# Patient Record
Sex: Female | Born: 1945
Health system: Southern US, Community
[De-identification: ages and names within clinical notes are randomized; demographics above are authoritative.]

## PROBLEM LIST (undated history)

## (undated) DIAGNOSIS — L900 Lichen sclerosus et atrophicus: Secondary | ICD-10-CM

## (undated) DIAGNOSIS — Z973 Presence of spectacles and contact lenses: Secondary | ICD-10-CM

## (undated) DIAGNOSIS — E785 Hyperlipidemia, unspecified: Secondary | ICD-10-CM

## (undated) DIAGNOSIS — M170 Bilateral primary osteoarthritis of knee: Secondary | ICD-10-CM

## (undated) DIAGNOSIS — I872 Venous insufficiency (chronic) (peripheral): Secondary | ICD-10-CM

## (undated) HISTORY — DX: Venous insufficiency (chronic) (peripheral): I87.2

## (undated) HISTORY — PX: JOINT REPLACEMENT: SHX530

## (undated) HISTORY — PX: VARICOSE VEIN SURGERY: SHX832

## (undated) HISTORY — DX: Hyperlipidemia, unspecified: E78.5

## (undated) HISTORY — PX: OTHER SURGICAL HISTORY: SHX169

---
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
## 1988-03-08 HISTORY — PX: DILATION AND CURETTAGE OF UTERUS: SHX78

## 2010-08-07 LAB — HM DEXA SCAN: HM Dexa Scan: NORMAL

## 2011-03-12 DIAGNOSIS — H5210 Myopia, unspecified eye: Secondary | ICD-10-CM | POA: Diagnosis not present

## 2011-04-12 DIAGNOSIS — H43819 Vitreous degeneration, unspecified eye: Secondary | ICD-10-CM | POA: Diagnosis not present

## 2011-05-07 HISTORY — PX: KNEE ARTHROSCOPY: SUR90

## 2011-05-12 DIAGNOSIS — L819 Disorder of pigmentation, unspecified: Secondary | ICD-10-CM | POA: Diagnosis not present

## 2011-05-12 DIAGNOSIS — D1800 Hemangioma unspecified site: Secondary | ICD-10-CM | POA: Diagnosis not present

## 2011-05-12 DIAGNOSIS — L821 Other seborrheic keratosis: Secondary | ICD-10-CM | POA: Diagnosis not present

## 2011-05-17 DIAGNOSIS — M23319 Other meniscus derangements, anterior horn of medial meniscus, unspecified knee: Secondary | ICD-10-CM | POA: Diagnosis not present

## 2011-05-17 DIAGNOSIS — IMO0002 Reserved for concepts with insufficient information to code with codable children: Secondary | ICD-10-CM | POA: Diagnosis not present

## 2011-05-17 DIAGNOSIS — M23329 Other meniscus derangements, posterior horn of medial meniscus, unspecified knee: Secondary | ICD-10-CM | POA: Diagnosis not present

## 2011-05-17 DIAGNOSIS — M1710 Unilateral primary osteoarthritis, unspecified knee: Secondary | ICD-10-CM | POA: Diagnosis not present

## 2011-05-26 DIAGNOSIS — Z0189 Encounter for other specified special examinations: Secondary | ICD-10-CM | POA: Diagnosis not present

## 2011-05-26 DIAGNOSIS — Z01419 Encounter for gynecological examination (general) (routine) without abnormal findings: Secondary | ICD-10-CM | POA: Diagnosis not present

## 2011-05-27 ENCOUNTER — Ambulatory Visit: Payer: Self-pay | Admitting: Orthopedic Surgery

## 2011-05-27 DIAGNOSIS — M23305 Other meniscus derangements, unspecified medial meniscus, unspecified knee: Secondary | ICD-10-CM | POA: Diagnosis not present

## 2011-05-27 DIAGNOSIS — Z79899 Other long term (current) drug therapy: Secondary | ICD-10-CM | POA: Diagnosis not present

## 2011-05-27 DIAGNOSIS — IMO0002 Reserved for concepts with insufficient information to code with codable children: Secondary | ICD-10-CM | POA: Diagnosis not present

## 2011-05-27 DIAGNOSIS — Z7982 Long term (current) use of aspirin: Secondary | ICD-10-CM | POA: Diagnosis not present

## 2011-05-27 DIAGNOSIS — Z8489 Family history of other specified conditions: Secondary | ICD-10-CM | POA: Diagnosis not present

## 2011-05-27 DIAGNOSIS — M23329 Other meniscus derangements, posterior horn of medial meniscus, unspecified knee: Secondary | ICD-10-CM | POA: Diagnosis not present

## 2011-06-15 DIAGNOSIS — Z1211 Encounter for screening for malignant neoplasm of colon: Secondary | ICD-10-CM | POA: Diagnosis not present

## 2011-06-18 DIAGNOSIS — Z01419 Encounter for gynecological examination (general) (routine) without abnormal findings: Secondary | ICD-10-CM | POA: Diagnosis not present

## 2011-06-22 ENCOUNTER — Ambulatory Visit: Payer: Self-pay | Admitting: Obstetrics and Gynecology

## 2011-06-22 DIAGNOSIS — Z1231 Encounter for screening mammogram for malignant neoplasm of breast: Secondary | ICD-10-CM | POA: Diagnosis not present

## 2011-11-15 DIAGNOSIS — L821 Other seborrheic keratosis: Secondary | ICD-10-CM | POA: Diagnosis not present

## 2011-12-01 DIAGNOSIS — Z23 Encounter for immunization: Secondary | ICD-10-CM | POA: Diagnosis not present

## 2012-02-06 HISTORY — PX: HAMMER TOE SURGERY: SHX385

## 2012-02-21 DIAGNOSIS — M2040 Other hammer toe(s) (acquired), unspecified foot: Secondary | ICD-10-CM | POA: Diagnosis not present

## 2012-02-23 DIAGNOSIS — M2040 Other hammer toe(s) (acquired), unspecified foot: Secondary | ICD-10-CM | POA: Diagnosis not present

## 2012-02-23 DIAGNOSIS — M216X9 Other acquired deformities of unspecified foot: Secondary | ICD-10-CM | POA: Diagnosis not present

## 2012-02-23 DIAGNOSIS — M21549 Acquired clubfoot, unspecified foot: Secondary | ICD-10-CM | POA: Diagnosis not present

## 2012-02-23 DIAGNOSIS — M129 Arthropathy, unspecified: Secondary | ICD-10-CM | POA: Diagnosis not present

## 2012-03-09 DIAGNOSIS — L609 Nail disorder, unspecified: Secondary | ICD-10-CM | POA: Diagnosis not present

## 2012-03-29 DIAGNOSIS — M2040 Other hammer toe(s) (acquired), unspecified foot: Secondary | ICD-10-CM | POA: Diagnosis not present

## 2012-03-29 DIAGNOSIS — Z79899 Other long term (current) drug therapy: Secondary | ICD-10-CM | POA: Diagnosis not present

## 2012-04-28 DIAGNOSIS — M2040 Other hammer toe(s) (acquired), unspecified foot: Secondary | ICD-10-CM | POA: Diagnosis not present

## 2012-06-06 DIAGNOSIS — L821 Other seborrheic keratosis: Secondary | ICD-10-CM | POA: Diagnosis not present

## 2012-06-06 DIAGNOSIS — D485 Neoplasm of uncertain behavior of skin: Secondary | ICD-10-CM | POA: Diagnosis not present

## 2012-06-06 DIAGNOSIS — L819 Disorder of pigmentation, unspecified: Secondary | ICD-10-CM | POA: Diagnosis not present

## 2012-06-06 DIAGNOSIS — L578 Other skin changes due to chronic exposure to nonionizing radiation: Secondary | ICD-10-CM | POA: Diagnosis not present

## 2012-06-06 DIAGNOSIS — I789 Disease of capillaries, unspecified: Secondary | ICD-10-CM | POA: Diagnosis not present

## 2012-06-06 DIAGNOSIS — L219 Seborrheic dermatitis, unspecified: Secondary | ICD-10-CM | POA: Diagnosis not present

## 2012-06-06 DIAGNOSIS — L723 Sebaceous cyst: Secondary | ICD-10-CM | POA: Diagnosis not present

## 2012-06-29 DIAGNOSIS — M2040 Other hammer toe(s) (acquired), unspecified foot: Secondary | ICD-10-CM | POA: Diagnosis not present

## 2012-07-06 LAB — FECAL OCCULT BLOOD, GUAIAC: Fecal Occult Blood: NEGATIVE

## 2012-07-11 DIAGNOSIS — Z1331 Encounter for screening for depression: Secondary | ICD-10-CM | POA: Diagnosis not present

## 2012-07-11 DIAGNOSIS — Z01419 Encounter for gynecological examination (general) (routine) without abnormal findings: Secondary | ICD-10-CM | POA: Diagnosis not present

## 2012-07-11 DIAGNOSIS — Z124 Encounter for screening for malignant neoplasm of cervix: Secondary | ICD-10-CM | POA: Diagnosis not present

## 2012-07-14 DIAGNOSIS — Z0189 Encounter for other specified special examinations: Secondary | ICD-10-CM | POA: Diagnosis not present

## 2012-07-20 DIAGNOSIS — Z1211 Encounter for screening for malignant neoplasm of colon: Secondary | ICD-10-CM | POA: Diagnosis not present

## 2012-08-09 ENCOUNTER — Ambulatory Visit: Payer: Self-pay | Admitting: Obstetrics and Gynecology

## 2012-08-09 DIAGNOSIS — Z1231 Encounter for screening mammogram for malignant neoplasm of breast: Secondary | ICD-10-CM | POA: Diagnosis not present

## 2012-08-22 DIAGNOSIS — L819 Disorder of pigmentation, unspecified: Secondary | ICD-10-CM | POA: Diagnosis not present

## 2012-08-22 DIAGNOSIS — L820 Inflamed seborrheic keratosis: Secondary | ICD-10-CM | POA: Diagnosis not present

## 2012-08-22 DIAGNOSIS — D485 Neoplasm of uncertain behavior of skin: Secondary | ICD-10-CM | POA: Diagnosis not present

## 2012-08-22 DIAGNOSIS — D239 Other benign neoplasm of skin, unspecified: Secondary | ICD-10-CM | POA: Diagnosis not present

## 2012-08-22 DIAGNOSIS — L578 Other skin changes due to chronic exposure to nonionizing radiation: Secondary | ICD-10-CM | POA: Diagnosis not present

## 2012-08-22 DIAGNOSIS — L821 Other seborrheic keratosis: Secondary | ICD-10-CM | POA: Diagnosis not present

## 2012-09-25 DIAGNOSIS — D509 Iron deficiency anemia, unspecified: Secondary | ICD-10-CM | POA: Diagnosis not present

## 2012-10-06 HISTORY — PX: OTHER SURGICAL HISTORY: SHX169

## 2012-11-28 ENCOUNTER — Encounter: Payer: Self-pay | Admitting: Internal Medicine

## 2012-11-28 ENCOUNTER — Ambulatory Visit (INDEPENDENT_AMBULATORY_CARE_PROVIDER_SITE_OTHER): Payer: Medicare Other | Admitting: Internal Medicine

## 2012-11-28 VITALS — BP 128/80 | HR 61 | Temp 97.9°F | Ht 62.5 in | Wt 135.0 lb

## 2012-11-28 DIAGNOSIS — I872 Venous insufficiency (chronic) (peripheral): Secondary | ICD-10-CM

## 2012-11-28 DIAGNOSIS — I83812 Varicose veins of left lower extremities with pain: Secondary | ICD-10-CM | POA: Insufficient documentation

## 2012-11-28 DIAGNOSIS — Z23 Encounter for immunization: Secondary | ICD-10-CM

## 2012-11-28 DIAGNOSIS — E785 Hyperlipidemia, unspecified: Secondary | ICD-10-CM | POA: Diagnosis not present

## 2012-11-28 DIAGNOSIS — I8390 Asymptomatic varicose veins of unspecified lower extremity: Secondary | ICD-10-CM | POA: Insufficient documentation

## 2012-11-28 NOTE — Addendum Note (Signed)
Addended by: Sueanne Margarita on: 11/28/2012 01:02 PM   Modules accepted: Orders

## 2012-11-28 NOTE — Progress Notes (Signed)
Subjective:    Patient ID: Jessica Sullivan, female    DOB: 09/07/1945, 67 y.o.   MRN: [id number]  HPI Establishing care Moved here recently from [place] is professor at [employer]  Had arthroscopy on right knee for torn meniscus by Dr Rosita Kea-- 2013 Hammer toe repair on left by Dr Marcell Anger and neck lift-- Dr Benna Dunks plastic surgeon in [facility] 8/14  Sees Dr Arvella Merles Did pap and breast exam Mammogram in May was normal  Has been working on exercise and diet Has lost 12# recently Mild high cholesterol noted  Varicose veins stripped 3 times  Has a new bump just above right knee Slightly tender Does have support hose  No current outpatient prescriptions on file prior to visit.   No current facility-administered medications on file prior to visit.    No Known Allergies  Past Medical History  Diagnosis Date  . Chronic venous insufficiency   . Hyperlipidemia     Past Surgical History  Procedure Laterality Date  . Chin lift  8/14  . Knee arthroscopy Right 3/13    meniscus  . Cesarean section  1978  . Varicose vein surgery  2004, 2011  . Hammer toe surgery  12/13    Family History  Problem Relation Age of Onset  . Dementia Mother   . Cancer Father   . Heart disease Neg Hx   . Diabetes Neg Hx   . Cancer Brother     prostate/prostate  . Dementia Brother     corticobasilar degeneration    History   Social History  . Marital Status: Married    Spouse Name: N/A    Number of Children: 1  . Years of Education: N/A   Occupational History  . 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 Smoking status: Never Smoker   . Smokeless tobacco: Never Used  . Alcohol Use: Yes     Comment: 2 drinks per week  . Drug Use: No  . Sexual Activity: Not on file   Other Topics Concern  . Not on file   Social History Narrative   Has living will   Husband is health care POA   Would accept  resuscitation attempts but no prolonged artificial ventilation   Not sure about tube feeds   Review of Systems  Constitutional: Negative for fatigue and unexpected weight change.       Walks daily  HENT: Negative for hearing loss, dental problem and tinnitus.   Eyes: Negative for visual disturbance.       Regular eye exams Very early cataracts  Respiratory: Negative for cough, chest tightness and shortness of breath.   Cardiovascular: Negative for chest pain, palpitations and leg swelling.  Gastrointestinal: Negative for nausea, vomiting, constipation and blood in stool.  Endocrine: Negative for cold intolerance and heat intolerance.  Genitourinary: Negative for dysuria and difficulty urinating.  Musculoskeletal: Positive for arthralgias. Negative for back pain and joint swelling.       Mild knee arthritis--no meds  Skin: Negative for rash.       Sees Dr Gwen Pounds yearly  Neurological: Negative for dizziness, syncope, light-headedness and headaches.  Hematological: Negative for adenopathy. Does not bruise/bleed easily.  Psychiatric/Behavioral: Positive for sleep disturbance. Negative for dysphoric mood. The patient is not nervous/anxious.        Sleeps okay but goes to sleep early on couch--then up early  Objective:   Physical Exam  Constitutional: She appears well-developed and well-nourished. No distress.  HENT:  Mouth/Throat: Oropharynx is clear and moist. No oropharyngeal exudate.  Neck: Normal range of motion. Neck supple. No thyromegaly present.  Cardiovascular: Normal rate, regular rhythm, normal heart sounds and intact distal pulses.  Exam reveals no gallop.   No murmur heard. Pulmonary/Chest: Effort normal and breath sounds normal. No respiratory distress. She has no wheezes. She has no rales.  Abdominal: Soft. There is no tenderness.  Musculoskeletal: She exhibits no edema and no tenderness.  Mild superficial varicosities--esp just above right knee   Lymphadenopathy:    She has no cervical adenopathy.  Skin: No rash noted.  Psychiatric: She has a normal mood and affect. Her behavior is normal.          Assessment & Plan:

## 2012-11-28 NOTE — Assessment & Plan Note (Signed)
Mild varicosities but no swelling since vein stripping Discussed using support hose

## 2012-11-28 NOTE — Assessment & Plan Note (Signed)
Mild apparently She will get me her recent results Discussed and will stop all the other supplements

## 2012-12-01 DIAGNOSIS — M2040 Other hammer toe(s) (acquired), unspecified foot: Secondary | ICD-10-CM | POA: Diagnosis not present

## 2012-12-07 ENCOUNTER — Encounter: Payer: Self-pay | Admitting: Podiatrist

## 2012-12-15 DIAGNOSIS — B351 Tinea unguium: Secondary | ICD-10-CM

## 2012-12-26 ENCOUNTER — Telehealth: Payer: Self-pay | Admitting: *Deleted

## 2012-12-26 NOTE — Telephone Encounter (Signed)
Pt states Dr Irving Shows cut a tendon in the office, but her hammer toe still has not gone down.  Pt asked if could schedule appt in [place] to discuss other options.  Return call informed pt that Dr Irving Shows would be happy to see her in [place].

## 2012-12-28 NOTE — Telephone Encounter (Signed)
Dr Irving Shows acknowledged pt's request for [facility] appt.

## 2013-01-04 ENCOUNTER — Ambulatory Visit (INDEPENDENT_AMBULATORY_CARE_PROVIDER_SITE_OTHER): Payer: Medicare Other | Admitting: Podiatrist

## 2013-01-04 ENCOUNTER — Encounter: Payer: Self-pay | Admitting: Podiatrist

## 2013-01-04 VITALS — BP 111/75 | HR 67 | Resp 16 | Ht 62.0 in | Wt 133.0 lb

## 2013-01-04 DIAGNOSIS — M2040 Other hammer toe(s) (acquired), unspecified foot: Secondary | ICD-10-CM

## 2013-01-04 DIAGNOSIS — M2042 Other hammer toe(s) (acquired), left foot: Secondary | ICD-10-CM

## 2013-01-04 NOTE — Progress Notes (Signed)
Subjective: Paraskevi presents today for followup of second toe left foot. Patient states it continues to float and be higher than the other toes and that the toe bumps against her shoes and causes pain. We have tried an extensor tendon release and her toe continues to float  Objective: Neurovascular status is intact and unchanged to the left foot. Upon standing the left second toe does elevate in comparison with the first and the third .  The toe does appear straight however it is elevated at the metatarsophalangeal joint.  Assessment: Status post hammertoe repair with dorsal contracture at metatarsophalangeal joint Plan: Discussed that since that simple tendon release did not work, I would recommend a flexor tendon transfer. Patient wishes to have this performed and would like to have this performed under local anesthesia. Because local anesthesia is acceptable to her I recommended performing the surgery and our [facility] office location surgical suite. We will set her up for this procedure the week after Thanksgiving and she will bring her Darco shoe with her. Consent forms will be signed at that time. If the patient has any concerns prior to that visit she is instructed to call immediately.

## 2013-01-25 ENCOUNTER — Telehealth: Payer: Self-pay | Admitting: *Deleted

## 2013-01-25 NOTE — Telephone Encounter (Signed)
Dr Irving Shows had request to change pt's surgery to [facility] on 02/07/2013, due to necessary equipment not being available in the [facility].  Dr Irving Shows states can still be under local anesthesia and on the same day.  Pt agreed.

## 2013-02-07 ENCOUNTER — Encounter: Payer: Self-pay | Admitting: Podiatrist

## 2013-02-07 DIAGNOSIS — M2040 Other hammer toe(s) (acquired), unspecified foot: Secondary | ICD-10-CM

## 2013-02-07 DIAGNOSIS — M6240 Contracture of muscle, unspecified site: Secondary | ICD-10-CM

## 2013-02-07 NOTE — Progress Notes (Signed)
Brief Post Op Note:  02/07/2013 PODIATRY POST OPERATIVE NOTE  SURGEON: Marlowe Aschoff, DPM  ASSISTANT: none  PRE-OP DIAGNOSIS: elevated 2nd toe Left at MPJ  POST-OP DIAGNOSIS: same  PROCEDURE: flexor to extensor tendon repair with MPJ release  ANESTHESIA: Local Only  HEMOSTASIS: PAT  ESTIMATED BLOOD LOSS: minimal   MATERIALS: suture  INJECTABLES: none  COMPLICATIONS: none  PATHOLOGY: none  CONDITION: stable  MPJ release at 2nd mpj left 2nd toe with flexor to extensor tendon transfer performed at [facility] under local anesthesia only.  Patient instructed to wear wedge shoe for 1 week.  She wishes to take aleve for post op pain.  Will be seen for her post op follow up in 1 week   EGERTON, KATHRYN P 02/07/2013,2:16 PM

## 2013-02-07 NOTE — Progress Notes (Deleted)
Subjective:     Patient ID: Jessica Sullivan, female   DOB: 1945/11/06, 67 y.o.   MRN: [id number]  HPI   Review of Systems     Objective:   Physical Exam     Assessment:     ***    Plan:     ***

## 2013-02-15 ENCOUNTER — Ambulatory Visit: Payer: Medicare Other | Admitting: Podiatrist

## 2013-02-15 ENCOUNTER — Encounter: Payer: Self-pay | Admitting: Podiatrist

## 2013-02-15 VITALS — BP 125/75 | HR 84 | Resp 16

## 2013-02-15 DIAGNOSIS — M2040 Other hammer toe(s) (acquired), unspecified foot: Secondary | ICD-10-CM

## 2013-02-15 DIAGNOSIS — Z9889 Other specified postprocedural states: Secondary | ICD-10-CM

## 2013-02-15 DIAGNOSIS — M2042 Other hammer toe(s) (acquired), left foot: Secondary | ICD-10-CM

## 2013-02-15 NOTE — Progress Notes (Signed)
   Subjective: Jessica Sullivan presents today 9 days status post flexor to extensor tendon repair and release of metatarsophalangeal joint on the left second toe. She states it's doing okay and she's had more pain after the surgery than she did the initial surgery. She still didn't require any pain medication but she just noticed more discomfort. She denies any nausea, vomiting, fevers, or chills. Been wearing her Darco shoe as instructed.  Objective: Excellent appearance of the second toe is seen. It is in good alignment with the first and the third toe in the transverse plane. When she plantar flexes the toe it still elevates some above the first and the third but is much improved from preoperatively.  Sutures are in place and removed at today's visit. Well-healing surgical incision line is present both dorsally and plantarly.  The patient's left hallux nail still appears mycotic especially along the lateral nail border.  Assessment: Status post flexor to extensor tendon transfer left second toe date of surgery 02/07/2013. Onychomycosis left hallux nail  Plan: Remove the dressing and remove the sutures at today's visit. [place] with a new dry sterile compressive dressing and instructed her that she may begin to get it wet tomorrow. Dispensed a Darco splint and instructed her to continue wearing this to plantarflex the second toe in its healedl position. Re\re-lasered the left hallux nail without complication. I will see her back in 3-4 weeks for followup on the toe and we will laser again the left hallux nail. The right hallux nail did great with the laser therapy of the left was taking a little bit more time  Marlowe Aschoff DPM

## 2013-03-16 ENCOUNTER — Ambulatory Visit (INDEPENDENT_AMBULATORY_CARE_PROVIDER_SITE_OTHER): Payer: Medicare Other | Admitting: Podiatrist

## 2013-03-16 ENCOUNTER — Encounter: Payer: Self-pay | Admitting: Podiatrist

## 2013-03-16 ENCOUNTER — Ambulatory Visit (INDEPENDENT_AMBULATORY_CARE_PROVIDER_SITE_OTHER): Payer: Medicare Other

## 2013-03-16 VITALS — BP 132/76 | HR 60 | Resp 12

## 2013-03-16 DIAGNOSIS — Z9889 Other specified postprocedural states: Secondary | ICD-10-CM

## 2013-03-16 DIAGNOSIS — B351 Tinea unguium: Secondary | ICD-10-CM

## 2013-03-16 DIAGNOSIS — M2040 Other hammer toe(s) (acquired), unspecified foot: Secondary | ICD-10-CM

## 2013-03-16 DIAGNOSIS — M2042 Other hammer toe(s) (acquired), left foot: Secondary | ICD-10-CM

## 2013-03-16 NOTE — Patient Instructions (Signed)
Work on strengthening the muscle that pulls your 2nd toe down.  Actively flex your toe downward as much as you can and hold for 30 seconds.  Repeat this 10 times and perform the exercise 3 times a day.  Wear the paper tape to bring the toe down while you are wearing shoes during the day.     Give it another month or 2 and if the toe or toenail aren't cooperating, let me know.

## 2013-03-19 NOTE — Progress Notes (Signed)
1) Tendon repair 2nd toe left

## 2013-03-20 NOTE — Progress Notes (Signed)
Subjective: Patient presents today for her postop followup visit regarding the left second toe status post flexor to extensor tendon transfer in order to try and plantar flex the toe after the original hammertoe surgery. Also for re lasering of left hallux nail.  Objective: Neurovascular status is intact. Left second toe is in alignment with the first and the third with nonweightbearing. Upon standing it does still float above the first and third but is much improved from prior to surgery. She is able to flex the toe with her own power.  Left hallux nail continues to be thick and dystrophic. Left second toenail is dark and dystrophic as well. This is most likely from the nail hitting her shoe.  Assessment: Status post hammertoe surgery left, flexor to extensor tendon repair, onychomycosis left hallux  Plan: Gave patient instructions for strengthening the power of the flexor tendon in order for it to pull down the second toe in a more permanent fashion. Also recommended that she keep taping the toe down as long as she is able. Also at today's visit re\re lasered the left hallux nail and the left second toenail without complication. She will be seen back for recheck of this foot and if any problems arise she will call

## 2013-06-07 DIAGNOSIS — L570 Actinic keratosis: Secondary | ICD-10-CM | POA: Diagnosis not present

## 2013-06-07 DIAGNOSIS — L821 Other seborrheic keratosis: Secondary | ICD-10-CM | POA: Diagnosis not present

## 2013-06-07 DIAGNOSIS — L578 Other skin changes due to chronic exposure to nonionizing radiation: Secondary | ICD-10-CM | POA: Diagnosis not present

## 2013-06-07 DIAGNOSIS — Z86018 Personal history of other benign neoplasm: Secondary | ICD-10-CM

## 2013-06-07 DIAGNOSIS — I8310 Varicose veins of unspecified lower extremity with inflammation: Secondary | ICD-10-CM | POA: Diagnosis not present

## 2013-06-07 DIAGNOSIS — D485 Neoplasm of uncertain behavior of skin: Secondary | ICD-10-CM | POA: Diagnosis not present

## 2013-06-07 DIAGNOSIS — D239 Other benign neoplasm of skin, unspecified: Secondary | ICD-10-CM | POA: Diagnosis not present

## 2013-06-07 HISTORY — DX: Personal history of other benign neoplasm: Z86.018

## 2013-06-15 ENCOUNTER — Encounter: Payer: Self-pay | Admitting: Internal Medicine

## 2013-06-15 ENCOUNTER — Ambulatory Visit (INDEPENDENT_AMBULATORY_CARE_PROVIDER_SITE_OTHER): Payer: Medicare Other | Admitting: Internal Medicine

## 2013-06-15 VITALS — BP 118/70 | HR 76 | Temp 98.5°F | Wt 138.0 lb

## 2013-06-15 DIAGNOSIS — I83893 Varicose veins of bilateral lower extremities with other complications: Secondary | ICD-10-CM

## 2013-06-15 DIAGNOSIS — I83812 Varicose veins of left lower extremities with pain: Secondary | ICD-10-CM

## 2013-06-15 NOTE — Assessment & Plan Note (Signed)
Will go ahead with referral to [facility]

## 2013-06-15 NOTE — Progress Notes (Signed)
   Subjective:    Patient ID: Jessica Sullivan, female    DOB: [birth year], 68 y.o.   MRN: [id number]  HPI Saw dermatologist Dr Nehemiah Massed last week Noted the veins Has some pain in the legs especially at night Would like to consider treatment again  Legs don't swell too much Has tried support hose intermittently--- doesn't seem to have helped  Current Outpatient Prescriptions on File Prior to Visit  Medication Sig Dispense Refill  . Ascorbic Acid (VITAMIN C PO) Take by mouth daily.      Marland Kitchen aspirin 81 MG tablet Take 81 mg by mouth daily.      Marland Kitchen BIOTIN PO Take by mouth daily.      . Calcium Carbonate-Vitamin D (CALCIUM + D PO) Take by mouth 2 (two) times daily.      . Glucosamine HCl (GLUCOSAMINE PO) Take by mouth daily.      . Multiple Vitamins-Minerals (CENTRUM SILVER ADULT 50+) TABS Take 1 tablet by mouth daily.      . Omega-3 Fatty Acids (FISH OIL PO) Take by mouth daily.      Marland Kitchen Resveratrol 250 MG CAPS Take 2 capsules by mouth daily.      Marland Kitchen VITAMIN E PO Take by mouth daily.       No current facility-administered medications on file prior to visit.    No Known Allergies  Past Medical History  Diagnosis Date  . Chronic venous insufficiency   . Hyperlipidemia     Past Surgical History  Procedure Laterality Date  . Chin lift  8/14  . Knee arthroscopy Right 3/13    meniscus  . Cesarean section  1978  . Varicose vein surgery  2004, 2011  . Hammer toe surgery  12/13  . Mpj release 2nd toe left      Family History  Problem Relation Age of Onset  . Dementia Mother   . Cancer Father   . Heart disease Neg Hx   . Diabetes Neg Hx   . Cancer Brother     prostate/prostate  . Dementia Brother     corticobasilar degeneration    History   Social History  . Marital Status: Married    Spouse Name: N/A    Number of Children: 1  . Years of Education: N/A   Occupational History  . 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 Smoking status: Never Smoker   . Smokeless tobacco: Never Used  . Alcohol Use: Yes     Comment: 2 drinks per week  . Drug Use: No  . Sexual Activity: Not on file   Other Topics Concern  . Not on file   Social History Narrative   Has living will   Husband is health care POA   Would accept resuscitation attempts but no prolonged artificial ventilation   Not sure about tube feeds   Review of Systems No dyspnea weight is up a few pounds    Objective:   Physical Exam  Skin:  Prominent veins on right calf and lower thigh Mild tenderness          Assessment & Plan:

## 2013-06-15 NOTE — Progress Notes (Signed)
Pre visit review using our clinic review tool, if applicable. No additional management support is needed unless otherwise documented below in the visit note. 

## 2013-06-29 DIAGNOSIS — I8310 Varicose veins of unspecified lower extremity with inflammation: Secondary | ICD-10-CM | POA: Diagnosis not present

## 2013-06-29 DIAGNOSIS — M79609 Pain in unspecified limb: Secondary | ICD-10-CM | POA: Diagnosis not present

## 2013-07-06 LAB — HM PAP SMEAR: HM PAP: NORMAL

## 2013-07-06 LAB — HM MAMMOGRAPHY: HM Mammogram: NORMAL

## 2013-07-16 ENCOUNTER — Ambulatory Visit (INDEPENDENT_AMBULATORY_CARE_PROVIDER_SITE_OTHER): Payer: Medicare Other | Admitting: Internal Medicine

## 2013-07-16 ENCOUNTER — Encounter: Payer: Self-pay | Admitting: Internal Medicine

## 2013-07-16 VITALS — BP 120/78 | HR 78 | Temp 98.1°F | Resp 14 | Wt 140.0 lb

## 2013-07-16 DIAGNOSIS — J209 Acute bronchitis, unspecified: Secondary | ICD-10-CM

## 2013-07-16 MED ORDER — AMOXICILLIN 500 MG PO TABS: 1000.0000 mg | ORAL_TABLET | Freq: Two times a day (BID) | ORAL | Status: DC

## 2013-07-16 NOTE — Progress Notes (Signed)
Subjective:    Patient ID: Jessica Sullivan, female    DOB: 1946-03-01, 69 y.o.   MRN: [id number]  HPI Son concerned about her cough--heard it yesterday Had dry cough and now wet to some degree Was sick last week but seemed to improve Recent air travel  No fever No SOB No clear PND--but some teeth pain and sinus headache last week Sore throat last week---better now (dry and scratchy) No ear pain---but pressure on plane  Tried dayquil and nyquil---then started mucinex The increased mucus did start after that  Current Outpatient Prescriptions on File Prior to Visit  Medication Sig Dispense Refill  . Ascorbic Acid (VITAMIN C PO) Take by mouth daily.      Marland Kitchen aspirin 81 MG tablet Take 81 mg by mouth daily.      Marland Kitchen BIOTIN PO Take by mouth daily.      . Calcium Carbonate-Vitamin D (CALCIUM + D PO) Take by mouth 2 (two) times daily.      . Glucosamine HCl (GLUCOSAMINE PO) Take by mouth daily.      . Multiple Vitamins-Minerals (CENTRUM SILVER ADULT 50+) TABS Take 1 tablet by mouth daily.      . Omega-3 Fatty Acids (FISH OIL PO) Take by mouth daily.      Marland Kitchen Resveratrol 250 MG CAPS Take 2 capsules by mouth daily.      Marland Kitchen VITAMIN E PO Take by mouth daily.       No current facility-administered medications on file prior to visit.    No Known Allergies  Past Medical History  Diagnosis Date  . Chronic venous insufficiency   . Hyperlipidemia     Past Surgical History  Procedure Laterality Date  . Chin lift  8/14  . Knee arthroscopy Right 3/13    meniscus  . Cesarean section  1978  . Varicose vein surgery  2004, 2011  . Hammer toe surgery  12/13  . Mpj release 2nd toe left      Family History  Problem Relation Age of Onset  . Dementia Mother   . Cancer Father   . Heart disease Neg Hx   . Diabetes Neg Hx   . Cancer Brother     prostate/prostate  . Dementia Brother     corticobasilar degeneration    History   Social History  . Marital Status: Married    Spouse Name: N/A      Number of Children: 1  . Years of Education: N/A   Occupational History  . 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 Smoking status: Never Smoker   . Smokeless tobacco: Never Used  . Alcohol Use: Yes     Comment: 2 drinks per week  . Drug Use: No  . Sexual Activity: Not on file   Other Topics Concern  . Not on file   Social History Narrative   Has living will   Husband is health care POA   Would accept resuscitation attempts but no prolonged artificial ventilation   Not sure about tube feeds   Review of Systems No rash No vomiting or diarrhea Appetite is okay    Objective:   Physical Exam  Constitutional: She appears well-developed and well-nourished. No distress.  HENT:  Mouth/Throat: Oropharynx is clear and moist. No oropharyngeal exudate.  No sinus tenderness Mild nasal congestion TMs normal   Neck: Normal range of motion. Neck supple. No thyromegaly  present.  Pulmonary/Chest: Effort normal and breath sounds normal. No respiratory distress. She has no wheezes. She has no rales.  Lymphadenopathy:    She has no cervical adenopathy.  Skin: No rash noted.          Assessment & Plan:

## 2013-07-16 NOTE — Assessment & Plan Note (Signed)
Had more sinus symptoms last week Now with bronchitis---clear mucus. This could be from the mucinex She will try off the mucinex---if continues or worsens, will fill the amoxil

## 2013-07-16 NOTE — Progress Notes (Signed)
Pre visit review using our clinic review tool, if applicable. No additional management support is needed unless otherwise documented below in the visit note. 

## 2013-07-20 ENCOUNTER — Telehealth: Payer: Self-pay | Admitting: Podiatrist

## 2013-07-20 ENCOUNTER — Other Ambulatory Visit: Payer: Self-pay | Admitting: Podiatrist

## 2013-07-20 MED ORDER — EFINACONAZOLE 10 % EX SOLN: 1.0000 [drp] | Freq: Every day | CUTANEOUS | Status: DC

## 2013-07-20 NOTE — Telephone Encounter (Signed)
Left a message for the patient that Dr. Valentina Lucks will prescribe her Jublia. If she has any questions to please contact our office.

## 2013-07-20 NOTE — Telephone Encounter (Signed)
Yes-- i will call in a topical for her to use and they will mail it to her-- its Jublia from [facility]

## 2013-08-21 ENCOUNTER — Encounter: Payer: Self-pay | Admitting: Internal Medicine

## 2013-08-21 ENCOUNTER — Ambulatory Visit: Payer: Self-pay | Admitting: Internal Medicine

## 2013-08-21 DIAGNOSIS — Z1231 Encounter for screening mammogram for malignant neoplasm of breast: Secondary | ICD-10-CM | POA: Diagnosis not present

## 2013-10-02 DIAGNOSIS — I8310 Varicose veins of unspecified lower extremity with inflammation: Secondary | ICD-10-CM | POA: Diagnosis not present

## 2013-10-26 DIAGNOSIS — I8310 Varicose veins of unspecified lower extremity with inflammation: Secondary | ICD-10-CM | POA: Diagnosis not present

## 2013-10-29 DIAGNOSIS — I8310 Varicose veins of unspecified lower extremity with inflammation: Secondary | ICD-10-CM | POA: Diagnosis not present

## 2013-11-30 DIAGNOSIS — I8310 Varicose veins of unspecified lower extremity with inflammation: Secondary | ICD-10-CM | POA: Diagnosis not present

## 2013-12-04 ENCOUNTER — Ambulatory Visit (INDEPENDENT_AMBULATORY_CARE_PROVIDER_SITE_OTHER): Payer: Medicare Other | Admitting: Internal Medicine

## 2013-12-04 ENCOUNTER — Encounter: Payer: Self-pay | Admitting: Internal Medicine

## 2013-12-04 VITALS — BP 134/84 | HR 65 | Temp 98.3°F | Resp 14 | Ht 62.5 in | Wt 140.2 lb

## 2013-12-04 DIAGNOSIS — E785 Hyperlipidemia, unspecified: Secondary | ICD-10-CM | POA: Diagnosis not present

## 2013-12-04 DIAGNOSIS — I83812 Varicose veins of left lower extremities with pain: Secondary | ICD-10-CM

## 2013-12-04 DIAGNOSIS — Z23 Encounter for immunization: Secondary | ICD-10-CM

## 2013-12-04 DIAGNOSIS — Z1211 Encounter for screening for malignant neoplasm of colon: Secondary | ICD-10-CM | POA: Diagnosis not present

## 2013-12-04 DIAGNOSIS — Z0000 Encounter for general adult medical examination without abnormal findings: Secondary | ICD-10-CM | POA: Insufficient documentation

## 2013-12-04 DIAGNOSIS — I83893 Varicose veins of bilateral lower extremities with other complications: Secondary | ICD-10-CM | POA: Diagnosis not present

## 2013-12-04 LAB — CBC WITH DIFFERENTIAL/PLATELET
Basophils Absolute: 0 10*3/uL (ref 0.0–0.1)
Basophils Relative: 0.7 % (ref 0.0–3.0)
EOS ABS: 0.1 10*3/uL (ref 0.0–0.7)
Eosinophils Relative: 1.3 % (ref 0.0–5.0)
HCT: 40.4 % (ref 36.0–46.0)
Hemoglobin: 13.8 g/dL (ref 12.0–15.0)
Lymphocytes Relative: 31.5 % (ref 12.0–46.0)
Lymphs Abs: 1.8 10*3/uL (ref 0.7–4.0)
MCHC: 34.1 g/dL (ref 30.0–36.0)
MCV: 88.6 fl (ref 78.0–100.0)
MONO ABS: 0.4 10*3/uL (ref 0.1–1.0)
Monocytes Relative: 7.5 % (ref 3.0–12.0)
Neutro Abs: 3.3 10*3/uL (ref 1.4–7.7)
Neutrophils Relative %: 59 % (ref 43.0–77.0)
PLATELETS: 243 10*3/uL (ref 150.0–400.0)
RBC: 4.56 Mil/uL (ref 3.87–5.11)
RDW: 13.7 % (ref 11.5–15.5)
WBC: 5.6 10*3/uL (ref 4.0–10.5)

## 2013-12-04 LAB — COMPREHENSIVE METABOLIC PANEL
ALBUMIN: 4 g/dL (ref 3.5–5.2)
ALK PHOS: 85 U/L (ref 39–117)
ALT: 21 U/L (ref 0–35)
AST: 17 U/L (ref 0–37)
BUN: 26 mg/dL — AB (ref 6–23)
CHLORIDE: 104 meq/L (ref 96–112)
CO2: 27 meq/L (ref 19–32)
Calcium: 9.1 mg/dL (ref 8.4–10.5)
Creatinine, Ser: 0.7 mg/dL (ref 0.4–1.2)
GFR: 88.5 mL/min (ref >60.00)
GLUCOSE: 97 mg/dL (ref 70–99)
Potassium: 3.6 mEq/L (ref 3.5–5.1)
Sodium: 138 mEq/L (ref 135–145)
TOTAL PROTEIN: 7.1 g/dL (ref 6.0–8.3)
Total Bilirubin: 0.6 mg/dL (ref 0.2–1.2)

## 2013-12-04 LAB — T4, FREE: FREE T4: 1.08 ng/dL (ref 0.60–1.60)

## 2013-12-04 LAB — LIPID PANEL
CHOLESTEROL: 202 mg/dL — AB (ref 0–200)
HDL: 61.7 mg/dL (ref >39.00)
LDL CALC: 123 mg/dL — AB (ref 0–99)
NONHDL: 140.3
Total CHOL/HDL Ratio: 3
Triglycerides: 89 mg/dL (ref 0.0–149.0)
VLDL: 17.8 mg/dL (ref 0.0–40.0)

## 2013-12-04 NOTE — Assessment & Plan Note (Signed)
I have personally reviewed the Medicare Annual Wellness questionnaire and have noted 1. The patient's medical and social history 2. Their use of alcohol, tobacco or illicit drugs 3. Their current medications and supplements 4. The patient's functional ability including ADL's, fall risks, home safety risks and hearing or visual             impairment. 5. Diet and physical activities 6. Evidence for depression or mood disorders  The patients weight, height, BMI and visual acuity have been recorded in the chart I have made referrals, counseling and provided education to the patient based review of the above and I have provided the pt with a written personalized care plan for preventive services.  I have provided you with a copy of your personalized plan for preventive services. Please take the time to review along with your updated medication list.  Doing well Prevnar and flu today Mammogram every 1-2 years (discussed that every 2 is okay) No paps due to age Will do fecal immunoassay

## 2013-12-04 NOTE — Progress Notes (Signed)
Subjective:    Patient ID: Jessica Sullivan, female    DOB: 1945/10/10, 68 y.o.   MRN: [id number]  HPI Here for first Medicare wellness and follow up Reviewed form and advanced directives No falls No depression or anhedonia Sees Dr Lucky Cowboy for ongoing vascular issues in her legs. Yearly derm visit-- [facility]. Eye doctor and dentist ([place]) Occasional alcohol. No tobacco. Independent in instrumental ADLs Walks regularly Vision and hearing are fine No problems with memory  Ongoing visits with Dr Lucky Cowboy for her varicosities Happy with the laser Rx Will need further procedures to finish the work  Still on fish oil Takes the vitamin E for this also---discussed that there is controversy about this She will reconsider this Not interested in statin  Current Outpatient Prescriptions on File Prior to Visit  Medication Sig Dispense Refill  . Ascorbic Acid (VITAMIN C PO) Take by mouth daily.      Marland Kitchen aspirin 81 MG tablet Take 81 mg by mouth daily.      Marland Kitchen BIOTIN PO Take by mouth daily.      . Calcium Carbonate-Vitamin D (CALCIUM + D PO) Take by mouth 2 (two) times daily.      . Efinaconazole 10 % SOLN Apply 1 drop topically daily.  8 mL  3  . Glucosamine HCl (GLUCOSAMINE PO) Take by mouth daily.      . Multiple Vitamins-Minerals (CENTRUM SILVER ADULT 50+) TABS Take 1 tablet by mouth daily.      . Omega-3 Fatty Acids (FISH OIL PO) Take by mouth daily.      Marland Kitchen Resveratrol 250 MG CAPS Take 2 capsules by mouth daily.      Marland Kitchen VITAMIN E PO Take by mouth daily.       No current facility-administered medications on file prior to visit.    No Known Allergies  Past Medical History  Diagnosis Date  . Chronic venous insufficiency   . Hyperlipidemia     Past Surgical History  Procedure Laterality Date  . Chin lift  8/14  . Knee arthroscopy Right 3/13    meniscus  . Cesarean section  1978  . Varicose vein surgery  2004, 2011  . Hammer toe surgery  12/13  . Mpj release 2nd toe left        Family History  Problem Relation Age of Onset  . Dementia Mother   . Cancer Father   . Heart disease Neg Hx   . Diabetes Neg Hx   . Cancer Brother     prostate/prostate  . Dementia Brother     corticobasilar degeneration    History   Social History  . Marital Status: Married    Spouse Name: N/A    Number of Children: 1  . Years of Education: N/A   Occupational History  . 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 Smoking status: Never Smoker   . Smokeless tobacco: Never Used  . Alcohol Use: Yes     Comment: 2 drinks per week  . Drug Use: No  . Sexual Activity: Not on file   Other Topics Concern  . Not on file   Social History Narrative   Has living will   Husband is health care POA   Would accept resuscitation attempts but no prolonged artificial ventilation   No tube feeds if cognitively unaware   Review of Systems Doesn't sleep great--falls asleep on  couch, then trouble going back to sleep. Early riser. Appetite is fine Weight fairly stable Bowels are fine No urinary problems. On antifungal for nails--Jublia. Starting to work    Objective:   Physical Exam  Constitutional: She is oriented to person, place, and time. She appears well-developed and well-nourished. No distress.  HENT:  Mouth/Throat: Oropharynx is clear and moist. No oropharyngeal exudate.  Neck: Normal range of motion. Neck supple. No thyromegaly present.  Cardiovascular: Normal rate, regular rhythm, normal heart sounds and intact distal pulses.  Exam reveals no gallop.   No murmur heard. Pulmonary/Chest: Effort normal and breath sounds normal. No respiratory distress. She has no wheezes. She has no rales.  Abdominal: Soft. There is no tenderness.  Genitourinary:  Slight periareolar cystic changes on right--otherwise no findings in breasts  Musculoskeletal: She exhibits no edema and no tenderness.  Lymphadenopathy:    She has  no cervical adenopathy.  Neurological: She is alert and oriented to person, place, and time.  Meta Hatchet, [place]" [id number] Recall 3/3  Psychiatric: She has a normal mood and affect. Her behavior is normal.          Assessment & Plan:

## 2013-12-04 NOTE — Assessment & Plan Note (Signed)
Continues to work with Dr Lucky Cowboy on this

## 2013-12-04 NOTE — Assessment & Plan Note (Signed)
Discussed primary prevention No statins

## 2013-12-04 NOTE — Progress Notes (Signed)
Pre visit review using our clinic review tool, if applicable. No additional management support is needed unless otherwise documented below in the visit note. 

## 2013-12-10 ENCOUNTER — Other Ambulatory Visit (INDEPENDENT_AMBULATORY_CARE_PROVIDER_SITE_OTHER): Payer: Medicare Other

## 2013-12-10 DIAGNOSIS — Z1211 Encounter for screening for malignant neoplasm of colon: Secondary | ICD-10-CM | POA: Diagnosis not present

## 2013-12-10 LAB — FECAL OCCULT BLOOD, IMMUNOCHEMICAL: Fecal Occult Bld: NEGATIVE

## 2013-12-20 ENCOUNTER — Encounter: Payer: Self-pay | Admitting: Internal Medicine

## 2013-12-20 ENCOUNTER — Ambulatory Visit (INDEPENDENT_AMBULATORY_CARE_PROVIDER_SITE_OTHER): Payer: Medicare Other | Admitting: Internal Medicine

## 2013-12-20 VITALS — BP 130/78 | HR 100 | Temp 99.9°F | Resp 14 | Wt 140.5 lb

## 2013-12-20 DIAGNOSIS — J0110 Acute frontal sinusitis, unspecified: Secondary | ICD-10-CM

## 2013-12-20 MED ORDER — AMOXICILLIN 500 MG PO TABS: 1000.0000 mg | ORAL_TABLET | Freq: Two times a day (BID) | ORAL | Status: DC

## 2013-12-20 NOTE — Patient Instructions (Signed)
You can try stopping the glucosamine. If you don't notice any difference in a month, you can stay off it.

## 2013-12-20 NOTE — Assessment & Plan Note (Signed)
May be viral still Discussed supportive care If worsens, start amoxil

## 2013-12-20 NOTE — Progress Notes (Signed)
Pre visit review using our clinic review tool, if applicable. No additional management support is needed unless otherwise documented below in the visit note. 

## 2013-12-20 NOTE — Progress Notes (Signed)
Subjective:    Patient ID: Jessica Sullivan, female    DOB: [birth year], 68 y.o.   MRN: [id number]  HPI Martin Majestic out to eat 5 days ago Then took a nap and awoke with shakes and headache Maxillary sinus tenderness and pain and teeth aching Now with scratchy throat Some cough Not that much drainage Feels very tired No clear cut fever Not SOB  Worried about UTI Just slightly discolored No dysuria or hematuria  Tried nyquil last night--helped her get to sleep early Tylenol and sine-aid also  Current Outpatient Prescriptions on File Prior to Visit  Medication Sig Dispense Refill  . Ascorbic Acid (VITAMIN C PO) Take by mouth daily.      Marland Kitchen aspirin 81 MG tablet Take 81 mg by mouth daily.      Marland Kitchen BIOTIN PO Take by mouth daily.      . Calcium Carbonate-Vitamin D (CALCIUM + D PO) Take by mouth 2 (two) times daily.      . Efinaconazole 10 % SOLN Apply 1 drop topically daily.  8 mL  3  . Glucosamine HCl (GLUCOSAMINE PO) Take by mouth daily.      . Multiple Vitamins-Minerals (CENTRUM SILVER ADULT 50+) TABS Take 1 tablet by mouth daily.      . Omega-3 Fatty Acids (FISH OIL PO) Take by mouth daily.      Marland Kitchen Resveratrol 250 MG CAPS Take 2 capsules by mouth daily.      Marland Kitchen VITAMIN E PO Take by mouth daily.       No current facility-administered medications on file prior to visit.    No Known Allergies  Past Medical History  Diagnosis Date  . Chronic venous insufficiency   . Hyperlipidemia     Past Surgical History  Procedure Laterality Date  . Chin lift  8/14  . Knee arthroscopy Right 3/13    meniscus  . Cesarean section  1978  . Varicose vein surgery  2004, 2011  . Hammer toe surgery  12/13  . Mpj release 2nd toe left      Family History  Problem Relation Age of Onset  . Dementia Mother   . Cancer Father   . Heart disease Neg Hx   . Diabetes Neg Hx   . Cancer Brother     prostate/prostate  . Dementia Brother     corticobasilar degeneration    History   Social History  .  Marital Status: Married    Spouse Name: N/A    Number of Children: 1  . Years of Education: N/A   Occupational History  . 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 Smoking status: Never Smoker   . Smokeless tobacco: Never Used  . Alcohol Use: Yes     Comment: 2 drinks per week  . Drug Use: No  . Sexual Activity: Not on file   Other Topics Concern  . Not on file   Social History Narrative   Has living will   Husband is health care POA   Would accept resuscitation attempts but no prolonged artificial ventilation   No tube feeds if cognitively unaware   Review of Systems No rash No vomiting or diarrhea Appetite is off     Objective:   Physical Exam  Constitutional: She appears well-developed and well-nourished. No distress.  HENT:  Mouth/Throat: Oropharynx is clear and moist. No oropharyngeal exudate.  Frontal >maxillary tenderness Moderate  nasal inflammation TMs normal   Neck: Normal range of motion. Neck supple.  Pulmonary/Chest: Effort normal and breath sounds normal. No respiratory distress. She has no wheezes. She has no rales.  Lymphadenopathy:    She has no cervical adenopathy.          Assessment & Plan:

## 2013-12-28 ENCOUNTER — Telehealth: Payer: Self-pay

## 2013-12-28 NOTE — Telephone Encounter (Signed)
Spoke with patient and advised results, she is doing better and will call if anything changes

## 2013-12-28 NOTE — Telephone Encounter (Addendum)
Pt was seen 12/20/13; pt filled amoxicillin on 12/25/13 and pt noticed on 12/26/13 had problem swallowing and continued taking amoxicillin. Now pt has sorethroat so pt thinks amoxicillin is causing throat problems.Pt plans to stop amoxicillin. Pt feels warm on and off but pt cannot take fever.pt has non productive cough but no wheezing or SOB. Mornings pt feels OK but end of day pt feels fatigued. Pt leaving 12/29/13 for [place] and will be gone 5 days.pt wants to know if can take Tylenol for h/a. [facility]pt request cb. (did not put amoxicillin on pts allergy list until Dr Silvio Pate reviews phone note.)

## 2013-12-28 NOTE — Telephone Encounter (Signed)
It is okay to take tylenol I doubt this is an allergy---likely a viral infection and still in her throat. Can offer appt today if any available

## 2014-02-26 DIAGNOSIS — I8311 Varicose veins of right lower extremity with inflammation: Secondary | ICD-10-CM | POA: Diagnosis not present

## 2014-04-26 DIAGNOSIS — I8311 Varicose veins of right lower extremity with inflammation: Secondary | ICD-10-CM | POA: Diagnosis not present

## 2014-04-26 DIAGNOSIS — I8310 Varicose veins of unspecified lower extremity with inflammation: Secondary | ICD-10-CM | POA: Diagnosis not present

## 2014-06-13 DIAGNOSIS — L821 Other seborrheic keratosis: Secondary | ICD-10-CM | POA: Diagnosis not present

## 2014-06-13 DIAGNOSIS — Z1283 Encounter for screening for malignant neoplasm of skin: Secondary | ICD-10-CM | POA: Diagnosis not present

## 2014-06-13 DIAGNOSIS — L219 Seborrheic dermatitis, unspecified: Secondary | ICD-10-CM | POA: Diagnosis not present

## 2014-06-13 DIAGNOSIS — L578 Other skin changes due to chronic exposure to nonionizing radiation: Secondary | ICD-10-CM | POA: Diagnosis not present

## 2014-06-13 DIAGNOSIS — D485 Neoplasm of uncertain behavior of skin: Secondary | ICD-10-CM | POA: Diagnosis not present

## 2014-06-13 DIAGNOSIS — I8390 Asymptomatic varicose veins of unspecified lower extremity: Secondary | ICD-10-CM | POA: Diagnosis not present

## 2014-06-13 DIAGNOSIS — D1800 Hemangioma unspecified site: Secondary | ICD-10-CM | POA: Diagnosis not present

## 2014-06-13 DIAGNOSIS — L259 Unspecified contact dermatitis, unspecified cause: Secondary | ICD-10-CM | POA: Diagnosis not present

## 2014-06-13 DIAGNOSIS — L853 Xerosis cutis: Secondary | ICD-10-CM | POA: Diagnosis not present

## 2014-06-13 DIAGNOSIS — D229 Melanocytic nevi, unspecified: Secondary | ICD-10-CM | POA: Diagnosis not present

## 2014-06-21 DIAGNOSIS — I83812 Varicose veins of left lower extremities with pain: Secondary | ICD-10-CM | POA: Diagnosis not present

## 2014-06-21 DIAGNOSIS — I8310 Varicose veins of unspecified lower extremity with inflammation: Secondary | ICD-10-CM | POA: Diagnosis not present

## 2014-06-30 NOTE — Op Note (Signed)
PATIENT NAME:  Jessica Sullivan, Jessica Sullivan MR#:  [id number] DATE OF BIRTH:  05-06-1945  DATE OF PROCEDURE:  05/27/2011  PREOPERATIVE DIAGNOSIS: Right knee medial meniscus tear and osteoarthritis.   POSTOPERATIVE DIAGNOSIS: Right knee medial meniscus tear and osteoarthritis with plica.    PROCEDURE: Arthroscopy, right knee with partial medial meniscectomy and plica excision.   SURGEON: Laurene Footman, M.D.   ANESTHESIA: General.   DESCRIPTION OF PROCEDURE: The patient was brought to the Operating Room and after adequate anesthesia was obtained, the right leg was prepped and draped in the usual sterile fashion with a tourniquet and arthroscopic legholder applied. After patient identification and time-out procedures were completed, an inferolateral portal was made and on initial evaluation there are significant degenerative changes present to the patellofemoral joint, but without exposed bone there was very little normal cartilage in the patellofemoral groove. Coming medially, an inferomedial portal was made and a probe introduced. There was a very complex tear, a large tear, of the medial meniscus involving the posterior and middle thirds with fibrillation and a flap that could be brought into the joint that was quite large. Also, the articular cartilage had extensive degenerative change. Pre- and postprocedure pictures obtained. The anterior cruciate ligament was intact. The lateral compartment was entirely normal. The gutters were checked and there were no loose bodies. A full radius shaver was then inserted to resect the torn cartilage. An ArthroCare wand was used to address the rough edges, getting back to a stable smooth margin. Next, the ArthroCare wand was used to ablate the medial plica band which appeared to impinge on the patellofemoral joint. After thorough irrigation of the knee, all instrumentation was withdrawn. The wounds were closed with simple interrupted 4-0 nylon. 20 mL of 0.5% Sensorcaine with  epinephrine was infiltrated into the area of the portals. Xeroform, 4 x 4's, Webril and Ace wrap were applied. The patient was sent to the recovery room in stable condition.   ESTIMATED BLOOD LOSS: Minimal.  COMPLICATIONS: None.    SPECIMEN: None.   ____________________________ Laurene Footman, MD mjm:ap D: 05/27/2011 19:40:27 ET T: 05/28/2011 08:56:11 ET JOB#: [id number]  cc: Laurene Footman, MD, <Dictator> Laurene Footman MD ELECTRONICALLY SIGNED 05/28/2011 16:54

## 2014-07-08 DIAGNOSIS — M25561 Pain in right knee: Secondary | ICD-10-CM | POA: Diagnosis not present

## 2014-07-08 DIAGNOSIS — M1711 Unilateral primary osteoarthritis, right knee: Secondary | ICD-10-CM | POA: Diagnosis not present

## 2014-08-13 DIAGNOSIS — I83812 Varicose veins of left lower extremities with pain: Secondary | ICD-10-CM | POA: Diagnosis not present

## 2014-08-13 DIAGNOSIS — I8310 Varicose veins of unspecified lower extremity with inflammation: Secondary | ICD-10-CM | POA: Diagnosis not present

## 2014-08-23 ENCOUNTER — Other Ambulatory Visit: Payer: Self-pay | Admitting: Internal Medicine

## 2014-08-23 DIAGNOSIS — Z1231 Encounter for screening mammogram for malignant neoplasm of breast: Secondary | ICD-10-CM

## 2014-09-10 DIAGNOSIS — I83812 Varicose veins of left lower extremities with pain: Secondary | ICD-10-CM | POA: Diagnosis not present

## 2014-09-10 DIAGNOSIS — I8310 Varicose veins of unspecified lower extremity with inflammation: Secondary | ICD-10-CM | POA: Diagnosis not present

## 2014-09-10 DIAGNOSIS — I8311 Varicose veins of right lower extremity with inflammation: Secondary | ICD-10-CM | POA: Diagnosis not present

## 2014-10-02 ENCOUNTER — Ambulatory Visit
Admission: RE | Admit: 2014-10-02 | Discharge: 2014-10-02 | Disposition: A | Discharge status: Home / Self Care | Payer: Medicare Other | Source: Ambulatory Visit | Attending: Internal Medicine | Admitting: Internal Medicine

## 2014-10-02 ENCOUNTER — Other Ambulatory Visit: Payer: Self-pay | Admitting: Internal Medicine

## 2014-10-02 DIAGNOSIS — Z1231 Encounter for screening mammogram for malignant neoplasm of breast: Secondary | ICD-10-CM | POA: Diagnosis not present

## 2014-12-08 DIAGNOSIS — Z23 Encounter for immunization: Secondary | ICD-10-CM | POA: Diagnosis not present

## 2014-12-17 ENCOUNTER — Ambulatory Visit (INDEPENDENT_AMBULATORY_CARE_PROVIDER_SITE_OTHER): Payer: Medicare Other | Admitting: Internal Medicine

## 2014-12-17 ENCOUNTER — Encounter: Payer: Self-pay | Admitting: Internal Medicine

## 2014-12-17 VITALS — BP 138/80 | HR 68 | Temp 97.7°F | Ht 63.0 in | Wt 142.0 lb

## 2014-12-17 DIAGNOSIS — Z1211 Encounter for screening for malignant neoplasm of colon: Secondary | ICD-10-CM

## 2014-12-17 DIAGNOSIS — Z0000 Encounter for general adult medical examination without abnormal findings: Secondary | ICD-10-CM

## 2014-12-17 DIAGNOSIS — Z7189 Other specified counseling: Secondary | ICD-10-CM | POA: Diagnosis not present

## 2014-12-17 DIAGNOSIS — I83812 Varicose veins of left lower extremities with pain: Secondary | ICD-10-CM

## 2014-12-17 DIAGNOSIS — E785 Hyperlipidemia, unspecified: Secondary | ICD-10-CM

## 2014-12-17 LAB — CBC WITH DIFFERENTIAL/PLATELET
BASOS ABS: 0.1 10*3/uL (ref 0.0–0.1)
Basophils Relative: 0.7 % (ref 0.0–3.0)
Eosinophils Absolute: 0.4 10*3/uL (ref 0.0–0.7)
Eosinophils Relative: 4.7 % (ref 0.0–5.0)
HEMATOCRIT: 45 % (ref 36.0–46.0)
HEMOGLOBIN: 14.7 g/dL (ref 12.0–15.0)
LYMPHS ABS: 2.5 10*3/uL (ref 0.7–4.0)
LYMPHS PCT: 26 % (ref 12.0–46.0)
MCHC: 32.7 g/dL (ref 30.0–36.0)
MCV: 89.9 fl (ref 78.0–100.0)
MONOS PCT: 5 % (ref 3.0–12.0)
Monocytes Absolute: 0.5 10*3/uL (ref 0.1–1.0)
NEUTROS PCT: 63.6 % (ref 43.0–77.0)
Neutro Abs: 6 10*3/uL (ref 1.4–7.7)
Platelets: 267 10*3/uL (ref 150.0–400.0)
RBC: 5.01 Mil/uL (ref 3.87–5.11)
RDW: 13.9 % (ref 11.5–15.5)
WBC: 9.4 10*3/uL (ref 4.0–10.5)

## 2014-12-17 NOTE — Assessment & Plan Note (Signed)
I have personally reviewed the Medicare Annual Wellness questionnaire and have noted 1. The patient's medical and social history 2. Their use of alcohol, tobacco or illicit drugs 3. Their current medications and supplements 4. The patient's functional ability including ADL's, fall risks, home safety risks and hearing or visual             impairment. 5. Diet and physical activities 6. Evidence for depression or mood disorders  The patients weight, height, BMI and visual acuity have been recorded in the chart I have made referrals, counseling and provided education to the patient based review of the above and I have provided the pt with a written personalized care plan for preventive services.  I have provided you with a copy of your personalized plan for preventive services. Please take the time to review along with your updated medication list.  UTD with mammo Fecal immunoassay Good with lifestyle

## 2014-12-17 NOTE — Progress Notes (Signed)
Pre visit review using our clinic review tool, if applicable. No additional management support is needed unless otherwise documented below in the visit note. 

## 2014-12-17 NOTE — Progress Notes (Signed)
Subjective:    Patient ID: Jessica Sullivan, female    DOB: 1945/08/21, 69 y.o.   MRN: [id number]  HPI Here for Medicare wellness visit and follow up of chronic medical problems Reviewed form and advanced directives Reviewed other doctors Rare drink of alcohol No tobacco Walks a lot daily Vision and hearing are okay 1 fall this year--sneaker caught on pavement. No sig injury No depression or anhedonia No sig cognitive issues Independent with instrumental ADLs  Feels well No new concerns  Does have arthritis in knees Saw Dr Menz---noticed worsening of arthritis No meds for this-- will ice it if pain is worse  Still on fish oil Not interested in statin  Current Outpatient Prescriptions on File Prior to Visit  Medication Sig Dispense Refill  . aspirin 81 MG tablet Take 81 mg by mouth daily.    . Calcium Carbonate-Vitamin D (CALCIUM + D PO) Take by mouth 2 (two) times daily.    . Glucosamine HCl (GLUCOSAMINE PO) Take by mouth daily.    . Multiple Vitamins-Minerals (CENTRUM SILVER ADULT 50+) TABS Take 1 tablet by mouth daily.    . Omega-3 Fatty Acids (FISH OIL PO) Take by mouth daily.    Marland Kitchen Resveratrol 250 MG CAPS Take 2 capsules by mouth daily.     No current facility-administered medications on file prior to visit.    No Known Allergies  Past Medical History  Diagnosis Date  . Chronic venous insufficiency   . Hyperlipidemia     Past Surgical History  Procedure Laterality Date  . Chin lift  8/14  . Knee arthroscopy Right 3/13    meniscus  . Cesarean section  1978  . Varicose vein surgery  2004, 2011  . Hammer toe surgery  12/13  . Mpj release 2nd toe left      Family History  Problem Relation Age of Onset  . Dementia Mother   . Cancer Father   . Heart disease Neg Hx   . Diabetes Neg Hx   . Cancer Brother     prostate/prostate  . Dementia Brother     corticobasilar degeneration    Social History   Social History  . Marital Status: Married    Spouse  Name: N/A  . Number of Children: 1  . Years of Education: N/A   Occupational History  . 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 Smoking status: Never Smoker   . Smokeless tobacco: Never Used  . Alcohol Use: Yes     Comment: 2 drinks per week  . Drug Use: No  . Sexual Activity: Not on file   Other Topics Concern  . Not on file   Social History Narrative   Has living will   Husband is health care POA   Would accept resuscitation attempts but no prolonged artificial ventilation   No tube feeds if cognitively unaware   Review of Systems Appetite is good Weight stable Sleeps fairly well-- falls asleep on couch and occ trouble getting back to sleep Teeth okay--- keeps up with dentist Had vein treatments and is done with this. Worst veins are better Bowels are fine No back pain No urinary problems    Objective:   Physical Exam  Constitutional: She is oriented to person, place, and time. She appears well-developed and well-nourished. No distress.  HENT:  Mouth/Throat: Oropharynx is clear and moist. No oropharyngeal exudate.  Neck: Normal  range of motion. Neck supple. No thyromegaly present.  Cardiovascular: Normal rate, regular rhythm, normal heart sounds and intact distal pulses.  Exam reveals no gallop.   No murmur heard. Pulmonary/Chest: Effort normal and breath sounds normal. No respiratory distress. She has no wheezes. She has no rales.  Abdominal: Soft. There is no tenderness.  Musculoskeletal: She exhibits no edema or tenderness.  Lymphadenopathy:    She has no cervical adenopathy.  Neurological: She is alert and oriented to person, place, and time.  President-- "Obama, Bush, Clinton" [phone] [id number] Recall 2/3  Skin: No rash noted. No erythema.  Psychiatric: She has a normal mood and affect. Her behavior is normal.          Assessment & Plan:

## 2014-12-17 NOTE — Assessment & Plan Note (Signed)
Done with Rx 

## 2014-12-17 NOTE — Assessment & Plan Note (Signed)
See social history 

## 2014-12-17 NOTE — Assessment & Plan Note (Signed)
Discussed primary prevention--no statin

## 2014-12-18 LAB — COMPREHENSIVE METABOLIC PANEL
ALT: 19 U/L (ref 0–35)
AST: 18 U/L (ref 0–37)
Albumin: 4.3 g/dL (ref 3.5–5.2)
Alkaline Phosphatase: 102 U/L (ref 39–117)
BILIRUBIN TOTAL: 0.3 mg/dL (ref 0.2–1.2)
BUN: 20 mg/dL (ref 6–23)
CALCIUM: 9.5 mg/dL (ref 8.4–10.5)
CO2: 30 mEq/L (ref 19–32)
Chloride: 103 mEq/L (ref 96–112)
Creatinine, Ser: 0.69 mg/dL (ref 0.40–1.20)
GFR: 89.7 mL/min (ref >60.00)
GLUCOSE: 98 mg/dL (ref 70–99)
POTASSIUM: 3.8 meq/L (ref 3.5–5.1)
Sodium: 141 mEq/L (ref 135–145)
TOTAL PROTEIN: 7.4 g/dL (ref 6.0–8.3)

## 2014-12-18 LAB — LIPID PANEL
Cholesterol: 215 mg/dL — ABNORMAL HIGH (ref 0–200)
HDL: 72.6 mg/dL (ref >39.00)
LDL Cholesterol: 127 mg/dL — ABNORMAL HIGH (ref 0–99)
NONHDL: 142.03
Total CHOL/HDL Ratio: 3
Triglycerides: 75 mg/dL (ref 0.0–149.0)
VLDL: 15 mg/dL (ref 0.0–40.0)

## 2014-12-23 ENCOUNTER — Other Ambulatory Visit (INDEPENDENT_AMBULATORY_CARE_PROVIDER_SITE_OTHER): Payer: Medicare Other

## 2014-12-23 DIAGNOSIS — Z1211 Encounter for screening for malignant neoplasm of colon: Secondary | ICD-10-CM

## 2014-12-23 LAB — FECAL OCCULT BLOOD, IMMUNOCHEMICAL: Fecal Occult Bld: NEGATIVE

## 2015-01-08 ENCOUNTER — Ambulatory Visit (INDEPENDENT_AMBULATORY_CARE_PROVIDER_SITE_OTHER): Payer: Medicare Other | Admitting: Obstetrics and Gynecology

## 2015-01-08 ENCOUNTER — Encounter: Payer: Self-pay | Admitting: Obstetrics and Gynecology

## 2015-01-08 VITALS — BP 133/80 | HR 85 | Ht 62.0 in | Wt 142.4 lb

## 2015-01-08 DIAGNOSIS — Z780 Asymptomatic menopausal state: Secondary | ICD-10-CM | POA: Diagnosis not present

## 2015-01-08 DIAGNOSIS — Z124 Encounter for screening for malignant neoplasm of cervix: Secondary | ICD-10-CM

## 2015-01-08 DIAGNOSIS — L900 Lichen sclerosus et atrophicus: Secondary | ICD-10-CM | POA: Insufficient documentation

## 2015-01-08 DIAGNOSIS — N763 Subacute and chronic vulvitis: Secondary | ICD-10-CM

## 2015-01-08 HISTORY — DX: Lichen sclerosus et atrophicus: L90.0

## 2015-01-08 NOTE — Patient Instructions (Signed)
1.  Pap smear completed. 2.  Annual mammogram is recommended. 3.  Return in 2 weeks for a vulvar biopsy. 4.  Physical exam is suggestive of lichen sclerosis.

## 2015-01-08 NOTE — Addendum Note (Signed)
Addended by: Elouise Munroe on: 01/08/2015 04:06 PM   Modules accepted: Orders

## 2015-01-08 NOTE — Progress Notes (Signed)
Patient ID: Jessica Sullivan, female   DOB: 1945-07-31, 69 y.o.   MRN: [id number] ANNUAL PREVENTATIVE CARE GYN  ENCOUNTER NOTE  Subjective:       Jessica Sullivan is a 69 y.o. G86P1001 female here for a routine annual gynecologic exam.  Current complaints: 1.  New patient establishing care 2.  Routine wellness exam, last pap 2.5 years ago 3.  Occasional vulvar itching - uses hydrocortisone as needed  69 year old white married female, para 40, menopausal at age 58, on no HRT therapy, with no history of significant vasomotor symptoms or vaginal atrophy symptoms, presents in referral from Dr. Viviana Simpler for establishment of gynecologic care.   Gynecologic History Menarche age 23 Menopause age 15 No LMP recorded. Patient is postmenopausal. Contraception: post menopausal status, never been on HRT Last Pap: 2014. Results were: normal. Unsure of HPV testing. No history of abnormal tests. Last mammogram: 2016. Results were: normal Married, monogamous, no history of STDs  Obstetric History OB History  Gravida Para Term Preterm AB SAB TAB Ectopic Multiple Living  1 1 1       1     # Outcome Date GA Lbr Len/2nd Weight Sex Delivery Anes PTL Lv  1 Term 1978   8 lb 9.6 oz (3.901 kg) M CS-LVertical   Y      Past Medical History  Diagnosis Date  . Chronic venous insufficiency   . Hyperlipidemia     Past Surgical History  Procedure Laterality Date  . Chin lift  8/14  . Knee arthroscopy Right 3/13    meniscus  . Cesarean section  1978  . Varicose vein surgery  2004, 2011  . Hammer toe surgery  12/13  . Mpj release 2nd toe left    . Dilation and curettage of uterus  1990    year ago- polyp    Current Outpatient Prescriptions on File Prior to Visit  Medication Sig Dispense Refill  . aspirin 81 MG tablet Take 81 mg by mouth daily.    . Biotin 10 MG CAPS Take by mouth.    . Calcium Carbonate-Vitamin D (CALCIUM + D PO) Take by mouth 2 (two) times daily.    . Glucosamine HCl (GLUCOSAMINE  PO) Take by mouth daily.    . Multiple Vitamins-Minerals (CENTRUM SILVER ADULT 50+) TABS Take 1 tablet by mouth daily.    . Omega-3 Fatty Acids (FISH OIL PO) Take by mouth daily.    Marland Kitchen Resveratrol 250 MG CAPS Take 2 capsules by mouth daily.     No current facility-administered medications on file prior to visit.    No Known Allergies  Social History   Social History  . Marital Status: Married    Spouse Name: N/A  . Number of Children: 1  . Years of Education: N/A   Occupational History  . 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 Smoking status: Never Smoker   . Smokeless tobacco: Never Used  . Alcohol Use: Yes     Comment: once a week  . Drug Use: No  . Sexual Activity: Yes    Birth Control/ Protection: Post-menopausal   Other Topics Concern  . Not on file   Social History Narrative   Has living will   Husband is health care POA   Would accept resuscitation attempts but no prolonged artificial ventilation   No tube feeds if cognitively unaware    Family History  Problem Relation Age of Onset  . Dementia Mother   . Cancer Father   . Heart disease Neg Hx   . Diabetes Neg Hx   . Breast cancer Neg Hx   . Ovarian cancer Neg Hx   . Colon cancer Neg Hx   . Cancer Brother     prostate/prostate  . Dementia Brother     corticobasilar degeneration    The following portions of the patient's history were reviewed and updated as appropriate: allergies, current medications, past family history, past medical history, past social history, past surgical history and problem list.  Review of Systems ROS Review of Systems - General ROS: negative for - chills, fatigue, fever, hot flashes, night sweats Psychological ROS: negative for - anxiety, decreased libido, depression, mood swings ENT ROS: negative for - headaches, hearing change, visual changes Hematological and Lymphatic ROS: negative for - bleeding problems,  bruising, swollen lymph nodes or weight loss Endocrine ROS: negative for - galactorrhea, hair pattern changes, hot flashes, malaise/lethargy, mood swings, palpitations, polydipsia/polyuria, skin changes, temperature intolerance or unexpected weight changes Breast ROS: negative for - new or changing breast lumps or nipple discharge Respiratory ROS: negative for - cough or shortness of breath Cardiovascular ROS: negative for - chest pain, irregular heartbeat, palpitations or shortness of breath Gastrointestinal ROS: no abdominal pain, change in bowel habits, or black or bloody stools Genito-Urinary ROS: no dysuria, trouble voiding, or hematuria Musculoskeletal ROS: positive for joint pain R knee Neurological ROS: negative for - bowel and bladder control changes Dermatological ROS: negative for rash and skin lesion changes   Objective:   BP 133/80 mmHg  Pulse 85  Ht 5\' 2"  (1.575 m)  Wt 142 lb 6.4 oz (64.592 kg)  BMI 26.04 kg/m2 CONSTITUTIONAL: Well-developed, well-nourished female in no acute distress.  PSYCHIATRIC: Normal mood and affect. Normal behavior. Normal judgment and thought content. [place]: Alert and oriented to person, place, and time. Normal muscle tone coordination. No cranial nerve deficit noted. HENT:  Normocephalic, atraumatic EYES: Conjunctivae and EOM are normal. NECK: Normal range of motion, supple, no masses.  Normal thyroid.  SKIN: Skin is warm and dry. No rash noted. Not diaphoretic. No erythema. No pallor. CARDIOVASCULAR: Normal heart rate noted, regular rhythm, no murmur. RESPIRATORY: Clear to auscultation bilaterally.  BREASTS: Symmetric in size. No masses, skin changes, nipple drainage, or lymphadenopathy. ABDOMEN: Soft, normal bowel sounds, no distention noted.  No tenderness, rebound or guarding.  BLADDER: Normal PELVIC:  External Genitalia: Symmetric erythema with lichenified plaques of the vulva and perineum; agglutination of the labia minora to the labia  majora; perianal erythematous change, symmetric   BUS: Normal  Vagina: Normal  Cervix: Normal  Uterus: Normal  Adnexa: Normal  RV: External Exam NormaI, No Rectal Masses and Normal Sphincter tone  MUSCULOSKELETAL: Normal range of motion. No tenderness.  No cyanosis, clubbing, or edema.  2+ distal pulses. LYMPHATIC: No Axillary, Supraclavicular, or Inguinal Adenopathy.    Assessment:   Annual gynecologic examination 69 y.o. Contraception: post menopausal status without vasomotor symptoms, not on HRT Normal BMI  History of normal mammograms. History of normal Pap smears. History of DEXA scan showing normal bone density Lichenified lesions of vulva - likely lichen sclerosus, r/o dysplasia   Plan:  Pap: Pap Co Test Mammogram: Not Indicated, last one 2016 Stool Guaiac Testing:  Not Indicated, managed by PCP Labs: thru pcp Routine preventative health maintenance measures emphasized: Exercise/Diet/Weight control, Tobacco Warnings and Alcohol/Substance use risks  Schedule biopsy of vulvar lesions in 2-4  weeks to confirm lichen sclerosus and begin steroid treatment  Return to [place], [place]  Hubert Azure, PA-S Brayton Mars, MD   I have seen, interviewed, and examined the patient in conjunction with the [facility].A. student and affirm the diagnosis and management plan. Carlethia Mesquita A. Charice Zuno, MD, FACOG   Note: This dictation was prepared with Dragon dictation along with smaller phrase technology. Any transcriptional errors that result from this process are unintentional.

## 2015-01-11 LAB — PAP IG AND HPV HIGH-RISK
HPV, HIGH-RISK: NEGATIVE
PAP SMEAR COMMENT: 0

## 2015-01-21 ENCOUNTER — Ambulatory Visit (INDEPENDENT_AMBULATORY_CARE_PROVIDER_SITE_OTHER): Payer: Medicare Other | Admitting: Obstetrics and Gynecology

## 2015-01-21 ENCOUNTER — Encounter: Payer: Self-pay | Admitting: Obstetrics and Gynecology

## 2015-01-21 VITALS — BP 137/78 | HR 81 | Ht 62.0 in | Wt 142.5 lb

## 2015-01-21 DIAGNOSIS — N763 Subacute and chronic vulvitis: Secondary | ICD-10-CM

## 2015-01-21 DIAGNOSIS — L900 Lichen sclerosus et atrophicus: Secondary | ICD-10-CM | POA: Diagnosis not present

## 2015-01-21 NOTE — Progress Notes (Signed)
Patient ID: Jessica Sullivan, female   DOB: 1946/01/01, 69 y.o.   MRN: [id number]   Chief complaint: 1.  Chronic vulvitis. 2.  Vulvar biopsy  OBJECTIVE: BP 137/78 mmHg  Pulse 81  Ht 5\' 2"  (1.575 m)  Wt 142 lb 8 oz (64.638 kg)  BMI 26.06 kg/m2 PELVIC: External Genitalia: Symmetric erythema with lichenified plaques of the vulva and perineum; agglutination of the labia minora to the labia majora; perianal erythematous change, symmetric BUS: Normal  PROCEDURE:  Vulvar biopsy. 3.5 mm punch biopsy is performed following Betadine cleansing and 1% lidocaine local anesthetic using 3 cc's.  2 3-0 Vicryl sutures were used for hemostasis.  Blood loss-minimal.  Procedure tolerated well.  ASSESSMENT: 1.  Chronic vulvitis, suspect lichen sclerosus.  PLAN: 1.  Vulvar biopsy 0.5 mm punch. 2.  Return in 1 week for follow-up. 3.  Wound care instructions given  Brayton Mars, MD

## 2015-01-23 ENCOUNTER — Ambulatory Visit: Payer: Medicare Other | Admitting: Obstetrics and Gynecology

## 2015-01-23 LAB — PATHOLOGY

## 2015-01-26 NOTE — Patient Instructions (Signed)
1.  Wound care instructions given.  May do sitz baths as needed. 2.  Tylenol or Advil as needed for pain. 3.  Return in 1 week for follow-up and further management planning.

## 2015-01-28 ENCOUNTER — Ambulatory Visit (INDEPENDENT_AMBULATORY_CARE_PROVIDER_SITE_OTHER): Payer: Medicare Other | Admitting: Obstetrics and Gynecology

## 2015-01-28 ENCOUNTER — Encounter: Payer: Self-pay | Admitting: Obstetrics and Gynecology

## 2015-01-28 VITALS — BP 137/74 | HR 82 | Ht 62.0 in | Wt 144.3 lb

## 2015-01-28 DIAGNOSIS — N763 Subacute and chronic vulvitis: Secondary | ICD-10-CM | POA: Diagnosis not present

## 2015-01-28 DIAGNOSIS — L900 Lichen sclerosus et atrophicus: Secondary | ICD-10-CM | POA: Diagnosis not present

## 2015-01-28 MED ORDER — CLOBETASOL PROPIONATE 0.05 % EX OINT: 1.0000 "application " | TOPICAL_OINTMENT | Freq: Two times a day (BID) | CUTANEOUS | Status: DC

## 2015-01-28 NOTE — Progress Notes (Signed)
Patient ID: Jessica Sullivan, female   DOB: 1946-02-12, 69 y.o.   MRN: [id number] 1 week f/u on vulvar bx Path - LS  no issues with bx site  Chief complaint: 1.  Follow-up on vulvar biopsy.  Patient is 1 week status post vulvar biopsy for chronic vulvitis. Pathology: Lichen sclerosis.  Patient reports no pain or bleeding at biopsy site.  OBJECTIVE: BP 137/74 mmHg  Pulse 82  Ht 5\' 2"  (1.575 m)  Wt 144 lb 4.8 oz (65.454 kg)  BMI 26.39 kg/m2 Pelvic exam: External genitalia-left labia majora biopsy site is approximated with suture and healing without evidence of erythema, induration, or drainage.  ASSESSMENT: Lichen sclerosis of vulva.  PLAN: 1.  Temovate ointment 0.05% topically twice a day for 2 weeks followed by daily for 4 weeks. 2.  Return in 6 weeks for recheck.  A total of 15 minutes were spent face-to-face with the patient during this encounter and over half of that time dealt with counseling and coordination of care.  Brayton Mars, MD  Note: This dictation was prepared with Dragon dictation along with smaller phrase technology. Any transcriptional errors that result from this process are unintentional.

## 2015-02-03 NOTE — Patient Instructions (Addendum)
1.  Apply Temovate ointment 0.05% to the vulva twice a day for 2 weeks, then daily 6 weeks 2.  Return in 8 weeks for follow-up

## 2015-03-06 ENCOUNTER — Encounter: Payer: Self-pay | Admitting: *Deleted

## 2015-03-11 ENCOUNTER — Telehealth: Payer: Self-pay | Admitting: Obstetrics and Gynecology

## 2015-03-11 NOTE — Telephone Encounter (Signed)
[facility] [address]--- she wants to know if there is another brand of the cream (clobetasol propionate ointment). She had to pay $259.00. She wpuld like something else cheaper

## 2015-03-11 NOTE — Telephone Encounter (Signed)
Pt advised no other meds rxed for lichens- May want to shop around- [facility] or [facility]. Pt states she has a pharmacist in [place] that may be able to get in cheaper- will contact me if I need to send in rx to another pharmacy/

## 2015-04-01 ENCOUNTER — Encounter: Payer: Self-pay | Admitting: Obstetrics and Gynecology

## 2015-04-01 ENCOUNTER — Ambulatory Visit (INDEPENDENT_AMBULATORY_CARE_PROVIDER_SITE_OTHER): Payer: Medicare Other | Admitting: Obstetrics and Gynecology

## 2015-04-01 VITALS — BP 118/80 | HR 87 | Ht 63.0 in | Wt 144.6 lb

## 2015-04-01 DIAGNOSIS — N763 Subacute and chronic vulvitis: Secondary | ICD-10-CM

## 2015-04-01 DIAGNOSIS — L900 Lichen sclerosus et atrophicus: Secondary | ICD-10-CM | POA: Diagnosis not present

## 2015-04-01 NOTE — Progress Notes (Signed)
Chief complaint: 1.  Lichen sclerosus.  Patient presents for 2 month follow-up on lichen sclerosus of the vulva.  She is asymptomatic now.  At this time.  She has used the Temovate ointment twice a day for 2 weeks followed by 6 weeks of daily application.  She is astigmatic.  OBJECTIVE: BP 118/80 mmHg  Pulse 87  Ht 5\' 3"  (1.6 m)  Wt 144 lb 9.6 oz (65.59 kg)  BMI 25.62 kg/m2  PELVIC: External Genitalia:Previously identified Symmetric erythema with lichenified plaques of the vulva and perineum Are resolved; agglutination of the labia minora to the labia majora; Previously identified Symmetric perianal erythematous change Is resolved BUS: Normal  ASSESSMENT: 1.  Lichen sclerosis of vulva, with good response to Temovate ointment.  PLAN: 1.  Continue Temovate ointment every other day for 4 weeks, followed by twice weekly application for 4 weeks. 2.  Return in 2 months for follow-up  A total of 15 minutes were spent face-to-face with the patient during this encounter and over half of that time dealt with counseling and coordination of care.  Brayton Mars, MD  Note: This dictation was prepared with Dragon dictation along with smaller phrase technology. Any transcriptional errors that result from this process are unintentional.

## 2015-04-01 NOTE — Patient Instructions (Signed)
1.  Continue with Temovate ointment 0.05% every other day topically for 1 month; then continue ointment twice a week for 1 month. 2.  Return in 2 months for follow-up

## 2015-04-24 DIAGNOSIS — H25013 Cortical age-related cataract, bilateral: Secondary | ICD-10-CM | POA: Diagnosis not present

## 2015-05-21 ENCOUNTER — Telehealth: Payer: Self-pay

## 2015-05-21 NOTE — Telephone Encounter (Signed)
Please let her know that this can wait for her next regular check up

## 2015-05-21 NOTE — Telephone Encounter (Signed)
Pt left v/m; has received notices that pt needs to have Hep C testing; pt wants order put in so can come to [facility] for lab drawn. Please advise.last annual 12/17/2014.

## 2015-05-22 NOTE — Telephone Encounter (Signed)
Left detailed message on voicemail (per DPR) 

## 2015-05-27 ENCOUNTER — Ambulatory Visit (INDEPENDENT_AMBULATORY_CARE_PROVIDER_SITE_OTHER): Payer: Medicare Other | Admitting: Obstetrics and Gynecology

## 2015-05-27 ENCOUNTER — Encounter: Payer: Self-pay | Admitting: Obstetrics and Gynecology

## 2015-05-27 VITALS — BP 149/70 | HR 80 | Ht 63.0 in | Wt 143.9 lb

## 2015-05-27 DIAGNOSIS — N763 Subacute and chronic vulvitis: Secondary | ICD-10-CM | POA: Diagnosis not present

## 2015-05-27 DIAGNOSIS — L900 Lichen sclerosus et atrophicus: Secondary | ICD-10-CM | POA: Diagnosis not present

## 2015-05-27 DIAGNOSIS — Z780 Asymptomatic menopausal state: Secondary | ICD-10-CM | POA: Diagnosis not present

## 2015-05-27 NOTE — Patient Instructions (Signed)
1. Continued Temovate ointment twice a week 2. Return in 6 months for follow-up

## 2015-05-27 NOTE — Progress Notes (Signed)
GYN ENCOUNTER NOTE  Subjective:       Jessica Sullivan is a 70 y.o. G48P1001 female is here for gynecologic evaluation of the following issues:  1. Lichen sclerosus.  Patient presents for 2 month follow-up of vulvar lichen sclerosus. She is asymptomatic since last appt. She has used the Temovate ointment every other day for 4 weeks, followed by twice weekly application for 4 weeks.   Gynecologic History No LMP recorded. Patient is postmenopausal. Contraception: post menopausal status Last Pap: 11/16. Results were: normal Last mammogram: 7/16. Results were: normal  Obstetric History OB History  Gravida Para Term Preterm AB SAB TAB Ectopic Multiple Living  1 1 1       1     # Outcome Date GA Lbr Len/2nd Weight Sex Delivery Anes PTL Lv  1 Term 1978   8 lb 9.6 oz (3.901 kg) M CS-LVertical   Y      Past Medical History  Diagnosis Date  . Chronic venous insufficiency   . Hyperlipidemia     Past Surgical History  Procedure Laterality Date  . Chin lift  8/14  . Knee arthroscopy Right 3/13    meniscus  . Cesarean section  1978  . Varicose vein surgery  2004, 2011  . Hammer toe surgery  12/13  . Mpj release 2nd toe left    . Dilation and curettage of uterus  1990    year ago- polyp    Current Outpatient Prescriptions on File Prior to Visit  Medication Sig Dispense Refill  . aspirin 81 MG tablet Take 81 mg by mouth daily.    . Biotin 10 MG CAPS Take by mouth.    . Calcium Carbonate-Vitamin D (CALCIUM + D PO) Take by mouth 2 (two) times daily.    . clobetasol ointment (TEMOVATE) AB-123456789 % Apply 1 application topically 2 (two) times daily. Use 2 times daily for 2 weeks. Then daily for 6 weeks. 30 g 6  . Glucosamine HCl (GLUCOSAMINE PO) Take by mouth daily.    . Multiple Vitamins-Minerals (CENTRUM SILVER ADULT 50+) TABS Take 1 tablet by mouth daily.    . Omega-3 Fatty Acids (FISH OIL PO) Take by mouth daily.    Marland Kitchen Resveratrol 250 MG CAPS Take 2 capsules by mouth daily.     No  current facility-administered medications on file prior to visit.    No Known Allergies  Social History   Social History  . Marital Status: Married    Spouse Name: N/A  . Number of Children: 1  . Years of Education: N/A   Occupational History  . 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 Smoking status: Never Smoker   . Smokeless tobacco: Never Used  . Alcohol Use: Yes     Comment: once a week  . Drug Use: No  . Sexual Activity: Yes    Birth Control/ Protection: Post-menopausal   Other Topics Concern  . Not on file   Social History Narrative   Has living will   Husband is health care POA   Would accept resuscitation attempts but no prolonged artificial ventilation   No tube feeds if cognitively unaware    Family History  Problem Relation Age of Onset  . Dementia Mother   . Cancer Father   . Heart disease Neg Hx   . Diabetes Neg Hx   . Breast cancer Neg Hx   . Ovarian  cancer Neg Hx   . Colon cancer Neg Hx   . Cancer Brother     prostate/prostate  . Dementia Brother     corticobasilar degeneration    The following portions of the patient's history were reviewed and updated as appropriate: allergies, current medications, past family history, past medical history, past social history, past surgical history and problem list.  Review of Systems Review of Systems - General ROS: negative for - chills, fatigue, fever, hot flashes, malaise or night sweats Hematological and Lymphatic ROS: negative for - bleeding problems or swollen lymph nodes Gastrointestinal ROS: negative for - abdominal pain, blood in stools, change in bowel habits and nausea/vomiting Musculoskeletal ROS: negative for - joint pain, muscle pain or muscular weakness Genito-Urinary ROS: negative for - change in menstrual cycle, dysmenorrhea, dyspareunia, dysuria, genital discharge, genital ulcers, hematuria, incontinence, irregular/heavy menses,  nocturia or pelvic pain  Objective:   BP 149/70 mmHg  Pulse 80  Ht 5\' 3"  (1.6 m)  Wt 143 lb 14.4 oz (65.273 kg)  BMI 25.50 kg/m2 CONSTITUTIONAL: Well-developed, well-nourished female in no acute distress.   PELVIC:  External Genitalia: Previously identified mild vulvar hyperemia;  Lichenified plaques of vulva and perineum resolved.   Previously identified gglutination of labia minora to labia majora present  BUS: Normal  Vagina: normal   Assessment:   1. Lichen sclerosis of vulva, with good response to Temovate ointment    Plan:   1. Continue Temovate ointment application twice per week 2. Return in 2 months for follow-up  A total of 15 minutes were spent face-to-face with the patient during this encounter and over half of that time dealt with counseling and coordination of care.    Olene Floss, PA-S Brayton Mars, MD   I have seen, interviewed, and examined the patient in conjunction with the [facility].A. student and affirm the diagnosis and management plan. Eulonda Andalon A. Zhoe Catania, MD, FACOG   Note: This dictation was prepared with Dragon dictation along with smaller phrase technology. Any transcriptional errors that result from this process are unintentional.

## 2015-06-16 DIAGNOSIS — M1711 Unilateral primary osteoarthritis, right knee: Secondary | ICD-10-CM | POA: Diagnosis not present

## 2015-06-16 DIAGNOSIS — M170 Bilateral primary osteoarthritis of knee: Secondary | ICD-10-CM | POA: Diagnosis not present

## 2015-06-16 DIAGNOSIS — M25561 Pain in right knee: Secondary | ICD-10-CM | POA: Diagnosis not present

## 2015-06-16 DIAGNOSIS — M25562 Pain in left knee: Secondary | ICD-10-CM | POA: Diagnosis not present

## 2015-06-19 DIAGNOSIS — D485 Neoplasm of uncertain behavior of skin: Secondary | ICD-10-CM | POA: Diagnosis not present

## 2015-06-19 DIAGNOSIS — L659 Nonscarring hair loss, unspecified: Secondary | ICD-10-CM | POA: Diagnosis not present

## 2015-06-19 DIAGNOSIS — D229 Melanocytic nevi, unspecified: Secondary | ICD-10-CM | POA: Diagnosis not present

## 2015-06-19 DIAGNOSIS — I781 Nevus, non-neoplastic: Secondary | ICD-10-CM | POA: Diagnosis not present

## 2015-06-19 DIAGNOSIS — L812 Freckles: Secondary | ICD-10-CM | POA: Diagnosis not present

## 2015-06-19 DIAGNOSIS — I8393 Asymptomatic varicose veins of bilateral lower extremities: Secondary | ICD-10-CM | POA: Diagnosis not present

## 2015-06-19 DIAGNOSIS — L578 Other skin changes due to chronic exposure to nonionizing radiation: Secondary | ICD-10-CM | POA: Diagnosis not present

## 2015-06-19 DIAGNOSIS — D1800 Hemangioma unspecified site: Secondary | ICD-10-CM | POA: Diagnosis not present

## 2015-06-19 DIAGNOSIS — I788 Other diseases of capillaries: Secondary | ICD-10-CM | POA: Diagnosis not present

## 2015-06-19 DIAGNOSIS — L821 Other seborrheic keratosis: Secondary | ICD-10-CM | POA: Diagnosis not present

## 2015-06-19 DIAGNOSIS — Z1283 Encounter for screening for malignant neoplasm of skin: Secondary | ICD-10-CM | POA: Diagnosis not present

## 2015-06-23 DIAGNOSIS — M1711 Unilateral primary osteoarthritis, right knee: Secondary | ICD-10-CM | POA: Diagnosis not present

## 2015-06-23 DIAGNOSIS — M25561 Pain in right knee: Secondary | ICD-10-CM | POA: Diagnosis not present

## 2015-06-25 DIAGNOSIS — R2689 Other abnormalities of gait and mobility: Secondary | ICD-10-CM | POA: Diagnosis not present

## 2015-06-25 DIAGNOSIS — M25561 Pain in right knee: Secondary | ICD-10-CM | POA: Diagnosis not present

## 2015-06-25 DIAGNOSIS — M1711 Unilateral primary osteoarthritis, right knee: Secondary | ICD-10-CM | POA: Diagnosis not present

## 2015-07-07 DIAGNOSIS — M1711 Unilateral primary osteoarthritis, right knee: Secondary | ICD-10-CM | POA: Diagnosis not present

## 2015-07-07 DIAGNOSIS — M25561 Pain in right knee: Secondary | ICD-10-CM | POA: Diagnosis not present

## 2015-07-14 DIAGNOSIS — M1711 Unilateral primary osteoarthritis, right knee: Secondary | ICD-10-CM | POA: Diagnosis not present

## 2015-07-14 DIAGNOSIS — M25561 Pain in right knee: Secondary | ICD-10-CM | POA: Diagnosis not present

## 2015-07-21 DIAGNOSIS — M1711 Unilateral primary osteoarthritis, right knee: Secondary | ICD-10-CM | POA: Diagnosis not present

## 2015-07-21 DIAGNOSIS — M25561 Pain in right knee: Secondary | ICD-10-CM | POA: Diagnosis not present

## 2015-08-05 DIAGNOSIS — M25562 Pain in left knee: Secondary | ICD-10-CM | POA: Diagnosis not present

## 2015-08-05 DIAGNOSIS — M1712 Unilateral primary osteoarthritis, left knee: Secondary | ICD-10-CM | POA: Diagnosis not present

## 2015-08-11 DIAGNOSIS — M25562 Pain in left knee: Secondary | ICD-10-CM | POA: Diagnosis not present

## 2015-08-11 DIAGNOSIS — M1712 Unilateral primary osteoarthritis, left knee: Secondary | ICD-10-CM | POA: Diagnosis not present

## 2015-08-19 DIAGNOSIS — M25562 Pain in left knee: Secondary | ICD-10-CM | POA: Diagnosis not present

## 2015-08-19 DIAGNOSIS — M1712 Unilateral primary osteoarthritis, left knee: Secondary | ICD-10-CM | POA: Diagnosis not present

## 2015-08-25 DIAGNOSIS — M25562 Pain in left knee: Secondary | ICD-10-CM | POA: Diagnosis not present

## 2015-08-25 DIAGNOSIS — M1712 Unilateral primary osteoarthritis, left knee: Secondary | ICD-10-CM | POA: Diagnosis not present

## 2015-08-26 ENCOUNTER — Other Ambulatory Visit: Payer: Self-pay | Admitting: Internal Medicine

## 2015-08-26 DIAGNOSIS — Z1231 Encounter for screening mammogram for malignant neoplasm of breast: Secondary | ICD-10-CM

## 2015-09-01 DIAGNOSIS — M25562 Pain in left knee: Secondary | ICD-10-CM | POA: Diagnosis not present

## 2015-09-01 DIAGNOSIS — M1712 Unilateral primary osteoarthritis, left knee: Secondary | ICD-10-CM | POA: Diagnosis not present

## 2015-10-03 ENCOUNTER — Other Ambulatory Visit: Payer: Self-pay | Admitting: Internal Medicine

## 2015-10-03 ENCOUNTER — Ambulatory Visit
Admission: RE | Admit: 2015-10-03 | Discharge: 2015-10-03 | Disposition: A | Discharge status: Home / Self Care | Payer: Medicare Other | Source: Ambulatory Visit | Attending: Internal Medicine | Admitting: Internal Medicine

## 2015-10-03 DIAGNOSIS — R928 Other abnormal and inconclusive findings on diagnostic imaging of breast: Secondary | ICD-10-CM | POA: Insufficient documentation

## 2015-10-03 DIAGNOSIS — N6489 Other specified disorders of breast: Secondary | ICD-10-CM

## 2015-10-03 DIAGNOSIS — Z1231 Encounter for screening mammogram for malignant neoplasm of breast: Secondary | ICD-10-CM

## 2015-10-15 ENCOUNTER — Ambulatory Visit
Admission: RE | Admit: 2015-10-15 | Discharge: 2015-10-15 | Disposition: A | Discharge status: Home / Self Care | Payer: Medicare Other | Source: Ambulatory Visit | Attending: Internal Medicine | Admitting: Internal Medicine

## 2015-10-15 DIAGNOSIS — N6489 Other specified disorders of breast: Secondary | ICD-10-CM | POA: Insufficient documentation

## 2015-10-15 DIAGNOSIS — R928 Other abnormal and inconclusive findings on diagnostic imaging of breast: Secondary | ICD-10-CM | POA: Diagnosis not present

## 2015-11-05 ENCOUNTER — Telehealth: Payer: Self-pay | Admitting: Internal Medicine

## 2015-11-05 NOTE — Telephone Encounter (Signed)
Left message to call office

## 2015-11-05 NOTE — Telephone Encounter (Signed)
[facility] Patient Name: Jessica Sullivan DOB: February 26, 1946 Initial Comment Caller states she scratched herself on rusty chair, tetanus shot in 2014, wants to know if another is needed Nurse Assessment Nurse: Markus Daft, RN, Sherre Poot Date/Time (Eastern Time): 11/05/2015 9:03:41 AM Confirm and document reason for call. If symptomatic, describe symptoms. You must click the next button to save text entered. ---Caller states she scratched herself on rusty chair on Friday. A little red from being scratched. Has not gotten worse. She applied Neosporin after cleaning. Her last tetanus shot was in 2014, wants to know if another is needed? Has the patient traveled out of the country within the last 30 days? ---Not Applicable Does the patient have any new or worsening symptoms? ---Yes Will a triage be completed? ---No Select reason for no triage. ---Patient declined Please document clinical information provided and list any resource used. ---RN advised that she would need to have a tetanus shot within 5 years of the incident. Caller verbalized understanding. (per Web MD). Guidelines Guideline Title Affirmed Question Affirmed Notes Final Disposition User Clinical Call [place], RN, [employer]

## 2015-11-05 NOTE — Telephone Encounter (Signed)
Pt returned your call Best number [phone]

## 2015-11-05 NOTE — Telephone Encounter (Signed)
Spoke to pt

## 2015-11-05 NOTE — Telephone Encounter (Signed)
Please confirm for her--- she doesn't need a tetanus booster

## 2015-11-27 ENCOUNTER — Ambulatory Visit (INDEPENDENT_AMBULATORY_CARE_PROVIDER_SITE_OTHER): Payer: Medicare Other | Admitting: Obstetrics and Gynecology

## 2015-11-27 ENCOUNTER — Encounter: Payer: Self-pay | Admitting: Obstetrics and Gynecology

## 2015-11-27 VITALS — BP 155/82 | HR 85 | Wt 140.1 lb

## 2015-11-27 DIAGNOSIS — L900 Lichen sclerosus et atrophicus: Secondary | ICD-10-CM

## 2015-11-27 DIAGNOSIS — N763 Subacute and chronic vulvitis: Secondary | ICD-10-CM

## 2015-11-27 NOTE — Patient Instructions (Signed)
1. Continue using Temovate ointment to the vulva twice a week 2. Return in 8 weeks for follow-up

## 2015-11-27 NOTE — Progress Notes (Signed)
GYN ENCOUNTER NOTE  Subjective:       Jessica Sullivan is a 70 y.o. G49P1001 female is here for gynecologic evaluation of the following issues:  1. Vulvar irritation 2. History of lichen sclerosis  Patient present for follow up care of lichen sclerosis on vulva. Patient states symptoms are better and denies any genital-urinary symptoms. She is using Temovate twice weekly.   Gynecologic History No LMP recorded. Patient is postmenopausal. Contraception: post menopausal status Last Pap: 01/12/15 negative/negative. Results were: normal Last mammogram: 10/15/2015. Results were: BI-RADS 1, negative  Obstetric History OB History  Gravida Para Term Preterm AB Living  1 1 1     1   SAB TAB Ectopic Multiple Live Births          1    # Outcome Date GA Lbr Len/2nd Weight Sex Delivery Anes PTL Lv  1 Term 1978   8 lb 9.6 oz (3.901 kg) M CS-LVertical   LIV      Past Medical History:  Diagnosis Date  . Chronic venous insufficiency   . Hyperlipidemia     Past Surgical History:  Procedure Laterality Date  . [place]  . Chin lift  8/14  . DILATION AND CURETTAGE OF UTERUS  1990   year ago- polyp  . HAMMER TOE SURGERY  12/13  . KNEE ARTHROSCOPY Right 3/13   meniscus  . MPJ Release 2nd toe left    . VARICOSE VEIN SURGERY  2004, 2011    Current Outpatient Prescriptions on File Prior to Visit  Medication Sig Dispense Refill  . aspirin 81 MG tablet Take 81 mg by mouth daily.    . Biotin 10 MG CAPS Take by mouth.    . Calcium Carbonate-Vitamin D (CALCIUM + D PO) Take by mouth 2 (two) times daily.    . clobetasol ointment (TEMOVATE) AB-123456789 % Apply 1 application topically 2 (two) times daily. Use 2 times daily for 2 weeks. Then daily for 6 weeks. 30 g 6  . Glucosamine HCl (GLUCOSAMINE PO) Take by mouth daily.    . Multiple Vitamins-Minerals (CENTRUM SILVER ADULT 50+) TABS Take 1 tablet by mouth daily.    . Omega-3 Fatty Acids (FISH OIL PO) Take by mouth daily.    Marland Kitchen Resveratrol 250 MG  CAPS Take 2 capsules by mouth daily.     No current facility-administered medications on file prior to visit.     No Known Allergies  Social History   Social History  . Marital status: Married    Spouse name: N/A  . Number of children: 1  . Years of education: N/A   Occupational History  . 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 Smoking status: Never Smoker  . Smokeless tobacco: Never Used  . Alcohol use Yes     Comment: once a week  . Drug use: No  . Sexual activity: Yes    Birth control/ protection: Post-menopausal   Other Topics Concern  . Not on file   Social History Narrative   Has living will   Husband is health care POA   Would accept resuscitation attempts but no prolonged artificial ventilation   No tube feeds if cognitively unaware    Family History  Problem Relation Age of Onset  . Dementia Mother   . Cancer Father   . Cancer Brother     prostate/prostate  . Dementia Brother  corticobasilar degeneration  . Heart disease Neg Hx   . Diabetes Neg Hx   . Breast cancer Neg Hx   . Ovarian cancer Neg Hx   . Colon cancer Neg Hx     The following portions of the patient's history were reviewed and updated as appropriate: allergies, current medications, past family history, past medical history, past social history, past surgical history and problem list.  Review of Systems Review of Systems - per HPI  Objective:   BP (!) 155/82   Pulse 85   Wt 140 lb 1.6 oz (63.5 kg)   BMI 24.82 kg/m  CONSTITUTIONAL: Well-developed, well-nourished female in no acute distress.  HENT:  Normocephalic, atraumatic.  NECK: Normal range of motion, supple, no masses.  Normal thyroid.  SKIN: Skin is warm and dry. No rash noted. Not diaphoretic. No erythema. No pallor. [place]: Alert and oriented to person, place, and time. PSYCHIATRIC: Normal mood and affect. Normal behavior. Normal judgment and thought  content. CARDIOVASCULAR:Not Examined RESPIRATORY: Not Examined BREASTS: Not Examined ABDOMEN: Soft, non distended; Non tender.  No Organomegaly. PELVIC:  External Genitalia: Labial erythremia with mild tenderness to palpation. Agglutination of labia minora. No leukoplakia or epithelial skin breakdown.  BUS: Normal  Vagina: Not examined  Cervix: Not examined  Uterus: Not examined  Adnexa: Not examined  RV: Not examined  Bladder: Nontender MUSCULOSKELETAL: Normal range of motion. No tenderness.  No cyanosis, clubbing, or edema.     Assessment:   1. Lichen sclerosus, stable symptom wise, but with persistent chronic skin changes.  2. Chronic vulvitis     Plan:  Continue Temovate twice weekly. Follow-up in 8 weeks  A total of 15 minutes were spent face-to-face with the patient during this encounter and over half of that time dealt with counseling and coordination of care.   Grayland Ormond PA-S Brayton Mars, MD   I have seen, interviewed, and examined the patient in conjunction with the [facility].A. student and affirm the diagnosis and management plan. Patirica Longshore A. Faye Sanfilippo, MD, FACOG   Note: This dictation was prepared with Dragon dictation along with smaller phrase technology. Any transcriptional errors that result from this process are unintentional.

## 2015-12-02 DIAGNOSIS — Z23 Encounter for immunization: Secondary | ICD-10-CM | POA: Diagnosis not present

## 2015-12-23 ENCOUNTER — Encounter: Payer: Medicare Other | Admitting: Internal Medicine

## 2015-12-30 ENCOUNTER — Ambulatory Visit (INDEPENDENT_AMBULATORY_CARE_PROVIDER_SITE_OTHER): Payer: Medicare Other | Admitting: Internal Medicine

## 2015-12-30 ENCOUNTER — Encounter: Payer: Self-pay | Admitting: Internal Medicine

## 2015-12-30 VITALS — BP 124/78 | HR 69 | Temp 98.3°F | Ht 61.5 in | Wt 141.0 lb

## 2015-12-30 DIAGNOSIS — Z7189 Other specified counseling: Secondary | ICD-10-CM

## 2015-12-30 DIAGNOSIS — E785 Hyperlipidemia, unspecified: Secondary | ICD-10-CM | POA: Diagnosis not present

## 2015-12-30 DIAGNOSIS — Z1211 Encounter for screening for malignant neoplasm of colon: Secondary | ICD-10-CM

## 2015-12-30 DIAGNOSIS — Z0000 Encounter for general adult medical examination without abnormal findings: Secondary | ICD-10-CM | POA: Diagnosis not present

## 2015-12-30 LAB — CBC WITH DIFFERENTIAL/PLATELET
BASOS ABS: 0 10*3/uL (ref 0.0–0.1)
BASOS PCT: 0.5 % (ref 0.0–3.0)
EOS PCT: 1.1 % (ref 0.0–5.0)
Eosinophils Absolute: 0.1 10*3/uL (ref 0.0–0.7)
HEMATOCRIT: 43.1 % (ref 36.0–46.0)
Hemoglobin: 14.5 g/dL (ref 12.0–15.0)
LYMPHS PCT: 26.9 % (ref 12.0–46.0)
Lymphs Abs: 2.2 10*3/uL (ref 0.7–4.0)
MCHC: 33.7 g/dL (ref 30.0–36.0)
MCV: 87.9 fl (ref 78.0–100.0)
MONOS PCT: 6.8 % (ref 3.0–12.0)
Monocytes Absolute: 0.6 10*3/uL (ref 0.1–1.0)
NEUTROS ABS: 5.4 10*3/uL (ref 1.4–7.7)
Neutrophils Relative %: 64.7 % (ref 43.0–77.0)
PLATELETS: 272 10*3/uL (ref 150.0–400.0)
RBC: 4.9 Mil/uL (ref 3.87–5.11)
RDW: 13.7 % (ref 11.5–15.5)
WBC: 8.3 10*3/uL (ref 4.0–10.5)

## 2015-12-30 LAB — COMPREHENSIVE METABOLIC PANEL
ALT: 21 U/L (ref 0–35)
AST: 19 U/L (ref 0–37)
Albumin: 4.5 g/dL (ref 3.5–5.2)
Alkaline Phosphatase: 93 U/L (ref 39–117)
BILIRUBIN TOTAL: 0.3 mg/dL (ref 0.2–1.2)
BUN: 23 mg/dL (ref 6–23)
CALCIUM: 10 mg/dL (ref 8.4–10.5)
CHLORIDE: 104 meq/L (ref 96–112)
CO2: 29 meq/L (ref 19–32)
Creatinine, Ser: 0.68 mg/dL (ref 0.40–1.20)
GFR: 90.95 mL/min (ref >60.00)
Glucose, Bld: 99 mg/dL (ref 70–99)
POTASSIUM: 3.9 meq/L (ref 3.5–5.1)
Sodium: 140 mEq/L (ref 135–145)
Total Protein: 7.5 g/dL (ref 6.0–8.3)

## 2015-12-30 LAB — LIPID PANEL
CHOL/HDL RATIO: 3
Cholesterol: 208 mg/dL — ABNORMAL HIGH (ref 0–200)
HDL: 70.9 mg/dL (ref >39.00)
LDL Cholesterol: 118 mg/dL — ABNORMAL HIGH (ref 0–99)
NONHDL: 137.04
Triglycerides: 94 mg/dL (ref 0.0–149.0)
VLDL: 18.8 mg/dL (ref 0.0–40.0)

## 2015-12-30 NOTE — Assessment & Plan Note (Signed)
I have personally reviewed the Medicare Annual Wellness questionnaire and have noted 1. The patient's medical and social history 2. Their use of alcohol, tobacco or illicit drugs 3. Their current medications and supplements 4. The patient's functional ability including ADL's, fall risks, home safety risks and hearing or visual             impairment. 5. Diet and physical activities 6. Evidence for depression or mood disorders  The patients weight, height, BMI and visual acuity have been recorded in the chart I have made referrals, counseling and provided education to the patient based review of the above and I have provided the pt with a written personalized care plan for preventive services.  I have provided you with a copy of your personalized plan for preventive services. Please take the time to review along with your updated medication list.  UTD on imms Mammogram recently--false alarm Will do FIT again Discussed increasing walking again

## 2015-12-30 NOTE — Assessment & Plan Note (Signed)
Low risk profile with high HDL Will recheck

## 2015-12-30 NOTE — Assessment & Plan Note (Signed)
See social history 

## 2015-12-30 NOTE — Progress Notes (Signed)
Subjective:    Patient ID: Jessica Sullivan, female    DOB: [birth year], 70 y.o.   MRN: [id number]  HPI Here for Medicare wellness and follow up of chronic health conditions Reviewed form and advanced directives Reviewed other doctors Occasional alcohol--socially No tobacco Continues to walk regularly--has cut back some (discussed) Vision and hearing continue to be fine No falls No depression or anhedonia Independent with instrumental ADLs No sig memory issues  No new concerns Thankful for her good health  Keeps up with Dr D On the prescription cream for lichen sclerosus  Continues on fish oil No statin for cholesterol  Did go through the [facility] Rx for knee arthritis Also got plasma replacement Injections may have helped--but only temporary Was told knees were not that bad  Current Outpatient Prescriptions on File Prior to Visit  Medication Sig Dispense Refill  . aspirin 81 MG tablet Take 81 mg by mouth daily.    . Biotin 10 MG CAPS Take by mouth.    . Calcium Carbonate-Vitamin D (CALCIUM + D PO) Take by mouth 2 (two) times daily.    . clobetasol ointment (TEMOVATE) AB-123456789 % Apply 1 application topically 2 (two) times daily. Use 2 times daily for 2 weeks. Then daily for 6 weeks. (Patient taking differently: Apply 1 application topically 2 (two) times daily. Twice a week) 30 g 6  . Glucosamine HCl (GLUCOSAMINE PO) Take by mouth daily.    . Multiple Vitamins-Minerals (CENTRUM SILVER ADULT 50+) TABS Take 1 tablet by mouth daily.    . Omega-3 Fatty Acids (FISH OIL PO) Take by mouth daily.    Marland Kitchen Resveratrol 250 MG CAPS Take 2 capsules by mouth daily.     No current facility-administered medications on file prior to visit.     No Known Allergies  Past Medical History:  Diagnosis Date  . Chronic venous insufficiency   . Hyperlipidemia     Past Surgical History:  Procedure Laterality Date  . [place]  . Chin lift  8/14  . DILATION AND CURETTAGE OF  UTERUS  1990   year ago- polyp  . HAMMER TOE SURGERY  12/13  . KNEE ARTHROSCOPY Right 3/13   meniscus  . MPJ Release 2nd toe left    . VARICOSE VEIN SURGERY  2004, 2011    Family History  Problem Relation Age of Onset  . Dementia Mother   . Cancer Father   . Cancer Brother     prostate/prostate  . Dementia Brother     corticobasilar degeneration  . Heart disease Neg Hx   . Diabetes Neg Hx   . Breast cancer Neg Hx   . Ovarian cancer Neg Hx   . Colon cancer Neg Hx     Social History   Social History  . Marital status: Married    Spouse name: N/A  . Number of children: 1  . Years of education: N/A   Occupational History  . 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 Smoking status: Never Smoker  . Smokeless tobacco: Never Used  . Alcohol use Yes     Comment: once a week  . Drug use: No  . Sexual activity: Yes    Birth control/ protection: Post-menopausal   Other Topics Concern  . Not on file   Social History Narrative   Has living will   Husband is health care POA  Would accept resuscitation attempts but no prolonged artificial ventilation   No tube feeds if cognitively unaware   Review of Systems Appetite is slightly down--eats smaller meals more often Weight is stable Never has slept well--frequent awakening. Falls asleep on couch before going to bed Not tired during the day---more energy in the morning Wears seat belt Teeth okay--keeps up with dentist Bowels are fine--no blood in stool No urinary problems--no incontinence Some toe pain--at site of past surgery. Toe still not flat (wears a brace on foot) No skin problems--yearly with derm No heartburn or dysphagia Varicose veins have returned since the sclerotherapy--discussed support stockings    Objective:   Physical Exam  Constitutional: She is oriented to person, place, and time. She appears well-developed and well-nourished. No  distress.  HENT:  Mouth/Throat: Oropharynx is clear and moist. No oropharyngeal exudate.  Neck: Normal range of motion. Neck supple. No thyromegaly present.  Cardiovascular: Normal rate, regular rhythm, normal heart sounds and intact distal pulses.  Exam reveals no gallop.   No murmur heard. Pulmonary/Chest: Effort normal and breath sounds normal. No respiratory distress. She has no wheezes. She has no rales.  Abdominal: Soft. There is no tenderness.  Musculoskeletal: She exhibits no edema or tenderness.  Lymphadenopathy:    She has no cervical adenopathy.  Neurological: She is alert and oriented to person, place, and time.  President-- "Daisy Floro, Quay Burow" [id number] Recall 3/3  Skin: No rash noted. No erythema.  Psychiatric: She has a normal mood and affect. Her behavior is normal.          Assessment & Plan:

## 2015-12-30 NOTE — Progress Notes (Signed)
Pre visit review using our clinic review tool, if applicable. No additional management support is needed unless otherwise documented below in the visit note. 

## 2016-01-08 ENCOUNTER — Other Ambulatory Visit (INDEPENDENT_AMBULATORY_CARE_PROVIDER_SITE_OTHER): Payer: Medicare Other

## 2016-01-08 DIAGNOSIS — Z1211 Encounter for screening for malignant neoplasm of colon: Secondary | ICD-10-CM | POA: Diagnosis not present

## 2016-01-08 NOTE — Addendum Note (Signed)
Addended by: Ellamae Sia on: 01/08/2016 04:52 PM   Modules accepted: Orders

## 2016-01-09 LAB — FECAL OCCULT BLOOD, IMMUNOCHEMICAL: FECAL OCCULT BLD: NEGATIVE

## 2016-02-04 ENCOUNTER — Ambulatory Visit (INDEPENDENT_AMBULATORY_CARE_PROVIDER_SITE_OTHER): Payer: Medicare Other | Admitting: Obstetrics and Gynecology

## 2016-02-04 ENCOUNTER — Encounter: Payer: Self-pay | Admitting: Obstetrics and Gynecology

## 2016-02-04 VITALS — BP 153/73 | HR 86 | Ht 61.5 in | Wt 140.7 lb

## 2016-02-04 DIAGNOSIS — L900 Lichen sclerosus et atrophicus: Secondary | ICD-10-CM

## 2016-02-04 NOTE — Patient Instructions (Signed)
1. Continue with Temovate ointment 0.05% twice a week 2. Return in 6 months for follow-up or sooner if symptoms recur.

## 2016-02-04 NOTE — Progress Notes (Signed)
Chief complaint: 1. Lichen sclerosus  Patient presents for follow-up on chronic vulvar itching. Last visit was 2 months ago. She is using Temovate ointment 0.05% twice a week. Currently patient is asymptomatic.  Past medical history, past surgical history, problem list, medications, and allergies are reviewed  OBJECTIVE: BP (!) 153/73   Pulse 86   Ht 5' 1.5" (1.562 m)   Wt 140 lb 11.2 oz (63.8 kg)   BMI 26.15 kg/m  Pleasant well-appearing female in no acute distress. Alert and oriented. Pelvic:   External Genitalia: Bilateral Labial erythremia with no tenderness to palpation. Agglutination of labia minora. No leukoplakia or epithelial skin breakdown.             BUS: Normal             Vagina: Not examined             Cervix: Not examined             Uterus: Not examined             Adnexa: Not examined             RV: Not examined             Bladder: Nontender  ASSESSMENT: 1. Lichen sclerosus, asymptomatic, but with chronic stable skin changes  PLAN: 1. Continue with Temovate ointment 0.05% twice weekly 2. Return in 6 months for follow-up or sooner if symptoms recur  A total of 15 minutes were spent face-to-face with the patient during this encounter and over half of that time dealt with counseling and coordination of care.  Brayton Mars, MD  Note: This dictation was prepared with Dragon dictation along with smaller phrase technology. Any transcriptional errors that result from this process are unintentional.

## 2016-04-05 ENCOUNTER — Other Ambulatory Visit: Payer: Self-pay | Admitting: Obstetrics and Gynecology

## 2016-04-27 DIAGNOSIS — H25013 Cortical age-related cataract, bilateral: Secondary | ICD-10-CM | POA: Diagnosis not present

## 2016-07-14 DIAGNOSIS — I8393 Asymptomatic varicose veins of bilateral lower extremities: Secondary | ICD-10-CM | POA: Diagnosis not present

## 2016-07-14 DIAGNOSIS — D229 Melanocytic nevi, unspecified: Secondary | ICD-10-CM | POA: Diagnosis not present

## 2016-07-14 DIAGNOSIS — L821 Other seborrheic keratosis: Secondary | ICD-10-CM | POA: Diagnosis not present

## 2016-07-14 DIAGNOSIS — Z1283 Encounter for screening for malignant neoplasm of skin: Secondary | ICD-10-CM | POA: Diagnosis not present

## 2016-07-14 DIAGNOSIS — D485 Neoplasm of uncertain behavior of skin: Secondary | ICD-10-CM | POA: Diagnosis not present

## 2016-07-14 DIAGNOSIS — L719 Rosacea, unspecified: Secondary | ICD-10-CM | POA: Diagnosis not present

## 2016-07-29 DIAGNOSIS — S63275A Dislocation of unspecified interphalangeal joint of left ring finger, initial encounter: Secondary | ICD-10-CM | POA: Diagnosis not present

## 2016-08-03 ENCOUNTER — Encounter: Payer: Medicare Other | Admitting: Obstetrics and Gynecology

## 2016-08-04 ENCOUNTER — Encounter: Payer: Self-pay | Admitting: Obstetrics and Gynecology

## 2016-08-04 ENCOUNTER — Ambulatory Visit (INDEPENDENT_AMBULATORY_CARE_PROVIDER_SITE_OTHER): Payer: Medicare Other | Admitting: Obstetrics and Gynecology

## 2016-08-04 VITALS — BP 122/76 | HR 92 | Ht 61.5 in | Wt 140.8 lb

## 2016-08-04 DIAGNOSIS — Z79899 Other long term (current) drug therapy: Secondary | ICD-10-CM | POA: Diagnosis not present

## 2016-08-04 DIAGNOSIS — L900 Lichen sclerosus et atrophicus: Secondary | ICD-10-CM

## 2016-08-04 DIAGNOSIS — Z780 Asymptomatic menopausal state: Secondary | ICD-10-CM

## 2016-08-04 DIAGNOSIS — N763 Subacute and chronic vulvitis: Secondary | ICD-10-CM | POA: Diagnosis not present

## 2016-08-04 NOTE — Patient Instructions (Signed)
1. Return in 6 months for follow-up on lichen's process 2. Tdap  vaccination should be given every 10 years. 3. Continue using Temovate ointment twice a week to the vulva

## 2016-08-04 NOTE — Progress Notes (Signed)
Chief complaint: 1. Lichen sclerosus 2. Vaccine management  Patient is to have her first grandchild in 82 months. Last Tdap vaccine was 2 years ago.  Lichen sclerosus is clinically stable. Patient is not experiencing any increased itching, burning, or discomfort. No vaginal discharge. She is applying Temovate ointment to the vulva twice a week  Past medical history, past surgical history, problem list, medications, and allergies are reviewed  OBJECTIVE: BP 122/76   Pulse 92   Ht 5' 1.5" (1.562 m)   Wt 140 lb 12.8 oz (63.9 kg)   BMI 26.17 kg/m  Pleasant female in no acute distress. Alert and oriented. Pelvic: External Genitalia: Bilateral Labial erythremia with no tenderness to palpation. Agglutination of labia minora. No leukoplakia or epithelial skin breakdown. BUS: Normal Vagina: Not examined Cervix: Not examined Uterus: Not examined Adnexa: Not examined RV: Not examined Bladder: Nontender  ASSESSMENT: 1. Lichen sclerosus, asymptomatic, but with chronic stable skin changes (no significant changes from last exam)  PLAN: 1. Continue with Temovate ointment 0.05% twice weekly 2. Return in 6 months for follow-up or sooner if symptoms recur 3.Tdap booster is required every 10 years  A total of 15 minutes were spent face-to-face with the patient during this encounter and over half of that time dealt with counseling and coordination of care.  Brayton Mars, MD  Note: This dictation was prepared with Dragon dictation along with smaller phrase technology. Any transcriptional errors that result from this process are unintentional.

## 2016-10-15 ENCOUNTER — Ambulatory Visit (INDEPENDENT_AMBULATORY_CARE_PROVIDER_SITE_OTHER): Payer: Medicare Other | Admitting: Internal Medicine

## 2016-10-15 ENCOUNTER — Encounter: Payer: Self-pay | Admitting: Internal Medicine

## 2016-10-15 VITALS — BP 118/76 | HR 72 | Temp 98.0°F | Wt 140.0 lb

## 2016-10-15 DIAGNOSIS — R070 Pain in throat: Secondary | ICD-10-CM

## 2016-10-15 NOTE — Assessment & Plan Note (Signed)
Scratchy and post nasal drip Not sick---no indication for antibiotic/CXR Discussed trying antihistamines

## 2016-10-15 NOTE — Patient Instructions (Signed)
Please try over the counter loratadine 10mg  or cetirizine 10mg  daily for the symptoms. If that doesn't help, you can try fluticasone spray (also over the counter). Let me know if you get worse.

## 2016-10-15 NOTE — Progress Notes (Signed)
Subjective:    Patient ID: Jessica Sullivan, female    DOB: 08/03/1945, 71 y.o.   MRN: [id number]  HPI Here due to persistent scratchy throat  Scratchy throat for a while--has to clear a lot Occasional cough ?allergies/some post nasal drip Worried DIL is pregnant  No fever No swollen glands Cough drop helps Delsym this week--tries to limit (only occasional cough) No SOB Ear pain only once  Current Outpatient Prescriptions on File Prior to Visit  Medication Sig Dispense Refill  . aspirin 81 MG tablet Take 81 mg by mouth daily.    . Biotin 10 MG CAPS Take by mouth.    . Calcium Carbonate-Vitamin D (CALCIUM + D PO) Take by mouth 2 (two) times daily.    . clobetasol ointment (TEMOVATE) 0.05 % APPLY TWICE DAILY FOR 2 WEEKS THEN ONCE DAILY (Patient taking differently: twice a week) 30 g 2  . cyanocobalamin 100 MCG tablet Take 100 mcg by mouth daily.    . Glucosamine HCl (GLUCOSAMINE PO) Take by mouth daily.    . Multiple Vitamins-Minerals (CENTRUM SILVER ADULT 50+) TABS Take 1 tablet by mouth daily.    . Omega-3 Fatty Acids (FISH OIL PO) Take by mouth daily.    Marland Kitchen Resveratrol 250 MG CAPS Take 2 capsules by mouth daily.     No current facility-administered medications on file prior to visit.     Allergies  Allergen Reactions  . Poison Ivy Extract Itching and Rash    Past Medical History:  Diagnosis Date  . Chronic venous insufficiency   . Hyperlipidemia     Past Surgical History:  Procedure Laterality Date  . [place]  . Chin lift  8/14  . DILATION AND CURETTAGE OF UTERUS  1990   year ago- polyp  . HAMMER TOE SURGERY  12/13  . KNEE ARTHROSCOPY Right 3/13   meniscus  . MPJ Release 2nd toe left    . VARICOSE VEIN SURGERY  2004, 2011    Family History  Problem Relation Age of Onset  . Dementia Mother   . Cancer Father   . Cancer Brother        prostate/prostate  . Dementia Brother        corticobasilar degeneration  . Heart disease Neg Hx   .  Diabetes Neg Hx   . Breast cancer Neg Hx   . Ovarian cancer Neg Hx   . Colon cancer Neg Hx     Social History   Social History  . Marital status: Married    Spouse name: N/A  . Number of children: 1  . Years of education: N/A   Occupational History  . 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 Smoking status: Never Smoker  . Smokeless tobacco: Never Used  . Alcohol use Yes     Comment: once a week  . Drug use: No  . Sexual activity: Yes    Birth control/ protection: Post-menopausal   Other Topics Concern  . Not on file   Social History Narrative   Has living will   Husband is health care POA   Would accept resuscitation attempts but no prolonged artificial ventilation   No tube feeds if cognitively unaware   Review of Systems  No chills or sweats  Has lived here a few years Filters are changed regularly 3 dogs--none are new Did have some allergies as a child  Didn't try any antihistamines No diarrhea (husband with recent C dif) No heartburn or dysphagia     Objective:   Physical Exam  Constitutional: She appears well-nourished. No distress.  HENT:  Mouth/Throat: Oropharynx is clear and moist. No oropharyngeal exudate.  TMs normal Mild pale nasal congestion  Neck: No thyromegaly present.  Pulmonary/Chest: Effort normal and breath sounds normal. No respiratory distress. She has no wheezes. She has no rales.  Lymphadenopathy:    She has no cervical adenopathy.          Assessment & Plan:

## 2016-11-23 ENCOUNTER — Other Ambulatory Visit: Payer: Self-pay | Admitting: Internal Medicine

## 2016-11-23 DIAGNOSIS — Z1231 Encounter for screening mammogram for malignant neoplasm of breast: Secondary | ICD-10-CM

## 2016-12-01 DIAGNOSIS — Z23 Encounter for immunization: Secondary | ICD-10-CM | POA: Diagnosis not present

## 2016-12-13 ENCOUNTER — Telehealth: Payer: Self-pay

## 2016-12-13 NOTE — Telephone Encounter (Signed)
Pt has a new grandchild on the way and pt wants to know if the tetanus given on 11/28/12 had the pertussis in it. Per immunization list no just Td. Pt will get Tdap at pharmacy due to cost and pt will ck with pharmacy to see if has shingrix, if so pt will cb for order for shingrix.

## 2016-12-14 ENCOUNTER — Encounter: Payer: Self-pay | Admitting: Internal Medicine

## 2017-01-04 ENCOUNTER — Encounter: Payer: Medicare Other | Admitting: Internal Medicine

## 2017-02-02 ENCOUNTER — Encounter: Payer: Self-pay | Admitting: Obstetrics and Gynecology

## 2017-02-02 ENCOUNTER — Ambulatory Visit (INDEPENDENT_AMBULATORY_CARE_PROVIDER_SITE_OTHER): Payer: Medicare Other | Admitting: Obstetrics and Gynecology

## 2017-02-02 VITALS — BP 135/75 | HR 88 | Ht 61.5 in | Wt 142.2 lb

## 2017-02-02 DIAGNOSIS — L900 Lichen sclerosus et atrophicus: Secondary | ICD-10-CM

## 2017-02-02 DIAGNOSIS — Z780 Asymptomatic menopausal state: Secondary | ICD-10-CM | POA: Diagnosis not present

## 2017-02-02 DIAGNOSIS — N763 Subacute and chronic vulvitis: Secondary | ICD-10-CM

## 2017-02-02 NOTE — Patient Instructions (Signed)
1.  Continue Temovate ointment 0.05% topically to the vulva twice a week 2.  Return in 6 months for follow-up 3.  Return yearly for pelvic exam

## 2017-02-02 NOTE — Progress Notes (Signed)
Chief complaint: 1.  Lichen sclerosis 2.  Menopause  Patient presents for 64-month interval follow-up on lichen sclerosis.  She remains asymptomatic at this time.  She is using the Temovate ointment topically twice a week. Patient is requesting a pelvic exam to assess the ovaries.  Dr. Silvio Pate does do her annual physicals; however, he does not perform pelvic exams. Jessica Sullivan reports normal bowel function and normal bladder function.  She is not experiencing any unusual vaginal discharge or bleeding.  If she is not experiencing any pelvic pain.  Past medical history, past surgical history, problem list, medications, and allergies are reviewed  OBJECTIVE: BP 135/75   Pulse 88   Ht 5' 1.5" (1.562 m)   Wt 142 lb 3.2 oz (64.5 kg)   BMI 26.43 kg/m  Pleasant elderly female in no acute distress.  Alert and oriented. Abdomen: Soft, nontender without organomegaly; midline skin incision is well-healed. Pelvic exam: (Unchanged from last exam) External Genitalia: Bilateral Labial erythremia with no tenderness to palpation. Agglutination of labia minora. No leukoplakia or epithelial skin breakdown. BUS: Normal Vagina: Moderate atrophy; good vaginal vault support; no palpable masses Cervix: No cervical motion tenderness Uterus: Small midplane mobile nontender Adnexa: Nonpalpable and nontender RV: Slight perianal hyperemia; normal sphincter tone; no rectal masses Bladder: Nontender   ASSESSMENT: 1.  Lichen sclerosis, asymptomatic; chronic skin changes persist 2.  Menopausal state, asymptomatic; normal pelvic exam  PLAN: 1.  Continue Temovate ointment 0.05% topically twice a week 2.  Return for follow-up on lichen sclerosus in 6 months. 3.  Return annually for pelvic exam as part of health maintenance (not done through primary care) 4.  Patient is okay to get shingles vaccine at this time (expecting first  granddaughter in 2 weeks)  A total of 15 minutes were spent face-to-face with the patient during this encounter and over half of that time dealt with counseling and coordination of care.  Jessica Mars, MD  Note: This dictation was prepared with Dragon dictation along with smaller phrase technology. Any transcriptional errors that result from this process are unintentional.

## 2017-02-09 ENCOUNTER — Encounter: Payer: Medicare Other | Admitting: Obstetrics and Gynecology

## 2017-03-16 ENCOUNTER — Ambulatory Visit (INDEPENDENT_AMBULATORY_CARE_PROVIDER_SITE_OTHER): Payer: Medicare Other | Admitting: Internal Medicine

## 2017-03-16 ENCOUNTER — Encounter: Payer: Self-pay | Admitting: Internal Medicine

## 2017-03-16 VITALS — BP 128/76 | HR 72 | Temp 97.9°F | Ht 61.25 in | Wt 142.2 lb

## 2017-03-16 DIAGNOSIS — M170 Bilateral primary osteoarthritis of knee: Secondary | ICD-10-CM

## 2017-03-16 DIAGNOSIS — Z7189 Other specified counseling: Secondary | ICD-10-CM | POA: Diagnosis not present

## 2017-03-16 DIAGNOSIS — Z0000 Encounter for general adult medical examination without abnormal findings: Secondary | ICD-10-CM | POA: Diagnosis not present

## 2017-03-16 DIAGNOSIS — Z1211 Encounter for screening for malignant neoplasm of colon: Secondary | ICD-10-CM

## 2017-03-16 DIAGNOSIS — E785 Hyperlipidemia, unspecified: Secondary | ICD-10-CM

## 2017-03-16 HISTORY — DX: Bilateral primary osteoarthritis of knee: M17.0

## 2017-03-16 LAB — LIPID PANEL
CHOL/HDL RATIO: 3
Cholesterol: 190 mg/dL (ref 0–200)
HDL: 63.3 mg/dL (ref >39.00)
LDL Cholesterol: 110 mg/dL — ABNORMAL HIGH (ref 0–99)
NONHDL: 126.4
TRIGLYCERIDES: 80 mg/dL (ref 0.0–149.0)
VLDL: 16 mg/dL (ref 0.0–40.0)

## 2017-03-16 LAB — COMPREHENSIVE METABOLIC PANEL
ALT: 17 U/L (ref 0–35)
AST: 16 U/L (ref 0–37)
Albumin: 4.2 g/dL (ref 3.5–5.2)
Alkaline Phosphatase: 80 U/L (ref 39–117)
BUN: 24 mg/dL — ABNORMAL HIGH (ref 6–23)
CALCIUM: 9.5 mg/dL (ref 8.4–10.5)
CHLORIDE: 102 meq/L (ref 96–112)
CO2: 29 meq/L (ref 19–32)
Creatinine, Ser: 0.69 mg/dL (ref 0.40–1.20)
GFR: 89.12 mL/min (ref >60.00)
Glucose, Bld: 103 mg/dL — ABNORMAL HIGH (ref 70–99)
Potassium: 3.9 mEq/L (ref 3.5–5.1)
Sodium: 139 mEq/L (ref 135–145)
Total Bilirubin: 0.4 mg/dL (ref 0.2–1.2)
Total Protein: 6.9 g/dL (ref 6.0–8.3)

## 2017-03-16 LAB — CBC
HEMATOCRIT: 43.6 % (ref 36.0–46.0)
Hemoglobin: 14.5 g/dL (ref 12.0–15.0)
MCHC: 33.3 g/dL (ref 30.0–36.0)
MCV: 89.9 fl (ref 78.0–100.0)
PLATELETS: 266 10*3/uL (ref 150.0–400.0)
RBC: 4.85 Mil/uL (ref 3.87–5.11)
RDW: 13.8 % (ref 11.5–15.5)
WBC: 6.2 10*3/uL (ref 4.0–10.5)

## 2017-03-16 NOTE — Progress Notes (Signed)
Hearing Screening   125Hz  250Hz  500Hz  1000Hz  2000Hz  3000Hz  4000Hz  6000Hz  8000Hz   Right ear:   25 20 20  20     Left ear:   20 20 20  20     Vision Screening Comments: Completed at [facility]

## 2017-03-16 NOTE — Assessment & Plan Note (Signed)
Mild elevation but low risk profile On fish oil Will recheck

## 2017-03-16 NOTE — Assessment & Plan Note (Signed)
See social history 

## 2017-03-16 NOTE — Assessment & Plan Note (Signed)
I have personally reviewed the Medicare Annual Wellness questionnaire and have noted 1. The patient's medical and social history 2. Their use of alcohol, tobacco or illicit drugs 3. Their current medications and supplements 4. The patient's functional ability including ADL's, fall risks, home safety risks and hearing or visual             impairment. 5. Diet and physical activities 6. Evidence for depression or mood disorders  The patients weight, height, BMI and visual acuity have been recorded in the chart I have made referrals, counseling and provided education to the patient based review of the above and I have provided the pt with a written personalized care plan for preventive services.  I have provided you with a copy of your personalized plan for preventive services. Please take the time to review along with your updated medication list.  Getting mammogram soon Will do FIT Discussed resistance work UTD on [employer]

## 2017-03-16 NOTE — Assessment & Plan Note (Signed)
Mild Discussed quad strengthening

## 2017-03-16 NOTE — Progress Notes (Signed)
Subjective:    Patient ID: Jessica Sullivan, female    DOB: 16-Jul-1945, 72 y.o.   MRN: [id number]  HPI Here for Medicare wellness visit and follow up of chronic medical conditions Reviewed form and advanced directives Reviewed other doctors Rare alcohol No tobacco Tries to walk near every day Had 1 fall--tripped on unfinished sidewalk. Dislocated left 4th finger No depression or anhedonia. Recent became grandmother--- very excited about that Vision and hearing are fine Independent with instrumental ADLs No sig memory issues  Still has some mild knee problems No recent Rx for this Able to walk regularly --has 3 dogs she walks daily Uses a machine also Discussed doing some weight training as well  Multiple procedures for varicosities Has had some recurrence Some relief from foot/leg massager Wears support hose rarely--hard to get on  Known high cholesterol Takes the fish oil Low risk profile  Keeps up with Dr DeFrancesco--every 6 months  Current Outpatient Medications on File Prior to Visit  Medication Sig Dispense Refill  . aspirin 81 MG tablet Take 81 mg by mouth daily.    . Biotin 10 MG CAPS Take by mouth.    . Calcium Carbonate-Vitamin D (CALCIUM + D PO) Take by mouth 2 (two) times daily.    . clobetasol ointment (TEMOVATE) 0.05 % APPLY TWICE DAILY FOR 2 WEEKS THEN ONCE DAILY (Patient taking differently: twice a week) 30 g 2  . cyanocobalamin 100 MCG tablet Take 100 mcg by mouth daily.    . Glucosamine HCl (GLUCOSAMINE PO) Take by mouth daily.    . Multiple Vitamins-Minerals (CENTRUM SILVER ADULT 50+) TABS Take 1 tablet by mouth daily.    . Omega-3 Fatty Acids (FISH OIL PO) Take by mouth daily.    Marland Kitchen Resveratrol 250 MG CAPS Take 2 capsules by mouth daily.     No current facility-administered medications on file prior to visit.     Allergies  Allergen Reactions  . Poison Ivy Extract Itching and Rash    Past Medical History:  Diagnosis Date  . Chronic venous  insufficiency   . Hyperlipidemia     Past Surgical History:  Procedure Laterality Date  . [place]  . Chin lift  8/14  . DILATION AND CURETTAGE OF UTERUS  1990   year ago- polyp  . HAMMER TOE SURGERY  12/13  . KNEE ARTHROSCOPY Right 3/13   meniscus  . MPJ Release 2nd toe left    . VARICOSE VEIN SURGERY  2004, 2011    Family History  Problem Relation Age of Onset  . Dementia Mother   . Cancer Father   . Cancer Brother        prostate/prostate  . Dementia Brother        corticobasilar degeneration  . Parkinson's disease Brother   . Cancer Brother        prostate  . Heart disease Neg Hx   . Diabetes Neg Hx   . Breast cancer Neg Hx   . Ovarian cancer Neg Hx   . Colon cancer Neg Hx     Social History   Socioeconomic History  . Marital status: Married    Spouse name: Not on file  . Number of children: 1  . Years of education: Not on file  . Highest education level: Not on file  Social Needs  . Financial resource strain: Not on file  . Food insecurity - worry: Not on file  . Food insecurity - inability: Not on file  .  Transportation needs - medical: Not on file  . Transportation needs - non-medical: Not on file  Occupational History  . 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 Smoking status: Never Smoker  . Smokeless tobacco: Never Used  Substance and Sexual Activity  . Alcohol use: Yes    Comment: once a week  . Drug use: No  . Sexual activity: Yes    Birth control/protection: Post-menopausal  Other Topics Concern  . Not on file  Social History Narrative   Has living will   Husband is health care POA   Would accept resuscitation attempts but no prolonged artificial ventilation   No tube feeds if cognitively unaware   Review of Systems  Appetite is good Weight is stable Sleep is still not great. Has awakening (falls asleep on couch, then up again) Morning person Teeth are  fine. Keeps up with dentist Wears seat belt Bowels are fine--no blood No urinary problems Occasional heartburn-- no Rx. No dysphagia No chest pain or SOB No dizziness or syncope     Objective:   Physical Exam  Constitutional: She is oriented to person, place, and time. She appears well-developed. No distress.  HENT:  Mouth/Throat: Oropharynx is clear and moist. No oropharyngeal exudate.  Neck: No thyromegaly present.  Cardiovascular: Normal rate, regular rhythm, normal heart sounds and intact distal pulses. Exam reveals no gallop.  No murmur heard. Pulmonary/Chest: Effort normal and breath sounds normal. No respiratory distress. She has no wheezes. She has no rales.  Abdominal: Soft. She exhibits no distension. There is no tenderness.  Musculoskeletal: She exhibits no edema or tenderness.  No knee swelling and only slight crepitus  Lymphadenopathy:    She has no cervical adenopathy.  Neurological: She is alert and oriented to person, place, and time.  President--- "Daisy Floro, Obama, Bush" [phone] [id number] Recall 3/3  Skin: No rash noted. No erythema.  Psychiatric: She has a normal mood and affect.          Assessment & Plan:

## 2017-03-24 ENCOUNTER — Other Ambulatory Visit (INDEPENDENT_AMBULATORY_CARE_PROVIDER_SITE_OTHER): Payer: Medicare Other

## 2017-03-24 DIAGNOSIS — Z1211 Encounter for screening for malignant neoplasm of colon: Secondary | ICD-10-CM

## 2017-03-24 LAB — FECAL OCCULT BLOOD, IMMUNOCHEMICAL: Fecal Occult Bld: NEGATIVE

## 2017-04-04 ENCOUNTER — Ambulatory Visit
Admission: RE | Admit: 2017-04-04 | Discharge: 2017-04-04 | Disposition: A | Discharge status: Home / Self Care | Payer: Medicare Other | Source: Ambulatory Visit | Attending: Internal Medicine | Admitting: Internal Medicine

## 2017-04-04 DIAGNOSIS — Z1231 Encounter for screening mammogram for malignant neoplasm of breast: Secondary | ICD-10-CM | POA: Insufficient documentation

## 2017-04-11 IMAGING — MG MM DIGITAL DIAGNOSTIC UNILAT*L* W/ TOMO W/ CAD
9 series · 9 of 21 positions shown · non-contrast
Comparison: Previous exam(s).

CLINICAL DATA: Callback from screening mammogram

EXAM:
2D DIGITAL DIAGNOSTIC UNILATERAL LEFT MAMMOGRAM WITH CAD AND ADJUNCT
TOMO

[L MLO (1 of 2)]
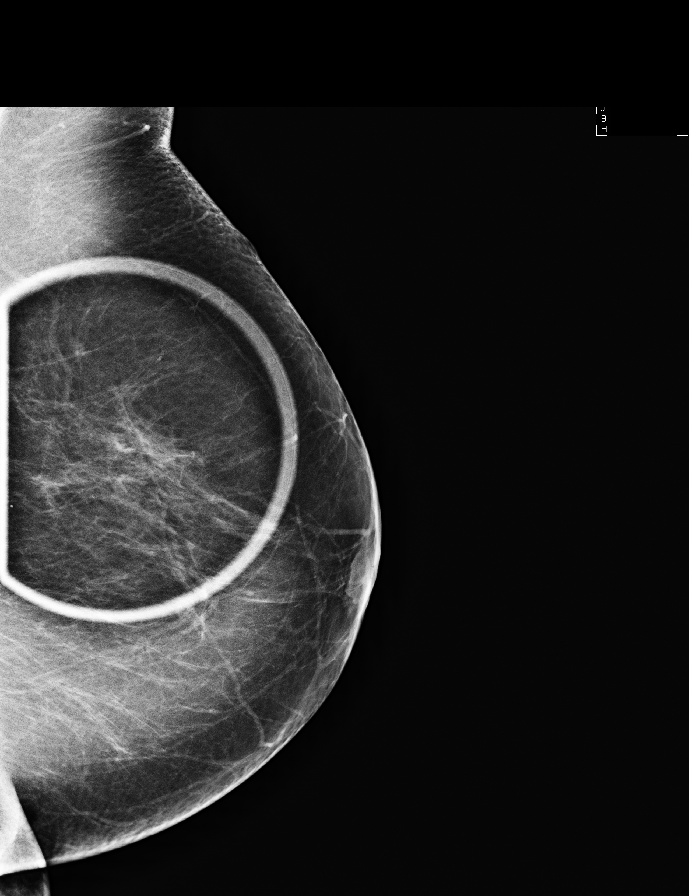

[L MLO (2 of 2)]
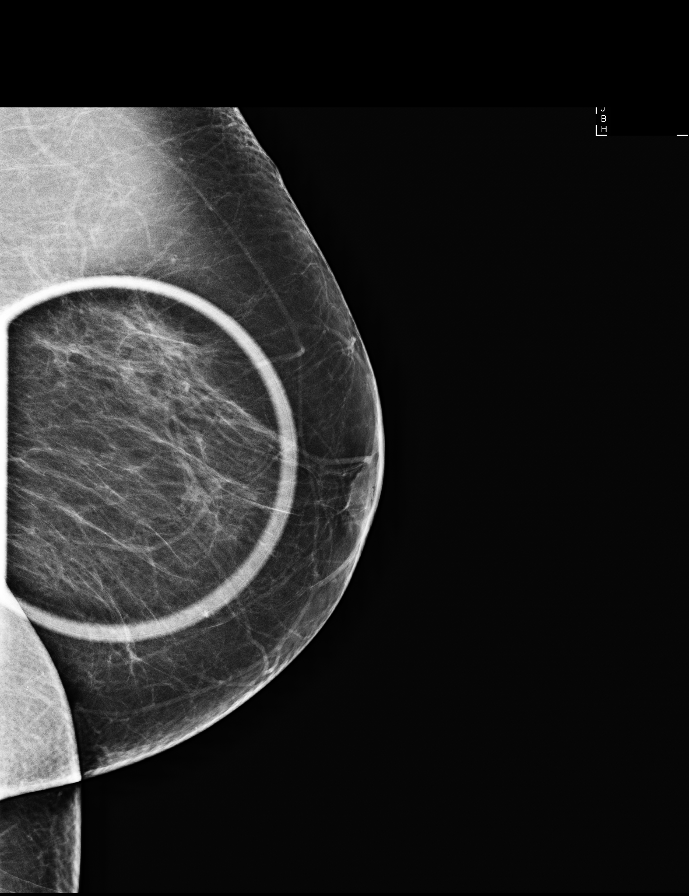

[L MLO synth-2D (1 of 2)]
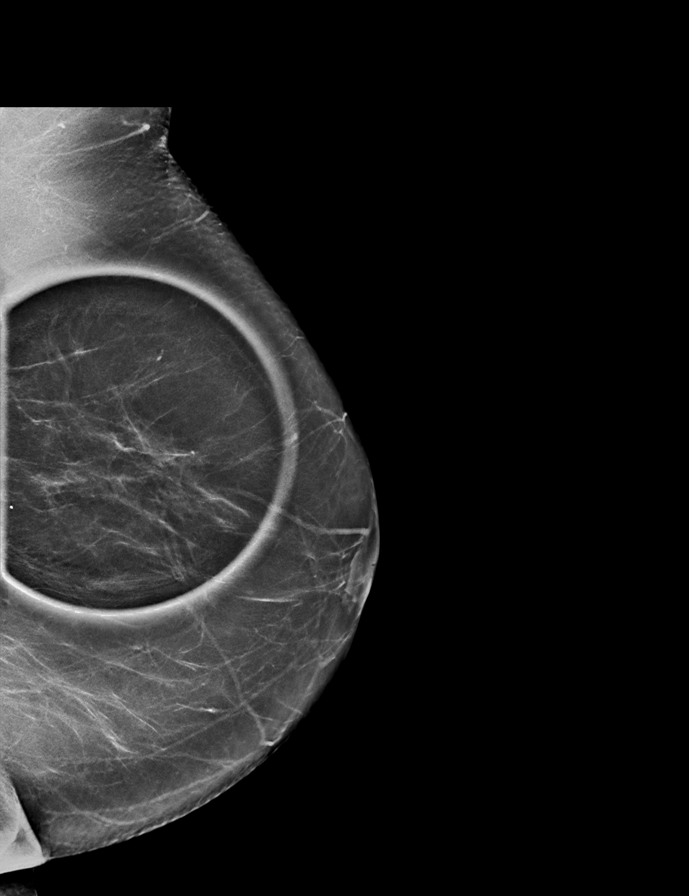

[L MLO synth-2D (2 of 2)]
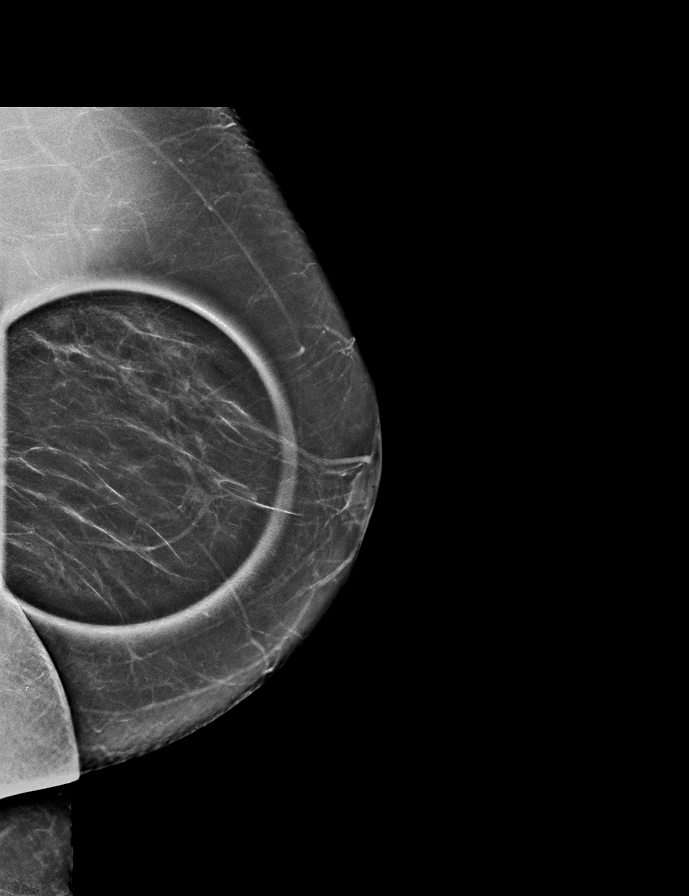

[L CC synth-2D]
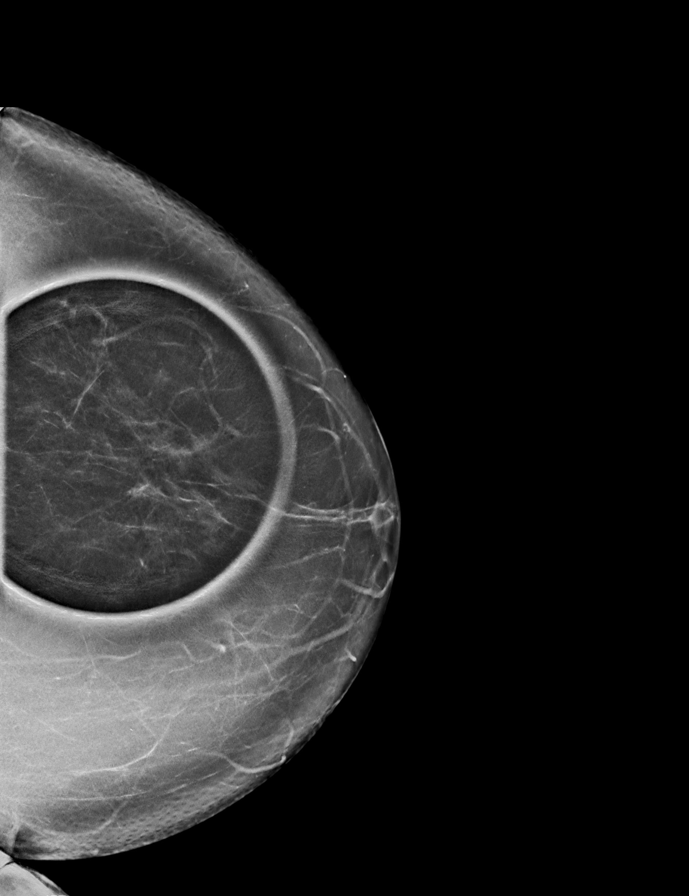

[L CC]
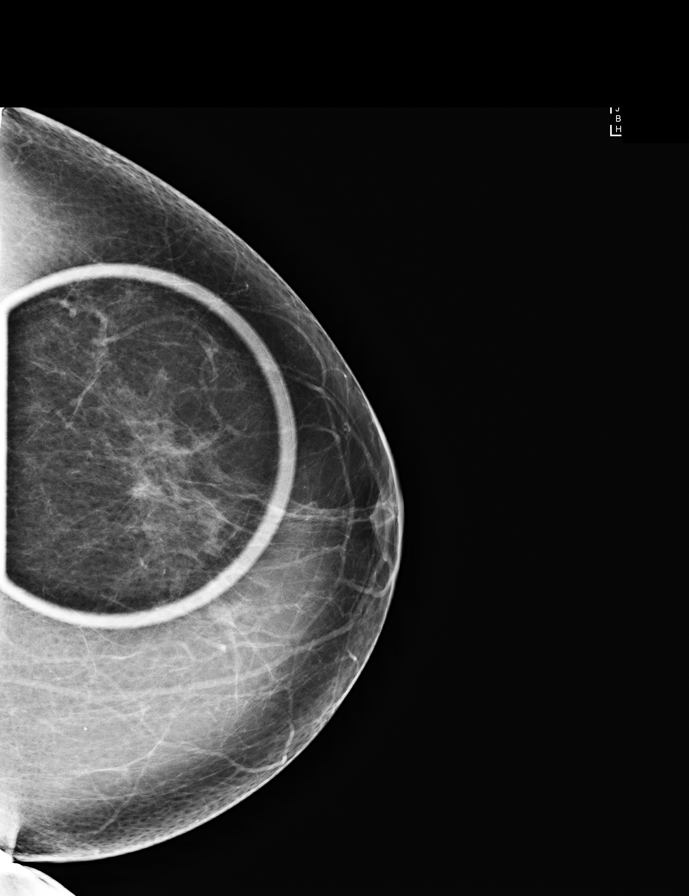

[L CC tomo · tomo slice 33/66.0]
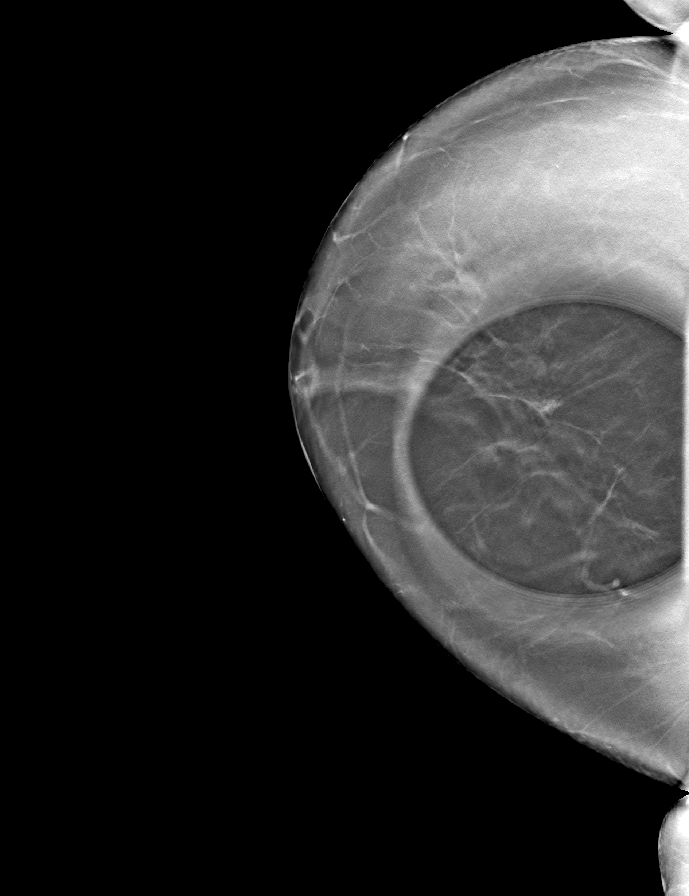

[L MLO tomo (1 of 2) · tomo slice 35/68.0]
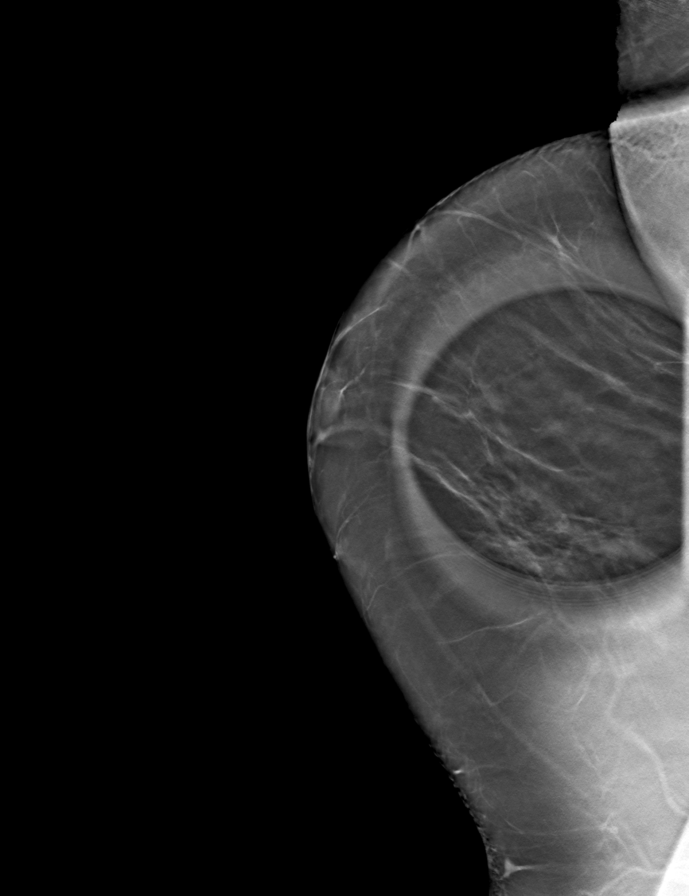

[L MLO tomo (2 of 2) · tomo slice 34/67.0]
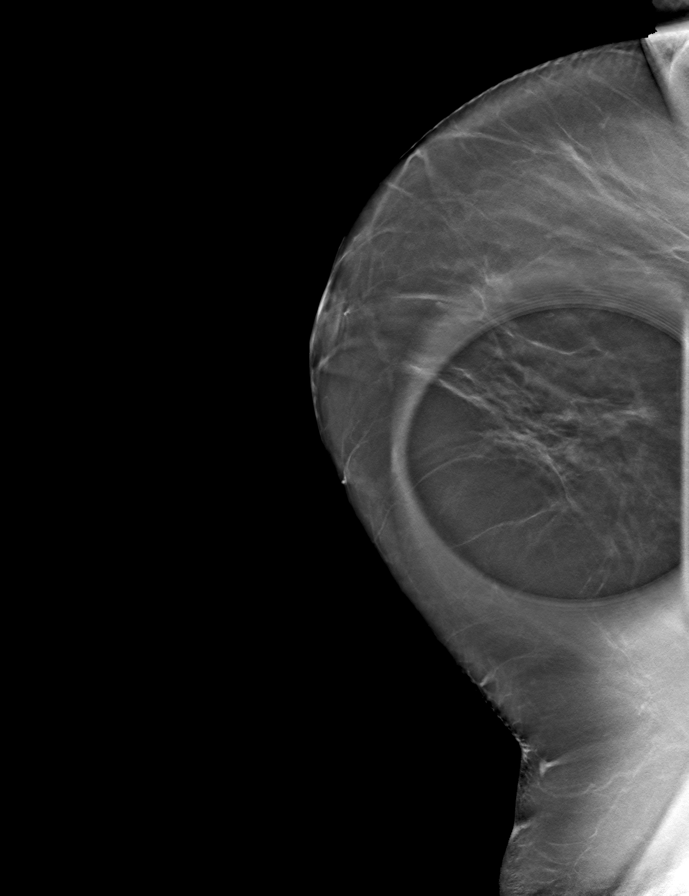

[9 of 21 positions shown; findings below may reference images not displayed]

ACR Breast Density Category b: There are scattered areas of
fibroglandular density.
FINDINGS: Cc and MLO spot-compression views of the left breast were performed
with tomosynthesis. No suspicious mass, calcifications, or other
abnormality is seen in the area of concern on the additional views.
The breast parenchymal pattern on the additional images appears
unchanged from mammogram dated 02/26/2008.

Mammographic images were processed with CAD.
IMPRESSION: No mammographic evidence of malignancy.

RECOMMENDATION:
Screening mammogram in one year.(Code:29-W-1Z2)

I have discussed the findings and recommendations with the patient.
Results were also provided in writing at the conclusion of the
visit. If applicable, a reminder letter will be sent to the patient
regarding the next appointment.

BI-RADS CATEGORY  1: Negative.

## 2017-06-22 DIAGNOSIS — H2513 Age-related nuclear cataract, bilateral: Secondary | ICD-10-CM | POA: Diagnosis not present

## 2017-07-26 ENCOUNTER — Encounter: Payer: Self-pay | Admitting: Obstetrics and Gynecology

## 2017-07-26 ENCOUNTER — Ambulatory Visit (INDEPENDENT_AMBULATORY_CARE_PROVIDER_SITE_OTHER): Payer: Medicare Other | Admitting: Obstetrics and Gynecology

## 2017-07-26 VITALS — BP 123/69 | HR 78 | Ht 61.25 in | Wt 142.8 lb

## 2017-07-26 DIAGNOSIS — L900 Lichen sclerosus et atrophicus: Secondary | ICD-10-CM | POA: Diagnosis not present

## 2017-07-26 DIAGNOSIS — Z780 Asymptomatic menopausal state: Secondary | ICD-10-CM | POA: Diagnosis not present

## 2017-07-26 MED ORDER — CLOBETASOL PROPIONATE 0.05 % EX OINT: TOPICAL_OINTMENT | CUTANEOUS | 2 refills | Status: DC

## 2017-07-26 NOTE — Patient Instructions (Signed)
1.  Continue with Temovate ointment 0.05% topically twice a week to the vulva 2.  Return in 6 months for follow-up on lichen sclerosus, vaginal atrophy, and annual pelvic exam. 3.  Continue with routine wellness checks with Dr. Silvio Pate

## 2017-07-26 NOTE — Progress Notes (Signed)
Chief complaint: 1.  Lichen sclerosis 2.  Menopause 3.  Vaginal atrophy  Patient presents for 57-month interval follow-up.  She has been using Temovate ointment 0.05% topically for management of vulvar lichen sclerosus and chronic vulvitis. Patient is asymptomatic at this time.  She is not having any vulvar itching, vulvar burning, vulvar pain.  She denies vaginal discharge.  She denies any UTI symptoms.  Bladder function is normal.  Bowel function is normal. Patient does see [place] annually for pelvic exam; this will be due in 6 months. Overall she is pleased with her health today.  She has a great regimen for applying the Temovate ointment twice a week and does not laps in that treatment protocol.  Past medical history, past surgical history, problem list, medications, and allergies are reviewed  OBJECTIVE: BP 123/69   Pulse 78   Ht 5' 1.25" (1.556 m)   Wt 142 lb 12.8 oz (64.8 kg)   BMI 26.76 kg/m   Pleasant well-appearing female in no acute distress.  Alert and oriented.  Affect is appropriate. Abdomen: Soft, nontender; no organomegaly Pelvic exam: External Genitalia: Bilateral Labial erythremia (minimal) with no tenderness to palpation. Agglutination of labia minora. No leukoplakia or epithelial skin breakdown. BUS: Normal Vagina: Moderate atrophy Cervix: Not examined Uterus: Not examined Adnexa: Not examined RV: Resolution of minimal perianal hyperemia seen on last exam; internal exam deferred Bladder: Nontender   ASSESSMENT: 1.  Lichen sclerosis, stable 2.  Vaginal atrophy, stable  PLAN: 1.  Continue with Temovate ointment 0.05% topically twice a week 2.  Return in 6 months for follow-up including pelvic exam  A total of 15 minutes were spent face-to-face with the patient during this encounter and over half of that time dealt with counseling and coordination of care.  Brayton Mars,  MD  Note: This dictation was prepared with Dragon dictation along with smaller phrase technology. Any transcriptional errors that result from this process are unintentional.

## 2017-08-03 DIAGNOSIS — D229 Melanocytic nevi, unspecified: Secondary | ICD-10-CM | POA: Diagnosis not present

## 2017-08-03 DIAGNOSIS — D1800 Hemangioma unspecified site: Secondary | ICD-10-CM | POA: Diagnosis not present

## 2017-08-03 DIAGNOSIS — Z1283 Encounter for screening for malignant neoplasm of skin: Secondary | ICD-10-CM | POA: Diagnosis not present

## 2017-08-03 DIAGNOSIS — I8393 Asymptomatic varicose veins of bilateral lower extremities: Secondary | ICD-10-CM | POA: Diagnosis not present

## 2017-08-03 DIAGNOSIS — L578 Other skin changes due to chronic exposure to nonionizing radiation: Secondary | ICD-10-CM | POA: Diagnosis not present

## 2017-08-03 DIAGNOSIS — D2330 Other benign neoplasm of skin of unspecified part of face: Secondary | ICD-10-CM | POA: Diagnosis not present

## 2017-08-03 DIAGNOSIS — D225 Melanocytic nevi of trunk: Secondary | ICD-10-CM | POA: Diagnosis not present

## 2017-08-03 DIAGNOSIS — D485 Neoplasm of uncertain behavior of skin: Secondary | ICD-10-CM | POA: Diagnosis not present

## 2017-08-03 DIAGNOSIS — D3612 Benign neoplasm of peripheral nerves and autonomic nervous system, upper limb, including shoulder: Secondary | ICD-10-CM | POA: Diagnosis not present

## 2017-08-03 DIAGNOSIS — L812 Freckles: Secondary | ICD-10-CM | POA: Diagnosis not present

## 2017-08-03 DIAGNOSIS — L821 Other seborrheic keratosis: Secondary | ICD-10-CM | POA: Diagnosis not present

## 2017-09-09 ENCOUNTER — Encounter: Payer: Self-pay | Admitting: Internal Medicine

## 2017-09-09 ENCOUNTER — Other Ambulatory Visit: Payer: Self-pay | Admitting: Orthopedic Surgery

## 2017-09-09 DIAGNOSIS — M1711 Unilateral primary osteoarthritis, right knee: Secondary | ICD-10-CM | POA: Diagnosis not present

## 2017-09-09 DIAGNOSIS — M25561 Pain in right knee: Secondary | ICD-10-CM | POA: Diagnosis not present

## 2017-09-12 ENCOUNTER — Ambulatory Visit
Admission: RE | Admit: 2017-09-12 | Discharge: 2017-09-12 | Disposition: A | Discharge status: Home / Self Care | Payer: Medicare Other | Source: Ambulatory Visit | Attending: Orthopedic Surgery | Admitting: Orthopedic Surgery

## 2017-09-12 DIAGNOSIS — M1711 Unilateral primary osteoarthritis, right knee: Secondary | ICD-10-CM | POA: Diagnosis not present

## 2017-09-28 ENCOUNTER — Other Ambulatory Visit: Payer: Self-pay

## 2017-09-28 ENCOUNTER — Encounter
Admission: RE | Admit: 2017-09-28 | Discharge: 2017-09-28 | Disposition: A | Discharge status: Home / Self Care | Payer: Medicare Other | Source: Ambulatory Visit | Attending: Orthopedic Surgery | Admitting: Orthopedic Surgery

## 2017-09-28 DIAGNOSIS — Z01818 Encounter for other preprocedural examination: Secondary | ICD-10-CM | POA: Diagnosis not present

## 2017-09-28 DIAGNOSIS — Z79899 Other long term (current) drug therapy: Secondary | ICD-10-CM | POA: Diagnosis not present

## 2017-09-28 DIAGNOSIS — Z01810 Encounter for preprocedural cardiovascular examination: Secondary | ICD-10-CM | POA: Insufficient documentation

## 2017-09-28 DIAGNOSIS — M1711 Unilateral primary osteoarthritis, right knee: Secondary | ICD-10-CM | POA: Insufficient documentation

## 2017-09-28 DIAGNOSIS — Z7982 Long term (current) use of aspirin: Secondary | ICD-10-CM | POA: Insufficient documentation

## 2017-09-28 LAB — BASIC METABOLIC PANEL
Anion gap: 6 (ref 5–15)
BUN: 24 mg/dL — ABNORMAL HIGH (ref 8–23)
CALCIUM: 9.2 mg/dL (ref 8.9–10.3)
CO2: 29 mmol/L (ref 22–32)
CREATININE: 0.82 mg/dL (ref 0.44–1.00)
Chloride: 105 mmol/L (ref 98–111)
GFR calc non Af Amer: >60 mL/min (ref >60)
Glucose, Bld: 135 mg/dL — ABNORMAL HIGH (ref 70–99)
Potassium: 3.3 mmol/L — ABNORMAL LOW (ref 3.5–5.1)
SODIUM: 140 mmol/L (ref 135–145)

## 2017-09-28 LAB — CBC
HCT: 41.2 % (ref 35.0–47.0)
Hemoglobin: 14.4 g/dL (ref 12.0–16.0)
MCH: 30.7 pg (ref 26.0–34.0)
MCHC: 35 g/dL (ref 32.0–36.0)
MCV: 87.8 fL (ref 80.0–100.0)
Platelets: 258 10*3/uL (ref 150–440)
RBC: 4.7 MIL/uL (ref 3.80–5.20)
RDW: 13.6 % (ref 11.5–14.5)
WBC: 7.8 10*3/uL (ref 3.6–11.0)

## 2017-09-28 LAB — URINALYSIS, ROUTINE W REFLEX MICROSCOPIC
BACTERIA UA: NONE SEEN
BILIRUBIN URINE: NEGATIVE
Glucose, UA: >=500 mg/dL — AB
HGB URINE DIPSTICK: NEGATIVE
KETONES UR: NEGATIVE mg/dL
Leukocytes, UA: NEGATIVE
Nitrite: NEGATIVE
PH: 5 (ref 5.0–8.0)
Protein, ur: NEGATIVE mg/dL
SPECIFIC GRAVITY, URINE: 1.023 (ref 1.005–1.030)

## 2017-09-28 LAB — TYPE AND SCREEN
ABO/RH(D): B POS
ANTIBODY SCREEN: NEGATIVE

## 2017-09-28 LAB — SURGICAL PCR SCREEN
MRSA, PCR: NEGATIVE
STAPHYLOCOCCUS AUREUS: NEGATIVE

## 2017-09-28 LAB — APTT: aPTT: 29 seconds (ref 24–36)

## 2017-09-28 LAB — SEDIMENTATION RATE: Sed Rate: 6 mm/hr (ref 0–30)

## 2017-09-28 LAB — PROTIME-INR
INR: 1.03
PROTHROMBIN TIME: 13.4 s (ref 11.4–15.2)

## 2017-09-28 NOTE — Patient Instructions (Signed)
Your procedure is scheduled on: 10/11/17 Report to Day Surgery. [facility] SECOND FLOOR To find out your arrival time please call [phone] between 1PM - 3PM on 10/10/17.  Remember: Instructions that are not followed completely may result in serious medical risk,  up to and including death, or upon the discretion of your surgeon and anesthesiologist your  surgery may need to be rescheduled.     _X__ 1. Do not eat food after midnight the night before your procedure.                 No gum chewing or hard candies. You may drink clear liquids up to 2 hours                 before you are scheduled to arrive for your surgery- DO not drink clear                 liquids within 2 hours of the start of your surgery.                 Clear Liquids include:  water, apple juice without pulp, clear carbohydrate                 drink such as Clearfast of Gatorade, Black Coffee or Tea (Do not add                 anything to coffee or tea).  __X__2.  On the morning of surgery brush your teeth with toothpaste and water, you                may rinse your mouth with mouthwash if you wish.  Do not swallow any toothpaste of mouthwash.     _X__ 3.  No Alcohol for 24 hours before or after surgery.   _X__ 4.  Do Not Smoke or use e-cigarettes For 24 Hours Prior to Your Surgery.                 Do not use any chewable tobacco products for at least 6 hours prior to                 surgery.  ____  5.  Bring all medications with you on the day of surgery if instructed.   __X__  6.  Notify your doctor if there is any change in your medical condition      (cold, fever, infections).     Do not wear jewelry, make-up, hairpins, clips or nail polish. Do not wear lotions, powders, or perfumes. You may wear deodorant. Do not shave 48 hours prior to surgery. Men may shave face and neck. Do not bring valuables to the hospital.    [facility] is not responsible for any belongings or  valuables.  Contacts, dentures or bridgework may not be worn into surgery. Leave your suitcase in the car. After surgery it may be brought to your room. For patients admitted to the hospital, discharge time is determined by your treatment team.   Patients discharged the day of surgery will not be allowed to drive home.   Please read over the following fact sheets that you were given:   Surgical Site Infection Prevention / MRSA  ____ Take these medicines the morning of surgery with A SIP OF WATER:    1.NONE  2.   3.   4.  5.  6.  ____ Fleet Enema (as directed)   _X___ Use CHG Soap as directed  ____ Use inhalers on the day of surgery  ____ Stop metformin 2 days prior to surgery    ____ Take 1/2 of usual insulin dose the night before surgery. No insulin the morning          of surgery.   __X__ Stop Coumadin/Plavix/aspirin on     STOP ASPIRIN 1 WEEK BEFORE SURGERY  __X__ Stop Anti-inflammatories on   NO NSAIDS UNTIL AFTER SURGERY   TYLENOL OK TO TAKE __X__ Stop supplements until after surgery.   STOP GLUCOSAMINE AND OMEGA FISH OIL TODAY  ____ Bring C-Pap to the hospital.

## 2017-09-28 NOTE — Pre-Procedure Instructions (Signed)
LABS WITH KT 3.3 FAXED TO DR [facility]. ALSO FAXED UA WITH GLU >500 AND MET B WITH GLU 135. CONTACTED LAB TO SEE IF ISSUE WITH UA RESULTS

## 2017-09-29 LAB — URINE CULTURE: Culture: NO GROWTH

## 2017-09-30 DIAGNOSIS — M1711 Unilateral primary osteoarthritis, right knee: Secondary | ICD-10-CM | POA: Diagnosis not present

## 2017-10-10 MED ORDER — TRANEXAMIC ACID 1000 MG/10ML IV SOLN
1000.0000 mg | INTRAVENOUS | Status: DC
  Filled 2017-10-10: qty 10

## 2017-10-10 MED ORDER — CEFAZOLIN SODIUM-DEXTROSE 2-4 GM/100ML-% IV SOLN
2.0000 g | Freq: Once | INTRAVENOUS | Status: AC
  Administered 2017-10-11: 2 g via INTRAVENOUS

## 2017-10-11 ENCOUNTER — Inpatient Hospital Stay: Payer: Medicare Other

## 2017-10-11 ENCOUNTER — Inpatient Hospital Stay: Payer: Medicare Other | Admitting: Anesthesiology

## 2017-10-11 ENCOUNTER — Inpatient Hospital Stay
Admission: RE | Admit: 2017-10-11 | Discharge: 2017-10-13 | DRG: 470 | Disposition: A | Discharge status: Home Health | Payer: Medicare Other | Source: Ambulatory Visit | Attending: Orthopedic Surgery | Admitting: Orthopedic Surgery

## 2017-10-11 ENCOUNTER — Encounter: Payer: Self-pay | Admitting: *Deleted

## 2017-10-11 ENCOUNTER — Other Ambulatory Visit: Payer: Self-pay

## 2017-10-11 ENCOUNTER — Encounter
Admission: RE | Disposition: A | Discharge status: Home Health | Payer: Self-pay | Source: Ambulatory Visit | Attending: Orthopedic Surgery

## 2017-10-11 DIAGNOSIS — Z471 Aftercare following joint replacement surgery: Secondary | ICD-10-CM | POA: Diagnosis not present

## 2017-10-11 DIAGNOSIS — M25561 Pain in right knee: Secondary | ICD-10-CM | POA: Diagnosis not present

## 2017-10-11 DIAGNOSIS — Z9109 Other allergy status, other than to drugs and biological substances: Secondary | ICD-10-CM

## 2017-10-11 DIAGNOSIS — Z96651 Presence of right artificial knee joint: Secondary | ICD-10-CM | POA: Diagnosis not present

## 2017-10-11 DIAGNOSIS — I872 Venous insufficiency (chronic) (peripheral): Secondary | ICD-10-CM | POA: Diagnosis present

## 2017-10-11 DIAGNOSIS — Z885 Allergy status to narcotic agent status: Secondary | ICD-10-CM

## 2017-10-11 DIAGNOSIS — E785 Hyperlipidemia, unspecified: Secondary | ICD-10-CM | POA: Diagnosis present

## 2017-10-11 DIAGNOSIS — M1711 Unilateral primary osteoarthritis, right knee: Secondary | ICD-10-CM | POA: Diagnosis not present

## 2017-10-11 DIAGNOSIS — Z7982 Long term (current) use of aspirin: Secondary | ICD-10-CM

## 2017-10-11 DIAGNOSIS — Z91018 Allergy to other foods: Secondary | ICD-10-CM

## 2017-10-11 DIAGNOSIS — G8918 Other acute postprocedural pain: Secondary | ICD-10-CM

## 2017-10-11 HISTORY — PX: TOTAL KNEE ARTHROPLASTY: SHX125

## 2017-10-11 LAB — ABO/RH: ABO/RH(D): B POS

## 2017-10-11 LAB — POCT I-STAT 4, (NA,K, GLUC, HGB,HCT)
GLUCOSE: 105 mg/dL — AB (ref 70–99)
HEMATOCRIT: 40 % (ref 36.0–46.0)
HEMOGLOBIN: 13.6 g/dL (ref 12.0–15.0)
Potassium: 3.8 mmol/L (ref 3.5–5.1)
Sodium: 139 mmol/L (ref 135–145)

## 2017-10-11 SURGERY — ARTHROPLASTY, KNEE, TOTAL
Anesthesia: Spinal | Site: Knee | Laterality: Right | Wound class: Clean

## 2017-10-11 MED ORDER — METOCLOPRAMIDE HCL 5 MG/ML IJ SOLN: 5.0000 mg | Freq: Three times a day (TID) | INTRAMUSCULAR | Status: DC | PRN

## 2017-10-11 MED ORDER — OCUVITE-LUTEIN PO CAPS
1.0000 | ORAL_CAPSULE | Freq: Two times a day (BID) | ORAL | Status: DC
  Administered 2017-10-11 – 2017-10-13 (×4): 1 via ORAL
  Filled 2017-10-11 (×6): qty 1

## 2017-10-11 MED ORDER — METOCLOPRAMIDE HCL 10 MG PO TABS: 5.0000 mg | ORAL_TABLET | Freq: Three times a day (TID) | ORAL | Status: DC | PRN

## 2017-10-11 MED ORDER — ALUM & MAG HYDROXIDE-SIMETH 200-200-20 MG/5ML PO SUSP: 30.0000 mL | ORAL | Status: DC | PRN

## 2017-10-11 MED ORDER — DOCUSATE SODIUM 100 MG PO CAPS
100.0000 mg | ORAL_CAPSULE | Freq: Two times a day (BID) | ORAL | Status: DC
  Administered 2017-10-11 – 2017-10-13 (×5): 100 mg via ORAL
  Filled 2017-10-11 (×5): qty 1

## 2017-10-11 MED ORDER — ACETAMINOPHEN 500 MG PO TABS
500.0000 mg | ORAL_TABLET | Freq: Four times a day (QID) | ORAL | Status: AC
  Administered 2017-10-11 – 2017-10-12 (×4): 500 mg via ORAL
  Filled 2017-10-11 (×4): qty 1

## 2017-10-11 MED ORDER — TRANEXAMIC ACID 1000 MG/10ML IV SOLN
INTRAVENOUS | Status: DC | PRN
  Administered 2017-10-11: 1000 mg via INTRAVENOUS

## 2017-10-11 MED ORDER — BUPIVACAINE HCL (PF) 0.5 % IJ SOLN
INTRAMUSCULAR | Status: DC | PRN
  Administered 2017-10-11: 3 mL

## 2017-10-11 MED ORDER — ADULT MULTIVITAMIN W/MINERALS CH
1.0000 | ORAL_TABLET | Freq: Every day | ORAL | Status: DC
  Administered 2017-10-11 – 2017-10-13 (×3): 1 via ORAL
  Filled 2017-10-11 (×2): qty 1

## 2017-10-11 MED ORDER — ONDANSETRON HCL 4 MG PO TABS: 4.0000 mg | ORAL_TABLET | Freq: Four times a day (QID) | ORAL | Status: DC | PRN

## 2017-10-11 MED ORDER — DEXAMETHASONE SODIUM PHOSPHATE 10 MG/ML IJ SOLN
INTRAMUSCULAR | Status: AC
  Filled 2017-10-11: qty 1

## 2017-10-11 MED ORDER — PROPOFOL 500 MG/50ML IV EMUL
INTRAVENOUS | Status: AC
  Filled 2017-10-11: qty 50

## 2017-10-11 MED ORDER — CALCIUM CARBONATE-VITAMIN D 500-200 MG-UNIT PO TABS
1.0000 | ORAL_TABLET | Freq: Two times a day (BID) | ORAL | Status: DC
  Administered 2017-10-11 – 2017-10-13 (×5): 1 via ORAL
  Filled 2017-10-11 (×5): qty 1

## 2017-10-11 MED ORDER — EPHEDRINE SULFATE 50 MG/ML IJ SOLN
INTRAMUSCULAR | Status: DC | PRN
  Administered 2017-10-11: 10 mg via INTRAVENOUS
  Administered 2017-10-11: 5 mg via INTRAVENOUS

## 2017-10-11 MED ORDER — ACETAMINOPHEN 325 MG PO TABS
325.0000 mg | ORAL_TABLET | Freq: Four times a day (QID) | ORAL | Status: DC | PRN
  Administered 2017-10-13: 650 mg via ORAL
  Filled 2017-10-11: qty 2

## 2017-10-11 MED ORDER — ONDANSETRON HCL 4 MG/2ML IJ SOLN
INTRAMUSCULAR | Status: DC | PRN
  Administered 2017-10-11: 4 mg via INTRAVENOUS

## 2017-10-11 MED ORDER — VITAMIN B-12 100 MCG PO TABS
100.0000 ug | ORAL_TABLET | Freq: Every day | ORAL | Status: DC
  Administered 2017-10-11 – 2017-10-13 (×3): 100 ug via ORAL
  Filled 2017-10-11 (×3): qty 1

## 2017-10-11 MED ORDER — LACTATED RINGERS IV SOLN
INTRAVENOUS | Status: DC
  Administered 2017-10-11: 09:00:00 via INTRAVENOUS

## 2017-10-11 MED ORDER — CEFAZOLIN SODIUM-DEXTROSE 2-4 GM/100ML-% IV SOLN
INTRAVENOUS | Status: AC
  Filled 2017-10-11: qty 100

## 2017-10-11 MED ORDER — PHENOL 1.4 % MT LIQD
1.0000 | OROMUCOSAL | Status: DC | PRN
  Filled 2017-10-11: qty 177

## 2017-10-11 MED ORDER — ASPIRIN 81 MG PO CHEW
81.0000 mg | CHEWABLE_TABLET | Freq: Two times a day (BID) | ORAL | Status: DC
  Administered 2017-10-11 – 2017-10-13 (×4): 81 mg via ORAL
  Filled 2017-10-11 (×4): qty 1

## 2017-10-11 MED ORDER — MORPHINE SULFATE 10 MG/ML IJ SOLN
INTRAMUSCULAR | Status: DC | PRN
  Administered 2017-10-11: 10 mg

## 2017-10-11 MED ORDER — FAMOTIDINE 20 MG PO TABS
20.0000 mg | ORAL_TABLET | Freq: Once | ORAL | Status: AC
  Administered 2017-10-11: 20 mg via ORAL

## 2017-10-11 MED ORDER — PHENYLEPHRINE HCL 10 MG/ML IJ SOLN
INTRAMUSCULAR | Status: DC | PRN
  Administered 2017-10-11 (×2): 100 ug via INTRAVENOUS

## 2017-10-11 MED ORDER — MAGNESIUM CITRATE PO SOLN
1.0000 | Freq: Once | ORAL | Status: AC | PRN
  Administered 2017-10-12: 1 via ORAL
  Filled 2017-10-11 (×2): qty 296

## 2017-10-11 MED ORDER — ONDANSETRON HCL 4 MG/2ML IJ SOLN: 4.0000 mg | Freq: Four times a day (QID) | INTRAMUSCULAR | Status: DC | PRN

## 2017-10-11 MED ORDER — PROPOFOL 500 MG/50ML IV EMUL
INTRAVENOUS | Status: DC | PRN
  Administered 2017-10-11: 100 ug/kg/min via INTRAVENOUS

## 2017-10-11 MED ORDER — HYDROCODONE-ACETAMINOPHEN 5-325 MG PO TABS
1.0000 | ORAL_TABLET | ORAL | Status: DC | PRN
  Administered 2017-10-12: 1 via ORAL
  Administered 2017-10-12: 2 via ORAL
  Filled 2017-10-11: qty 2
  Filled 2017-10-11: qty 1

## 2017-10-11 MED ORDER — HYDROCODONE-ACETAMINOPHEN 7.5-325 MG PO TABS: 1.0000 | ORAL_TABLET | ORAL | Status: DC | PRN

## 2017-10-11 MED ORDER — FENTANYL CITRATE (PF) 100 MCG/2ML IJ SOLN: 25.0000 ug | INTRAMUSCULAR | Status: DC | PRN

## 2017-10-11 MED ORDER — FENTANYL CITRATE (PF) 100 MCG/2ML IJ SOLN
INTRAMUSCULAR | Status: DC | PRN
  Administered 2017-10-11: 50 ug via INTRAVENOUS

## 2017-10-11 MED ORDER — BIOTIN 10 MG PO CAPS: 1.0000 | ORAL_CAPSULE | Freq: Every day | ORAL | Status: DC

## 2017-10-11 MED ORDER — BUPIVACAINE-EPINEPHRINE (PF) 0.25% -1:200000 IJ SOLN
INTRAMUSCULAR | Status: DC | PRN
  Administered 2017-10-11: 30 mL

## 2017-10-11 MED ORDER — EPHEDRINE SULFATE 50 MG/ML IJ SOLN
INTRAMUSCULAR | Status: AC
  Filled 2017-10-11: qty 1

## 2017-10-11 MED ORDER — OMEGA-3-ACID ETHYL ESTERS 1 G PO CAPS
1.0000 g | ORAL_CAPSULE | Freq: Every day | ORAL | Status: DC
  Administered 2017-10-11 – 2017-10-13 (×3): 1 g via ORAL
  Filled 2017-10-11 (×3): qty 1

## 2017-10-11 MED ORDER — METHOCARBAMOL 500 MG PO TABS: 500.0000 mg | ORAL_TABLET | Freq: Four times a day (QID) | ORAL | Status: DC | PRN

## 2017-10-11 MED ORDER — CLOBETASOL PROPIONATE 0.05 % EX OINT
1.0000 "application " | TOPICAL_OINTMENT | CUTANEOUS | Status: DC
  Filled 2017-10-11: qty 15

## 2017-10-11 MED ORDER — MENTHOL 3 MG MT LOZG
1.0000 | LOZENGE | OROMUCOSAL | Status: DC | PRN
  Filled 2017-10-11: qty 9

## 2017-10-11 MED ORDER — DIPHENHYDRAMINE HCL 12.5 MG/5ML PO ELIX: 12.5000 mg | ORAL_SOLUTION | ORAL | Status: DC | PRN

## 2017-10-11 MED ORDER — ACETAMINOPHEN 10 MG/ML IV SOLN
INTRAVENOUS | Status: AC
  Filled 2017-10-11: qty 100

## 2017-10-11 MED ORDER — MIDAZOLAM HCL 2 MG/2ML IJ SOLN
INTRAMUSCULAR | Status: AC
  Filled 2017-10-11: qty 2

## 2017-10-11 MED ORDER — DEXTROSE 5 % IV SOLN
500.0000 mg | Freq: Four times a day (QID) | INTRAVENOUS | Status: DC | PRN
  Filled 2017-10-11: qty 5

## 2017-10-11 MED ORDER — SODIUM CHLORIDE 0.9 % IV SOLN
INTRAVENOUS | Status: DC | PRN
  Administered 2017-10-11: 60 mL

## 2017-10-11 MED ORDER — FENTANYL CITRATE (PF) 100 MCG/2ML IJ SOLN
INTRAMUSCULAR | Status: AC
  Filled 2017-10-11: qty 2

## 2017-10-11 MED ORDER — TRAMADOL HCL 50 MG PO TABS
50.0000 mg | ORAL_TABLET | Freq: Four times a day (QID) | ORAL | Status: DC
  Administered 2017-10-11 – 2017-10-13 (×8): 50 mg via ORAL
  Filled 2017-10-11 (×8): qty 1

## 2017-10-11 MED ORDER — SENNOSIDES-DOCUSATE SODIUM 8.6-50 MG PO TABS: 1.0000 | ORAL_TABLET | Freq: Every evening | ORAL | Status: DC | PRN

## 2017-10-11 MED ORDER — MORPHINE SULFATE (PF) 2 MG/ML IV SOLN: 0.5000 mg | INTRAVENOUS | Status: DC | PRN

## 2017-10-11 MED ORDER — NEOMYCIN-POLYMYXIN B GU 40-200000 IR SOLN
Status: DC | PRN
  Administered 2017-10-11: 12 mL
  Administered 2017-10-11: 4 mL

## 2017-10-11 MED ORDER — MIDAZOLAM HCL 5 MG/5ML IJ SOLN
INTRAMUSCULAR | Status: DC | PRN
  Administered 2017-10-11: 2 mg via INTRAVENOUS

## 2017-10-11 MED ORDER — ACETAMINOPHEN 10 MG/ML IV SOLN
INTRAVENOUS | Status: DC | PRN
  Administered 2017-10-11: 1000 mg via INTRAVENOUS

## 2017-10-11 MED ORDER — ONDANSETRON HCL 4 MG/2ML IJ SOLN
INTRAMUSCULAR | Status: AC
  Filled 2017-10-11: qty 2

## 2017-10-11 MED ORDER — FAMOTIDINE 20 MG PO TABS
ORAL_TABLET | ORAL | Status: AC
  Filled 2017-10-11: qty 1

## 2017-10-11 MED ORDER — CEFAZOLIN SODIUM-DEXTROSE 2-4 GM/100ML-% IV SOLN
2.0000 g | Freq: Four times a day (QID) | INTRAVENOUS | Status: AC
  Administered 2017-10-11 – 2017-10-12 (×3): 2 g via INTRAVENOUS
  Filled 2017-10-11 (×3): qty 100

## 2017-10-11 MED ORDER — BISACODYL 5 MG PO TBEC: 5.0000 mg | DELAYED_RELEASE_TABLET | Freq: Every day | ORAL | Status: DC | PRN

## 2017-10-11 MED ORDER — SODIUM CHLORIDE 0.9 % IV SOLN
INTRAVENOUS | Status: DC
  Administered 2017-10-11: 15:00:00 via INTRAVENOUS
  Administered 2017-10-12: 75 mL/h via INTRAVENOUS

## 2017-10-11 MED ORDER — ONDANSETRON HCL 4 MG/2ML IJ SOLN: 4.0000 mg | Freq: Once | INTRAMUSCULAR | Status: DC | PRN

## 2017-10-11 MED ORDER — DEXAMETHASONE SODIUM PHOSPHATE 10 MG/ML IJ SOLN
INTRAMUSCULAR | Status: DC | PRN
  Administered 2017-10-11: 8 mg via INTRAVENOUS

## 2017-10-11 MED ORDER — ZOLPIDEM TARTRATE 5 MG PO TABS: 5.0000 mg | ORAL_TABLET | Freq: Every evening | ORAL | Status: DC | PRN

## 2017-10-11 SURGICAL SUPPLY — 65 items
BANDAGE ACE 6X5 VEL STRL LF (GAUZE/BANDAGES/DRESSINGS) ×2 IMPLANT
BLADE SAW 1 (BLADE) ×2 IMPLANT
BLADE SAW SGTL 13X75X1.27 (BLADE) ×2 IMPLANT
BLOCK CUTTING FEMUR 3 RT MED (MISCELLANEOUS) ×2 IMPLANT
BLOCK CUTTING TIBIAL 2 RT (MISCELLANEOUS) ×2 IMPLANT
BLOCK CUTTING TIBIAL 4 RT MIS (MISCELLANEOUS) ×2 IMPLANT
CANISTER SUCT 1200ML W/VALVE (MISCELLANEOUS) ×2 IMPLANT
CANISTER SUCT 3000ML PPV (MISCELLANEOUS) ×4 IMPLANT
CEMENT FEMORAL COMP SZ3 RT (Femur) ×2 IMPLANT
CEMENT HV SMART SET (Cement) ×4 IMPLANT
CHLORAPREP W/TINT 26ML (MISCELLANEOUS) ×4 IMPLANT
COOLER POLAR GLACIER W/PUMP (MISCELLANEOUS) ×2 IMPLANT
CUFF TOURN 24 STER (MISCELLANEOUS) ×2 IMPLANT
CUFF TOURN 30 STER DUAL PORT (MISCELLANEOUS) IMPLANT
DRAPE SHEET LG 3/4 BI-LAMINATE (DRAPES) ×4 IMPLANT
ELECT CAUTERY BLADE 6.4 (BLADE) ×2 IMPLANT
ELECT REM PT RETURN 9FT ADLT (ELECTROSURGICAL) ×2
ELECTRODE REM PT RTRN 9FT ADLT (ELECTROSURGICAL) ×1 IMPLANT
FEMUR BONE MODEL (MISCELLANEOUS) ×2 IMPLANT
GAUZE PETRO XEROFOAM 1X8 (MISCELLANEOUS) ×2 IMPLANT
GAUZE SPONGE 4X4 12PLY STRL (GAUZE/BANDAGES/DRESSINGS) ×2 IMPLANT
GLOVE BIOGEL PI IND STRL 9 (GLOVE) ×1 IMPLANT
GLOVE BIOGEL PI INDICATOR 9 (GLOVE) ×1
GLOVE INDICATOR 8.0 STRL GRN (GLOVE) ×2 IMPLANT
GLOVE SURG ORTHO 8.0 STRL STRW (GLOVE) ×2 IMPLANT
GLOVE SURG SYN 9.0  PF PI (GLOVE) ×1
GLOVE SURG SYN 9.0 PF PI (GLOVE) ×1 IMPLANT
GOWN SRG 2XL LVL 4 RGLN SLV (GOWNS) ×1 IMPLANT
GOWN STRL NON-REIN 2XL LVL4 (GOWNS) ×1
GOWN STRL REUS W/ TWL LRG LVL3 (GOWN DISPOSABLE) ×1 IMPLANT
GOWN STRL REUS W/ TWL XL LVL3 (GOWN DISPOSABLE) ×1 IMPLANT
GOWN STRL REUS W/TWL LRG LVL3 (GOWN DISPOSABLE) ×1
GOWN STRL REUS W/TWL XL LVL3 (GOWN DISPOSABLE) ×1
HOLDER FOLEY CATH W/STRAP (MISCELLANEOUS) ×2 IMPLANT
HOOD PEEL AWAY FLYTE STAYCOOL (MISCELLANEOUS) ×4 IMPLANT
INSERT TIBIAL RT SZ2 (Insert) ×2 IMPLANT
KIT TURNOVER KIT A (KITS) ×2 IMPLANT
KNIFE SCULPS 14X20 (INSTRUMENTS) ×2 IMPLANT
NDL SAFETY ECLIPSE 18X1.5 (NEEDLE) ×1 IMPLANT
NEEDLE HYPO 18GX1.5 SHARP (NEEDLE) ×1
NEEDLE SPNL 18GX3.5 QUINCKE PK (NEEDLE) ×2 IMPLANT
NEEDLE SPNL 20GX3.5 QUINCKE YW (NEEDLE) ×2 IMPLANT
NS IRRIG 1000ML POUR BTL (IV SOLUTION) ×2 IMPLANT
PACK TOTAL KNEE (MISCELLANEOUS) ×2 IMPLANT
PAD WRAPON POLAR KNEE (MISCELLANEOUS) ×1 IMPLANT
PATELLA RESURFACING MEDACTA 02 (Bone Implant) ×2 IMPLANT
PULSAVAC PLUS IRRIG FAN TIP (DISPOSABLE) ×2
SCALPEL PROTECTED #10 DISP (BLADE) ×4 IMPLANT
SOL .9 NS 3000ML IRR  AL (IV SOLUTION) ×1
SOL .9 NS 3000ML IRR UROMATIC (IV SOLUTION) ×1 IMPLANT
STAPLER SKIN PROX 35W (STAPLE) ×2 IMPLANT
STEM EXTENSION 11MMX30MM (Stem) ×2 IMPLANT
SUCTION FRAZIER HANDLE 10FR (MISCELLANEOUS) ×1
SUCTION TUBE FRAZIER 10FR DISP (MISCELLANEOUS) ×1 IMPLANT
SUT DVC 2 QUILL PDO  T11 36X36 (SUTURE) ×1
SUT DVC 2 QUILL PDO T11 36X36 (SUTURE) ×1 IMPLANT
SUT V-LOC 90 ABS DVC 3-0 CL (SUTURE) ×2 IMPLANT
SYR 20CC LL (SYRINGE) ×2 IMPLANT
SYR 50ML LL SCALE MARK (SYRINGE) ×4 IMPLANT
TIB TRAY SZ 2 R FIXED (Joint) ×2 IMPLANT
TIP FAN IRRIG PULSAVAC PLUS (DISPOSABLE) ×1 IMPLANT
TOWEL OR 17X26 4PK STRL BLUE (TOWEL DISPOSABLE) ×2 IMPLANT
TOWER CARTRIDGE SMART MIX (DISPOSABLE) ×2 IMPLANT
TRAY FOLEY MTR SLVR 16FR STAT (SET/KITS/TRAYS/PACK) ×2 IMPLANT
WRAPON POLAR PAD KNEE (MISCELLANEOUS) ×2

## 2017-10-11 NOTE — H&P (Signed)
Reviewed paper H+P, will be scanned into chart. No changes noted.  

## 2017-10-11 NOTE — Anesthesia Preprocedure Evaluation (Addendum)
Anesthesia Evaluation  Patient identified by MRN, date of birth, ID band Patient awake    Reviewed: Allergy & Precautions, NPO status , Patient's Chart, lab work & pertinent test results  History of Anesthesia Complications Negative for: history of anesthetic complications  Airway Mallampati: II       Dental   Pulmonary neg sleep apnea, neg COPD,           Cardiovascular (-) hypertension(-) Past MI and (-) CHF (-) dysrhythmias (-) Valvular Problems/Murmurs     Neuro/Psych    GI/Hepatic Neg liver ROS, neg GERD  ,  Endo/Other  neg diabetes  Renal/GU negative Renal ROS     Musculoskeletal   Abdominal   Peds  Hematology   Anesthesia Other Findings   Reproductive/Obstetrics                            Anesthesia Physical Anesthesia Plan  ASA: II  Anesthesia Plan: Spinal   Post-op Pain Management:    Induction:   PONV Risk Score and Plan:   Airway Management Planned:   Additional Equipment:   Intra-op Plan:   Post-operative Plan:   Informed Consent: I have reviewed the patients History and Physical, chart, labs and discussed the procedure including the risks, benefits and alternatives for the proposed anesthesia with the patient or authorized representative who has indicated his/her understanding and acceptance.     Plan Discussed with:   Anesthesia Plan Comments:         Anesthesia Quick Evaluation

## 2017-10-11 NOTE — Anesthesia Post-op Follow-up Note (Signed)
Anesthesia QCDR form completed.        

## 2017-10-11 NOTE — Anesthesia Procedure Notes (Signed)
Spinal  Start time: 10/11/2017 9:46 AM Staffing Anesthesiologist: Gunnar Fusi, MD Resident/CRNA: Aline Brochure, CRNA Performed: resident/CRNA  Preanesthetic Checklist Completed: patient identified, site marked, surgical consent, pre-op evaluation, IV checked, risks and benefits discussed and monitors and equipment checked Spinal Block Patient position: sitting Prep: ChloraPrep Patient monitoring: heart rate, continuous pulse ox and blood pressure Approach: midline Location: L3-4 Injection technique: single-shot Needle Needle type: Pencan  Needle gauge: 24 G

## 2017-10-11 NOTE — Clinical Social Work Note (Signed)
CSW received referral for SNF.  PT is recommending home health, plan is to discharge home with home health.  CSW to sign off please re-consult if social work needs arise.  Jones Broom. [place], MSW, [place]

## 2017-10-11 NOTE — Evaluation (Signed)
Physical Therapy Evaluation Patient Details Name: Jessica Sullivan MRN: [id number] DOB: 24-Aug-1945 Today's Date: 10/11/2017   History of Present Illness  Pt presents to hospital on 10/11/17 for an elective total knee replacement performed by Dr. Rudene Christians. Pt has no pertinent medical history    Clinical Impression  Pt is a pleasant 72 year old female who was admitted for a R TKR performed by Dr. Rudene Christians. Pt performs bed mobility, transfers, and ambulation with Mod I to CGA and a RW. Pt demonstrates deficits with strength, ROM, and mobility. Pt demonstrates ability to perform 10 SLRs with independence, therefore does not require KI for mobility. Pt sensation WNL on B LE. Pt  able to perform AROM of all LE motions including extension of toes. Pt states that pain was 1/10 at the onset of therapy and remained controlled throughout. Pt was transferred with CGA assistance sit>stand at Rw. Pt amb 3' using walker transferred stand>sit in chair at bedside. Pt AAROM of R knee 0-85 degrees. Pt was instructed verbally and through demonstration HEP. Pt tolerated treatment well. PT would benefit from continued skilled therapy to improve toward PLOF. PT will continue to work with pt BID during admission. At this time PT recommends d/c home with home health PT.     Follow Up Recommendations Home health PT    Equipment Recommendations  None recommended by PT(pt has all needed home equipment)    Recommendations for Other Services       Precautions / Restrictions Precautions Precautions: Knee Restrictions Weight Bearing Restrictions: Yes RLE Weight Bearing: Weight bearing as tolerated      Mobility  Bed Mobility Overal bed mobility: Modified Independent             General bed mobility comments: Pt able to perform with modified independence requiring no physical assistance however needs increased UE support and time  Transfers Overall transfer level: Needs assistance Equipment used: Rolling walker (2  wheeled) Transfers: Sit to/from Stand Sit to Stand: Min guard         General transfer comment: Pt transfers sit<>stand with CGA to RW. Pt able to transfer without gross LOB and appropriate use of UEs and RW  Ambulation/Gait Ambulation/Gait assistance: Min guard Gait Distance (Feet): 3 Feet Assistive device: Rolling walker (2 wheeled) Gait Pattern/deviations: Shuffle     General Gait Details: Pt amb 3' from bed to chair beside bed with RW and CGA. Pt demonstrates shuffling pattern and no gross LOB.  Stairs            Wheelchair Mobility    Modified Rankin (Stroke Patients Only)       Balance Overall balance assessment: Needs assistance   Sitting balance-Leahy Scale: Good Sitting balance - Comments: Able to sit EOB without UE support and no LOB     Standing balance-Leahy Scale: Fair Standing balance comment: Pt requires B UE support on RW in standing however does not require physical assistance and no LOB                             Pertinent Vitals/Pain Pain Assessment: 0-10 Pain Score: 1  Pain Location: R knee Pain Descriptors / Indicators: Sharp;Operative site guarding Pain Intervention(s): Limited activity within patient's tolerance;Monitored during session;Ice applied;Repositioned    Home Living Family/patient expects to be discharged to:: Private residence Living Arrangements: Spouse/significant other Available Help at Discharge: Family Type of Home: House Home Access: Stairs to enter Entrance Stairs-Rails: Can reach both  Entrance Stairs-Number of Steps: 3-4 Home Layout: One level Home Equipment: Walker - 2 wheels;Cane - single point      Prior Function Level of Independence: Independent         Comments: Pt independent prior to surgery without AD. Pt amb 3+ miles per day.     Hand Dominance        Extremity/Trunk Assessment   Upper Extremity Assessment Upper Extremity Assessment: Overall WFL for tasks assessed(Grossly at  least 3+/5 shld flex, elbow flex/ext, grip)    Lower Extremity Assessment Lower Extremity Assessment: RLE deficits/detail;LLE deficits/detail RLE Deficits / Details: Unable to fully assess due to pain. Pt able to perform 10 SLR independently. able to perform all motor movements of LE RLE Sensation: WNL LLE Deficits / Details: Grossly at least 4+/5 including hip flex, knee flex/ext, DF LLE Sensation: WNL       Communication   Communication: No difficulties  Cognition Arousal/Alertness: Awake/alert Behavior During Therapy: WFL for tasks assessed/performed Overall Cognitive Status: Within Functional Limits for tasks assessed                                 General Comments: A & O x4      General Comments      Exercises Total Joint Exercises Goniometric ROM: aarom of R knee 0-85 degrees Other Exercises Other Exercises: Pt instructed in and tolerates well R LE ther-ex including ankle pumps, quad sets, glut sets, SLR, hip abd, seated and standing marching as well as heel slides x10 reps each   Assessment/Plan    PT Assessment Patient needs continued PT services  PT Problem List Decreased strength;Decreased range of motion;Decreased activity tolerance;Decreased balance;Decreased mobility;Decreased coordination;Decreased knowledge of use of DME;Decreased safety awareness;Pain       PT Treatment Interventions DME instruction;Functional mobility training;Stair training;Gait training;Therapeutic activities;Therapeutic exercise;Balance training;Neuromuscular re-education;Patient/family education    PT Goals (Current goals can be found in the Care Plan section)  Acute Rehab PT Goals Patient Stated Goal: pt states that she wants to be out of pain and return to PLOF PT Goal Formulation: With patient Time For Goal Achievement: 10/25/17 Potential to Achieve Goals: Good    Frequency BID   Barriers to discharge        Co-evaluation               AM-PAC PT "6  Clicks" Daily Activity  Outcome Measure Difficulty turning over in bed (including adjusting bedclothes, sheets and blankets)?: A Little Difficulty moving from lying on back to sitting on the side of the bed? : A Little Difficulty sitting down on and standing up from a chair with arms (e.g., wheelchair, bedside commode, etc,.)?: Unable Help needed moving to and from a bed to chair (including a wheelchair)?: A Little Help needed walking in hospital room?: A Little Help needed climbing 3-5 steps with a railing? : A Lot 6 Click Score: 15    End of Session Equipment Utilized During Treatment: Gait belt Activity Tolerance: Patient tolerated treatment well Patient left: in chair;with call bell/phone within reach;with chair alarm set;with SCD's reapplied   PT Visit Diagnosis: Unsteadiness on feet (R26.81);Other abnormalities of gait and mobility (R26.89);Muscle weakness (generalized) (M62.81);Difficulty in walking, not elsewhere classified (R26.2);Pain Pain - Right/Left: Right Pain - part of body: Knee    Time: 0263-7858 PT Time Calculation (min) (ACUTE ONLY): 40 min   Charges:  Yolonda Kida, SPT   Diarra Ceja 10/11/2017, 4:49 PM

## 2017-10-11 NOTE — Op Note (Signed)
10/11/2017  11:24 AM  PATIENT:  Jessica Sullivan  72 y.o. female  PRE-OPERATIVE DIAGNOSIS:  PRIMARY OSTEOARTHRITIS OF RIGHT KNEE  POST-OPERATIVE DIAGNOSIS:  PRIMARY OSTEOARTHRITIS OF RIGHT KNEE  PROCEDURE:  Procedure(s): TOTAL KNEE ARTHROPLASTY (Right)  SURGEON: Laurene Footman, MD  ASSISTANTS: Rachelle Hora, PA-C  ANESTHESIA:   spinal  EBL:  Total I/O In: 700 [I.V.:700] Out: 125 [Urine:100; Blood:25]  BLOOD ADMINISTERED:none  DRAINS: none   LOCAL MEDICATIONS USED:  MARCAINE    and OTHER Exparel and morphine  SPECIMEN:  No Specimen  DISPOSITION OF SPECIMEN:  N/A  COUNTS:  YES  TOURNIQUET: 51 minutes at 300 mmHg  IMPLANTS: Medacta GMK Sphere 3 right, 2 left tibia with 10 mm insert and short stem, size 2 patella, all components cemented  DICTATION: .Dragon Dictation   patient was brought to the operating room and after adequate anesthesia was obtainedrightlegwas prepped and draped in thesterile fashion with tourniquet applied to therightupper thigh. After patient identification and timeout procedure was completed thetourniquet was raised and midline skin incision was made followed by medial parapatellar arthrotomy. Inspection revealed significant patellofemoral degenerative change of medial compartment, there was exposed bone and eburnation of the medial compartment both femoral and tibial condyleswith moderate mild lateral compartmentosteoarthritis. There is also synovitis present within the joint which was excised. The ACL and fat pad were excised as well. The proximal tibia was exposed and themy knee cutting guide was applied proximal tibia cut carried out.Distal femoral cutting guide applied in a similar fashion and distal femoral cut carried outThe femur was then prepared withsize3cutting guide withanterior posterior and chamfer cuts made with appropriate size cuts and this appeared to fit well the femoraltrial was placed distal drill holes made  followed by the trochlear groove cut. The tibia was prepared using the tibial template baseplate size 2this was pinned into position proximal tibial preparation carried out for short stem. A 58mm insert gave excellent stability and full extension. The trials were removed at this point and the bony surfaces thoroughly irrigated and dried after the patellar cutting been carried out with the patellar cutting guide drill holes made and sized to a size 2the above local was then infiltrated in the periarticular tissues and the bony surfaces dried the tibial component was cemented to place first with excess cement removed followed by the polyethylene component with the set screws and a torque screwdriver. The femoral component was placed in the knee held in extension as the cement set with the patellar button clamped into place. After the cement hadset,excess cement was removed and the knee was thoroughly again thoroughly irrigated with tourniquet let down. A lateral release was performed because of some tendency for the patella to want to track laterally. The arthrotomy was then repaired using heavy Brion Aliment,.3-0 v-loccuticular followed by skin staples.    PLAN OF CARE: Admit to inpatient   PATIENT DISPOSITION:  PACU - hemodynamically stable.

## 2017-10-11 NOTE — Transfer of Care (Signed)
Immediate Anesthesia Transfer of Care Note  Patient: Jessica Sullivan  Procedure(s) Performed: TOTAL KNEE ARTHROPLASTY (Right Knee)  Patient Location: PACU  Anesthesia Type:Spinal  Level of Consciousness: awake, alert  and oriented  Airway & Oxygen Therapy: Patient connected to nasal cannula oxygen  Post-op Assessment: Post -op Vital signs reviewed and stable  Post vital signs: stable  Last Vitals:  Vitals Value Taken Time  BP 95/47 10/11/2017 11:27 AM  Temp    Pulse 86 10/11/2017 11:27 AM  Resp 10 10/11/2017 11:27 AM  SpO2 96 % 10/11/2017 11:27 AM    Last Pain:  Vitals:   10/11/17 0759  TempSrc: Oral  PainSc: 0-No pain         Complications: No apparent anesthesia complications

## 2017-10-12 ENCOUNTER — Encounter: Payer: Self-pay | Admitting: Orthopedic Surgery

## 2017-10-12 LAB — BASIC METABOLIC PANEL
ANION GAP: 5 (ref 5–15)
BUN: 16 mg/dL (ref 8–23)
CHLORIDE: 107 mmol/L (ref 98–111)
CO2: 27 mmol/L (ref 22–32)
CREATININE: 0.71 mg/dL (ref 0.44–1.00)
Calcium: 9.1 mg/dL (ref 8.9–10.3)
GFR calc non Af Amer: >60 mL/min (ref >60)
Glucose, Bld: 134 mg/dL — ABNORMAL HIGH (ref 70–99)
Potassium: 4.1 mmol/L (ref 3.5–5.1)
SODIUM: 139 mmol/L (ref 135–145)

## 2017-10-12 LAB — CBC
HEMATOCRIT: 35.6 % (ref 35.0–47.0)
HEMOGLOBIN: 12.5 g/dL (ref 12.0–16.0)
MCH: 30.7 pg (ref 26.0–34.0)
MCHC: 35.2 g/dL (ref 32.0–36.0)
MCV: 87.3 fL (ref 80.0–100.0)
Platelets: 234 10*3/uL (ref 150–440)
RBC: 4.08 MIL/uL (ref 3.80–5.20)
RDW: 13.4 % (ref 11.5–14.5)
WBC: 12.5 10*3/uL — ABNORMAL HIGH (ref 3.6–11.0)

## 2017-10-12 MED ORDER — ASPIRIN 81 MG PO CHEW: 81.0000 mg | CHEWABLE_TABLET | Freq: Two times a day (BID) | ORAL | 0 refills | Status: AC

## 2017-10-12 MED ORDER — HYDROCODONE-ACETAMINOPHEN 5-325 MG PO TABS: 1.0000 | ORAL_TABLET | ORAL | 0 refills | Status: DC | PRN

## 2017-10-12 NOTE — Discharge Summary (Signed)
Physician Discharge Summary  Patient ID: Jessica Sullivan MRN: [id number] DOB/AGE: 09-28-1945 72 y.o.  Admit date: 10/11/2017 Discharge date: 10/13/2017 Admission Diagnoses:  PRIMARY OSTEOARTHRITIS OF RIGHT KNEE   Discharge Diagnoses: Patient Active Problem List   Diagnosis Date Noted  . Status post total knee replacement using cement, right 10/11/2017  . Osteoarthritis of both knees 03/16/2017  . Lichen sclerosus 54/62/7035  . Menopause 01/08/2015  . Advance directive discussed with patient 12/17/2014  . Routine general medical examination at a health care facility 12/04/2013  . Varicose veins of left lower extremity with pain   . Hyperlipidemia     Past Medical History:  Diagnosis Date  . Chronic venous insufficiency   . Hyperlipidemia      Transfusion: none   Consultants (if any):   Discharged Condition: Improved  Hospital Course: Jessica Sullivan is an 72 y.o. female who was admitted 10/11/2017 with a diagnosis of <principal problem not specified> and went to the operating room on 10/11/2017 and underwent the above named procedures.    Surgeries: Procedure(s): TOTAL KNEE ARTHROPLASTY on 10/11/2017 Patient tolerated the surgery well. Taken to PACU where she was stabilized and then transferred to the orthopedic floor.  Started on aspirin 81 mg BID with SCDs and TED hose. Heels elevated on bed with rolled towels. No evidence of DVT. Negative Homan. Physical therapy started on day #1 for gait training and transfer. OT started day #1 for ADL and assisted devices.  Patient's foley was d/c on day #1. Patient's IV was d/c on day #2.  On post op day #2 patient was stable and ready for discharge to home with HHPT.  Implants: Medacta GMK Sphere 3 right, 2 left tibia with 10 mm insert and short stem, size 2 patella, all components cemented    She was given perioperative antibiotics:  Anti-infectives (From admission, onward)   Start     Dose/Rate Route Frequency Ordered Stop   10/11/17 1600  ceFAZolin (ANCEF) IVPB 2g/100 mL premix     2 g 200 mL/hr over 30 Minutes Intravenous Every 6 hours 10/11/17 1330 10/12/17 0529   10/11/17 0807  ceFAZolin (ANCEF) 2-4 GM/100ML-% IVPB    Note to Pharmacy:  Josephina Shih   : cabinet override      10/11/17 0807 10/11/17 0955   10/10/17 2230  ceFAZolin (ANCEF) IVPB 2g/100 mL premix     2 g 200 mL/hr over 30 Minutes Intravenous  Once 10/10/17 2217 10/11/17 1005    .  She was given sequential compression devices, early ambulation, and Aspirin and TEDs for DVT prophylaxis.  She benefited maximally from the hospital stay and there were no complications.    Recent vital signs:  Vitals:   10/12/17 0426 10/12/17 0826  BP: 135/74 140/73  Pulse: 89 80  Resp: 13 19  Temp: 97.9 F (36.6 C) 97.7 F (36.5 C)  SpO2: 94% 96%    Recent laboratory studies:  Lab Results  Component Value Date   HGB 12.5 10/12/2017   HGB 13.6 10/11/2017   HGB 14.4 09/28/2017   Lab Results  Component Value Date   WBC 12.5 (H) 10/12/2017   PLT 234 10/12/2017   Lab Results  Component Value Date   INR 1.03 09/28/2017   Lab Results  Component Value Date   NA 139 10/12/2017   K 4.1 10/12/2017   CL 107 10/12/2017   CO2 27 10/12/2017   BUN 16 10/12/2017   CREATININE 0.71 10/12/2017   GLUCOSE 134 (H) 10/12/2017  Discharge Medications:   Allergies as of 10/12/2017      Reactions   Oxycodone Other (See Comments)   Light headed.   Peach Flavor [flavoring Agent]    Only peach snapps   Poison Ivy Extract Itching, Rash      Medication List    STOP taking these medications   aspirin 81 MG tablet Replaced by:  aspirin 81 MG chewable tablet     TAKE these medications   aspirin 81 MG chewable tablet Chew 1 tablet (81 mg total) by mouth 2 (two) times daily. Replaces:  aspirin 81 MG tablet   Biotin 10 MG Caps Take 1 tablet by mouth daily.   CALCIUM + D PO Take 1 tablet by mouth 2 (two) times daily.   ICAPS AREDS 2 Caps Take 1  capsule by mouth 2 (two) times daily.   CENTRUM SILVER ADULT 50+ Tabs Take 1 tablet by mouth daily.   clobetasol ointment 0.05 % Commonly known as:  TEMOVATE Twice a week What changed:    how much to take  how to take this  when to take this  additional instructions   cyanocobalamin 100 MCG tablet Take 100 mcg by mouth daily.   Fish Oil 1000 MG Caps Take 1 capsule by mouth daily.   GLUCOSAMINE PO Take 1 tablet by mouth 2 (two) times daily.   HYDROcodone-acetaminophen 5-325 MG tablet Commonly known as:  NORCO/VICODIN Take 1-2 tablets by mouth every 4 (four) hours as needed for moderate pain (pain score 4-6).            Durable Medical Equipment  (From admission, onward)        Start     Ordered   10/11/17 1331  DME Walker rolling  Once    Question:  Patient needs a walker to treat with the following condition  Answer:  Status post total knee replacement using cement, right   10/11/17 1330   10/11/17 1331  DME 3 n 1  Once     10/11/17 1330   10/11/17 1331  DME Bedside commode  Once    Question:  Patient needs a bedside commode to treat with the following condition  Answer:  Status post total knee replacement using cement, right   10/11/17 1330      Diagnostic Studies: Dg Knee 1-2 Views Right  Result Date: 10/11/2017 CLINICAL DATA:  Status post right knee joint prosthesis placement. EXAM: RIGHT KNEE - 1-2 VIEW COMPARISON:  CT scan of the right knee of September 12, 2017 FINDINGS: The patient has undergone total right knee joint prosthesis placement. Radiographic positioning of the prosthetic components is good. The interface with the native bone appears normal. There is no acute native bone abnormality. A small amount of fluid and air is present in the anterior soft tissues. IMPRESSION: There is no acute postprocedure complication following right total knee joint prosthesis placement. Electronically Signed   By: David  Martinique M.D.   On: 10/11/2017 11:59    Disposition:      Follow-up Information    Duanne Guess, PA-C Follow up in 2 week(s).   Specialties:  Orthopedic Surgery, Emergency Medicine Contact information: [place] [address] [phone]            Signed: Feliberto Gottron 10/12/2017, 4:05 PM

## 2017-10-12 NOTE — Discharge Instructions (Signed)

## 2017-10-12 NOTE — Progress Notes (Signed)
Clinical Social Worker (CSW) received SNF consult. PT is recommending home health. RN case manager aware of above. Please reconsult if future social work needs arise. CSW signing off.   Imagene Boss, LCSW [phone] 

## 2017-10-12 NOTE — Evaluation (Signed)
Occupational Therapy Evaluation Patient Details Name: Jessica Sullivan MRN: [id number] DOB: [birth year] Today's Date: 10/12/2017    History of Present Illness Pt presents to hospital on 10/11/17 for an elective total knee replacement performed by Dr. Rudene Christians. Pt has no pertinent medical history   Clinical Impression   Pt seen for OT evaluation this date, POD#1 from above surgery. Pt was independent in all ADLs prior to surgery and is eager to return to PLOF with less pain and improved safety and independence. Pt lives with her spouse and 2 dogs in a 1 story home with 3-4 steps to enter and bilat rails. Pt currently requires PRN minimal assist for LB dressing and bathing while in seated position due to mild pain and limited AROM of R knee. Pt instructed in polar care mgt, falls prevention strategies including pet care considerations, home/routines modifications, DME/AE for LB bathing and dressing tasks, and compression stocking mgt. Pt verbalized understanding of all education/training provided this date via verbal instruction and visual demonstration. No additional skilled OT needs; will sign off. Do not anticipate any skilled OT needs upon discharge.     Follow Up Recommendations  No OT follow up    Equipment Recommendations  Other (comment)(consider reacher, sock aid)    Recommendations for Other Services       Precautions / Restrictions Precautions Precautions: Knee Precaution Booklet Issued: No Restrictions Weight Bearing Restrictions: Yes RLE Weight Bearing: Weight bearing as tolerated      Mobility Bed Mobility             General bed mobility comments: deferred, up in recliner  Transfers Overall transfer level: Needs assistance Equipment used: Rolling walker (2 wheeled) Transfers: Sit to/from Stand Sit to Stand: Min guard         General transfer comment: Pt transfers sit<>stand with CGA to RW. Pt able to transfer without gross LOB and appropriate use of UEs and  RW    Balance Overall balance assessment: Needs assistance Sitting-balance support: Feet supported;No upper extremity supported       Standing balance support: Bilateral upper extremity supported Standing balance-Leahy Scale: Fair                             ADL either performed or assessed with clinical judgement   ADL Overall ADL's : Needs assistance/impaired                                       General ADL Comments: PRN min assist for LB bathing and dressing 2/2 decreased ROM/strength and pain in R knee. Pt instructed in AE for LB ADL and compression stocking mgt. Pt verbalized understanding to verbal instruction and visual demo.     Vision Baseline Vision/History: Wears glasses Wears Glasses: At all times Patient Visual Report: No change from baseline       Perception     Praxis      Pertinent Vitals/Pain Pain Assessment: 0-10 Pain Score: 1  Pain Location: R knee Pain Descriptors / Indicators: Aching;Discomfort Pain Intervention(s): Limited activity within patient's tolerance;Monitored during session;Repositioned;Ice applied     Hand Dominance     Extremity/Trunk Assessment Upper Extremity Assessment Upper Extremity Assessment: Overall WFL for tasks assessed   Lower Extremity Assessment Lower Extremity Assessment: Defer to PT evaluation   Cervical / Trunk Assessment Cervical / Trunk Assessment: Normal   Communication  Communication Communication: No difficulties   Cognition Arousal/Alertness: Awake/alert Behavior During Therapy: WFL for tasks assessed/performed Overall Cognitive Status: Within Functional Limits for tasks assessed                                 General Comments: A & O x4   General Comments       Exercises Other Exercises: Pt instructed in polar care mgt Other Exercises: Pt instructed in falls prevention strategies including proper footwear and pet care considerations Other Exercises: Pt  instructed in functional mobility training with RW to perform standing ADL and IADL tasks at counter height   Shoulder Instructions      Home Living Family/patient expects to be discharged to:: Private residence Living Arrangements: Spouse/significant other;Other (Comment)(2 dogs) Available Help at Discharge: Family Type of Home: House Home Access: Stairs to enter [employer] of Steps: 3-4 Entrance Stairs-Rails: Can reach both Home Layout: One level     Bathroom Shower/Tub: Tub/shower unit;Walk-in shower   Bathroom Toilet: Standard     Home Equipment: Environmental consultant - 2 wheels;Cane - single point;Grab bars - toilet          Prior Functioning/Environment Level of Independence: Independent        Comments: Pt independent prior to surgery without AD. Pt amb 3+ miles per day. No falls.         OT Problem List: Decreased strength;Decreased range of motion;Pain      OT Treatment/Interventions:      OT Goals(Current goals can be found in the care plan section) Acute Rehab OT Goals Patient Stated Goal: go home and get back to PLOF OT Goal Formulation: All assessment and education complete, DC therapy  OT Frequency:     Barriers to D/C:            Co-evaluation              AM-PAC PT "6 Clicks" Daily Activity     Outcome Measure Help from another person eating meals?: None Help from another person taking care of personal grooming?: None Help from another person toileting, which includes using toliet, bedpan, or urinal?: None Help from another person bathing (including washing, rinsing, drying)?: A Little Help from another person to put on and taking off regular upper body clothing?: None Help from another person to put on and taking off regular lower body clothing?: A Little 6 Click Score: 22   End of Session    Activity Tolerance: Patient tolerated treatment well Patient left: in chair;with call bell/phone within reach;with chair alarm set;Other  (comment)(polar care in place, PT in room for session)  OT Visit Diagnosis: Other abnormalities of gait and mobility (R26.89)                Time: 1914-7829 OT Time Calculation (min): 15 min Charges:  OT General Charges $OT Visit: 1 Visit OT Evaluation $OT Eval Low Complexity: 1 Low OT Treatments $Self Care/Home Management : 8-22 mins  Jeni Salles, MPH, MS, OTR/L ascom [phone] 10/12/17, 1:47 PM

## 2017-10-12 NOTE — Progress Notes (Addendum)
Physical Therapy Treatment Patient Details Name: Jessica Sullivan MRN: [id number] DOB: 01-09-1946 Today's Date: 10/12/2017    History of Present Illness Pt presents to hospital on 10/11/17 for an elective total knee replacement performed by Dr. Rudene Christians. Pt has no pertinent medical history    PT Comments    Pt seen this morning and states that she is doing well she states that she has had minimal pain other than when using the bone foam. Pt reports that pain is 1/10 at onset of therapy and increases to 4/10 with there-ex and amb. Pt instructed in R LE there-ex and tolerates well. Pts AAROM of R knee 0-89 degrees. Pt amb 200' total with CGA and RW demonstrating improving gait pattern with verbal cues. Pt could benefit from continued skilled therapy at this time to improve deficits toward PLOF. PT will continue to work with pt BID while admitted. D/c recommendations continue to be HHPT.    Follow Up Recommendations  Home health PT     Equipment Recommendations  None recommended by PT    Recommendations for Other Services       Precautions / Restrictions Precautions Precautions: Knee Precaution Booklet Issued: No Restrictions Weight Bearing Restrictions: Yes RLE Weight Bearing: Weight bearing as tolerated    Mobility  Bed Mobility Overal bed mobility: Modified Independent             General bed mobility comments: Pt able to perform with modified independence requiring no physical assistance however needs increased UE support and time  Transfers Overall transfer level: Needs assistance Equipment used: Rolling walker (2 wheeled) Transfers: Sit to/from Stand Sit to Stand: Min guard         General transfer comment: Pt transfers sit<>stand with CGA to RW. Pt able to transfer without gross LOB and appropriate use of UEs and RW  Ambulation/Gait Ambulation/Gait assistance: Min guard Gait Distance (Feet): 200 Feet Assistive device: Rolling walker (2 wheeled) Gait  Pattern/deviations: Step-through pattern     General Gait Details: pt amb 200' total with CGA and RW. Pt requires verbal cuing for RW use and appropriate amounts of UE support however is able to demonstrate appropriate step through pattern without noticable limp   Stairs             Wheelchair Mobility    Modified Rankin (Stroke Patients Only)       Balance                                            Cognition Arousal/Alertness: Awake/alert Behavior During Therapy: WFL for tasks assessed/performed Overall Cognitive Status: Within Functional Limits for tasks assessed                                 General Comments: A & O x4      Exercises Total Joint Exercises Goniometric ROM: AAROM of R knee 0-89 degrees Other Exercises Other Exercises: Pt instructed in and tolerates well R LE ther-ex including ankle pumps, quad sets, glut sets, SLR, hip abd, seated and standing marching as well as heel slides x12 reps each    General Comments        Pertinent Vitals/Pain Pain Assessment: 0-10 Pain Score: 1 (1/10 at rest elevates to 4/10 with amb and ther-ex) Pain Location: R knee Pain Descriptors / Indicators: Sharp;Operative  site guarding Pain Intervention(s): Limited activity within patient's tolerance;Monitored during session;Premedicated before session;Ice applied;Repositioned    Home Living                      Prior Function            PT Goals (current goals can now be found in the care plan section) Acute Rehab PT Goals Patient Stated Goal: pt states that she wants to be out of pain and return to PLOF PT Goal Formulation: With patient Time For Goal Achievement: 10/25/17 Potential to Achieve Goals: Good Progress towards PT goals: Progressing toward goals    Frequency    BID      PT Plan Current plan remains appropriate    Co-evaluation              AM-PAC PT "6 Clicks" Daily Activity  Outcome  Measure  Difficulty turning over in bed (including adjusting bedclothes, sheets and blankets)?: A Little Difficulty moving from lying on back to sitting on the side of the bed? : A Little Difficulty sitting down on and standing up from a chair with arms (e.g., wheelchair, bedside commode, etc,.)?: Unable Help needed moving to and from a bed to chair (including a wheelchair)?: A Little Help needed walking in hospital room?: A Little Help needed climbing 3-5 steps with a railing? : A Little 6 Click Score: 16    End of Session Equipment Utilized During Treatment: Gait belt Activity Tolerance: Patient tolerated treatment well Patient left: in chair;with call bell/phone within reach;with chair alarm set;with SCD's reapplied Nurse Communication: Mobility status PT Visit Diagnosis: Unsteadiness on feet (R26.81);Other abnormalities of gait and mobility (R26.89);Muscle weakness (generalized) (M62.81);Difficulty in walking, not elsewhere classified (R26.2);Pain Pain - Right/Left: Right Pain - part of body: Knee     Time: 1610-9604 PT Time Calculation (min) (ACUTE ONLY): 26 min  Charges:                        Yolonda Kida, SPT    Salayah Meares 10/12/2017, 10:12 AM

## 2017-10-12 NOTE — Plan of Care (Signed)
  Problem: Education: Goal: Knowledge of General Education information will improve Description Including pain rating scale, medication(s)/side effects and non-pharmacologic comfort measures Outcome: Progressing   

## 2017-10-12 NOTE — Progress Notes (Signed)
   Subjective: 1 Day Post-Op Procedure(s) (LRB): TOTAL KNEE ARTHROPLASTY (Right) Patient reports pain as mild.  Has not taken norco. Only pain is with blue foam Patient is well, and has had no acute complaints or problems Denies any CP, SOB, ABD pain. We will start physical therapy today.  Plan is to go Home after hospital stay.  Objective: Vital signs in last 24 hours: Temp:  [97.4 F (36.3 C)-98.2 F (36.8 C)] 97.9 F (36.6 C) (08/07 0426) Pulse Rate:  [62-91] 89 (08/07 0426) Resp:  [10-19] 13 (08/07 0426) BP: (95-143)/(47-84) 135/74 (08/07 0426) SpO2:  [94 %-100 %] 94 % (08/07 0426) Weight:  [63.5 kg (140 lb)] 63.5 kg (140 lb) (08/06 0759)  Intake/Output from previous day: 08/06 0701 - 08/07 0700 In: 2825 [P.O.:600; I.V.:1925; IV Piggyback:300] Out: 1875 [Urine:1850; Blood:25] Intake/Output this shift: No intake/output data recorded.  Recent Labs    10/11/17 0824 10/12/17 0446  HGB 13.6 12.5   Recent Labs    10/11/17 0824 10/12/17 0446  WBC  --  12.5*  RBC  --  4.08  HCT 40.0 35.6  PLT  --  234   Recent Labs    10/11/17 0824 10/12/17 0446  NA 139 139  K 3.8 4.1  CL  --  107  CO2  --  27  BUN  --  16  CREATININE  --  0.71  GLUCOSE 105* 134*  CALCIUM  --  9.1   No results for input(s): LABPT, INR in the last 72 hours.  EXAM General - Patient is Alert, Appropriate and Oriented Extremity - Neurovascular intact Sensation intact distally Intact pulses distally Dorsiflexion/Plantar flexion intact No cellulitis present Compartment soft Dressing - dressing C/D/I and no drainage Motor Function - intact, moving foot and toes well on exam.   Past Medical History:  Diagnosis Date  . Chronic venous insufficiency   . Hyperlipidemia     Assessment/Plan:   1 Day Post-Op Procedure(s) (LRB): TOTAL KNEE ARTHROPLASTY (Right) Active Problems:   Status post total knee replacement using cement, right  Estimated body mass index is 26.02 kg/m as calculated  from the following:   Height as of this encounter: 5' 1.5" (1.562 m).   Weight as of this encounter: 63.5 kg (140 lb). Advance diet Up with therapy  Needs BM Pain well controlled VSS Labs WNL CM to assist with discharge to home with HHPT  DVT Prophylaxis - Aspirin, TED hose and SCDs Weight-Bearing as tolerated to right leg   T. Rachelle Hora, PA-C [place] 10/12/2017, 7:47 AM

## 2017-10-12 NOTE — Anesthesia Postprocedure Evaluation (Signed)
Anesthesia Post Note  Patient: KATHLEENE BERGEMANN  Procedure(s) Performed: TOTAL KNEE ARTHROPLASTY (Right Knee)  Patient location during evaluation: Other Anesthesia Type: Spinal Level of consciousness: oriented and awake and alert Pain management: pain level controlled Vital Signs Assessment: post-procedure vital signs reviewed and stable Respiratory status: spontaneous breathing, respiratory function stable and patient connected to nasal cannula oxygen Cardiovascular status: blood pressure returned to baseline and stable Postop Assessment: no headache, no backache and no apparent nausea or vomiting Anesthetic complications: no     Last Vitals:  Vitals:   10/12/17 0024 10/12/17 0426  BP: 127/73 135/74  Pulse: 82 89  Resp: 14 13  Temp: 36.5 C 36.6 C  SpO2: 94% 94%    Last Pain:  Vitals:   10/12/17 0426  TempSrc: Oral  PainSc:     LLE Motor Response: Purposeful movement (10/12/17 0730) LLE Sensation: Full sensation (10/12/17 0730) RLE Motor Response: Purposeful movement (10/12/17 0730) RLE Sensation: Full sensation (10/12/17 0730)      Alison Stalling

## 2017-10-12 NOTE — NC FL2 (Signed)
[place] LEVEL OF CARE SCREENING TOOL     IDENTIFICATION  Patient Name: Jessica Sullivan Birthdate: 11-16-1945 Sex: female Admission Date (Current Location): 10/11/2017  [place] and [place] Number:  Engineering geologist and Address:  [facility], [address] [place], [place] [address]      Provider Number: [id number]  Attending Physician Name and Address:  Hessie Knows, MD  Relative Name and Phone Number:       Current Level of Care: Hospital Recommended Level of Care: [place] Prior Approval Number:    Date Approved/Denied:   PASRR Number: [id number])  Discharge Plan: SNF    Current Diagnoses: Patient Active Problem List   Diagnosis Date Noted  . Status post total knee replacement using cement, right 10/11/2017  . Osteoarthritis of both knees 03/16/2017  . Lichen sclerosus 28/76/8115  . Menopause 01/08/2015  . Advance directive discussed with patient 12/17/2014  . Routine general medical examination at a health care facility 12/04/2013  . Varicose veins of left lower extremity with pain   . Hyperlipidemia     Orientation RESPIRATION BLADDER Height & Weight     Self, Time, Place, Situation  Normal Continent Weight: 63.5 kg (140 lb) Height:  5' 1.5" (156.2 cm)  BEHAVIORAL SYMPTOMS/MOOD NEUROLOGICAL BOWEL NUTRITION STATUS      Continent Diet(Diet: Regular. )  AMBULATORY STATUS COMMUNICATION OF NEEDS Skin   Extensive Assist Verbally Surgical wounds(Incision: right knee. )                       Personal Care Assistance Level of Assistance  Bathing, Feeding, Dressing Bathing Assistance: Limited assistance Feeding assistance: Independent Dressing Assistance: Limited assistance     Functional Limitations Info  Sight, Hearing, Speech Sight Info: Adequate Hearing Info: Adequate Speech Info: Adequate    SPECIAL CARE FACTORS FREQUENCY  PT (By licensed PT), OT (By licensed OT)     PT Frequency:  (5) OT Frequency: (5)            Contractures      Additional Factors Info  Code Status, Allergies Code Status Info: (Full Code. ) Allergies Info: (Oxycodone, Peach Flavor Flavoring Agent, Poison Ivy Extract)           Current Medications (10/12/2017):  This is the current hospital active medication list Current Facility-Administered Medications  Medication Dose Route Frequency Provider Last Rate Last Dose  . 0.9 %  sodium chloride infusion   Intravenous Continuous Hessie Knows, MD 75 mL/hr at 10/12/17 0500 75 mL/hr at 10/12/17 0500  . acetaminophen (TYLENOL) tablet 325-650 mg  325-650 mg Oral Q6H PRN Hessie Knows, MD      . acetaminophen (TYLENOL) tablet 500 mg  500 mg Oral Q6H Hessie Knows, MD   500 mg at 10/12/17 0459  . alum & mag hydroxide-simeth (MAALOX/MYLANTA) 200-200-20 MG/5ML suspension 30 mL  30 mL Oral Q4H PRN Hessie Knows, MD      . aspirin chewable tablet 81 mg  81 mg Oral BID Hessie Knows, MD   81 mg at 10/12/17 7262  . Biotin CAPS 1 tablet  1 tablet Oral Daily Hessie Knows, MD      . bisacodyl (DULCOLAX) EC tablet 5 mg  5 mg Oral Daily PRN Hessie Knows, MD      . calcium-vitamin D (OSCAL WITH D) 500-200 MG-UNIT per tablet 1 tablet  1 tablet Oral BID Hessie Knows, MD   1 tablet at 10/12/17 [phone]  . [  START ON 10/13/2017] clobetasol ointment (TEMOVATE) 1.94 % 1 application  1 application Topical Once per day on Mon Thu Menz, Legrand Como, MD      . diphenhydrAMINE (BENADRYL) 12.5 MG/5ML elixir 12.5-25 mg  12.5-25 mg Oral Q4H PRN Hessie Knows, MD      . docusate sodium (COLACE) capsule 100 mg  100 mg Oral BID Hessie Knows, MD   100 mg at 10/12/17 0843  . HYDROcodone-acetaminophen (NORCO) 7.5-325 MG per tablet 1-2 tablet  1-2 tablet Oral Q4H PRN Hessie Knows, MD      . HYDROcodone-acetaminophen (NORCO/VICODIN) 5-325 MG per tablet 1-2 tablet  1-2 tablet Oral Q4H PRN Hessie Knows, MD   1 tablet at 10/12/17 [phone]  . magnesium citrate solution 1 Bottle  1 Bottle Oral Once  PRN Hessie Knows, MD      . menthol-cetylpyridinium (CEPACOL) lozenge 3 mg  1 lozenge Oral PRN Hessie Knows, MD       Or  . phenol (CHLORASEPTIC) mouth spray 1 spray  1 spray Mouth/Throat PRN Hessie Knows, MD      . methocarbamol (ROBAXIN) tablet 500 mg  500 mg Oral Q6H PRN Hessie Knows, MD       Or  . methocarbamol (ROBAXIN) 500 mg in dextrose 5 % 50 mL IVPB  500 mg Intravenous Q6H PRN Hessie Knows, MD      . metoCLOPramide (REGLAN) tablet 5-10 mg  5-10 mg Oral Q8H PRN Hessie Knows, MD       Or  . metoCLOPramide (REGLAN) injection 5-10 mg  5-10 mg Intravenous Q8H PRN Hessie Knows, MD      . morphine 2 MG/ML injection 0.5-1 mg  0.5-1 mg Intravenous Q2H PRN Hessie Knows, MD      . multivitamin with minerals tablet 1 tablet  1 tablet Oral Daily Hessie Knows, MD   1 tablet at 10/12/17 [phone]  . multivitamin-lutein (OCUVITE-LUTEIN) capsule 1 capsule  1 capsule Oral BID Hessie Knows, MD   1 capsule at 10/12/17 0843  . omega-3 acid ethyl esters (LOVAZA) capsule 1 g  1 g Oral Daily Hessie Knows, MD   1 g at 10/12/17 0842  . ondansetron (ZOFRAN) tablet 4 mg  4 mg Oral Q6H PRN Hessie Knows, MD       Or  . ondansetron [facility]) injection 4 mg  4 mg Intravenous Q6H PRN Hessie Knows, MD      . senna-docusate (Senokot-S) tablet 1 tablet  1 tablet Oral QHS PRN Hessie Knows, MD      . traMADol Veatrice Bourbon) tablet 50 mg  50 mg Oral Q6H Hessie Knows, MD   50 mg at 10/12/17 0459  . vitamin B-12 (CYANOCOBALAMIN) tablet 100 mcg  100 mcg Oral Daily Hessie Knows, MD   100 mcg at 10/12/17 0843  . zolpidem (AMBIEN) tablet 5 mg  5 mg Oral QHS PRN Hessie Knows, MD         Discharge Medications: Please see discharge summary for a list of discharge medications.  Relevant Imaging Results:  Relevant Lab Results:   Additional Information (SSN: [id number])  Marrian Bells, Veronia Beets, LCSW

## 2017-10-12 NOTE — Progress Notes (Signed)
Physical Therapy Treatment Patient Details Name: Jessica Sullivan MRN: [id number] DOB: [birth year] Today's Date: 10/12/2017    History of Present Illness Pt presents to hospital on 10/11/17 for an elective total knee replacement performed by Dr. Rudene Christians. Pt has no pertinent medical history    PT Comments    Pt seen this afternoon and states that she is doing well. Pt states that her pain remains minimal and that she has tolerated being up in the chair since previous session. Pt instructed in and tolerates there-ex well. Pt amb 225'. After extensive verbal cuing and demonstration pt ascends and descends 4 stairs with CGA and no LOB, demonstrating safety entering and exiting home environment. Pt could benefit from continued skilled therapy at this time to improve deficits toward PLOF. PT will continue to work with pt BID while admitted. D/c recommendations continue to be home with HHPT.    Follow Up Recommendations  Home health PT     Equipment Recommendations  None recommended by PT    Recommendations for Other Services       Precautions / Restrictions Precautions Precautions: Knee Precaution Booklet Issued: Yes (comment) Precaution Comments: Given and reviewed ther-ex packet Restrictions Weight Bearing Restrictions: Yes RLE Weight Bearing: Weight bearing as tolerated    Mobility  Bed Mobility               General bed mobility comments: Not assessed this visit. Pt up in recliner and chooses to stay up upon completion.  Transfers Overall transfer level: Needs assistance Equipment used: Rolling walker (2 wheeled) Transfers: Sit to/from Stand Sit to Stand: Min guard         General transfer comment: Pt transfers sit<>stand with CGA to RW. Pt able to transfer without gross LOB and appropriate use of UEs and RW  Ambulation/Gait Ambulation/Gait assistance: Min guard Gait Distance (Feet): 225 Feet Assistive device: Rolling walker (2 wheeled) Gait Pattern/deviations:  Step-through pattern     General Gait Details: pt amb 225' total with CGA and RW. Pt requires verbal cuing for RW use and appropriate amounts of UE support however is able to demonstrate appropriate step through pattern without noticable limp   Stairs Stairs: Yes Stairs assistance: Min guard Stair Management: Two rails Number of Stairs: 4 General stair comments: After extensive verbal cuing and demonstration pt ascends and descends 4 stairs with B rails and CGA. Pt performs safely without LOB demonstrating safety entering and exiting home enviornment.   Wheelchair Mobility    Modified Rankin (Stroke Patients Only)       Balance Overall balance assessment: Needs assistance Sitting-balance support: Feet supported;No upper extremity supported       Standing balance support: Bilateral upper extremity supported Standing balance-Leahy Scale: Fair                              Cognition Arousal/Alertness: Awake/alert Behavior During Therapy: WFL for tasks assessed/performed Overall Cognitive Status: Within Functional Limits for tasks assessed                                 General Comments: A & O x4      Exercises Other Exercises Other Exercises: Pt instructed in and tolerates well R LE ther-ex including ankle pumps, quad sets, glut sets, SLR as well as heel slides x15 reps each Other Exercises: Pt instructed in polar care mgt Other Exercises: Pt  instructed in falls prevention strategies including proper footwear and pet care considerations Other Exercises: Pt instructed in functional mobility training with RW to perform standing ADL and IADL tasks at counter height    General Comments        Pertinent Vitals/Pain Pain Assessment: 0-10 Pain Score: 0-No pain(0/10 at rest increases to 3/10 with activity) Pain Location: R knee Pain Descriptors / Indicators: Aching;Discomfort Pain Intervention(s): Limited activity within patient's  tolerance;Monitored during session;Ice applied    Home Living Family/patient expects to be discharged to:: Private residence Living Arrangements: Spouse/significant other;Other (Comment)(2 dogs) Available Help at Discharge: Family Type of Home: House Home Access: Stairs to enter Entrance Stairs-Rails: Can reach both Home Layout: One level Home Equipment: Environmental consultant - 2 wheels;Cane - single point;Grab bars - toilet      Prior Function Level of Independence: Independent      Comments: Pt independent prior to surgery without AD. Pt amb 3+ miles per day. No falls.    PT Goals (current goals can now be found in the care plan section) Acute Rehab PT Goals Patient Stated Goal: go home and get back to PLOF PT Goal Formulation: With patient Time For Goal Achievement: 10/25/17 Potential to Achieve Goals: Good Progress towards PT goals: Progressing toward goals    Frequency    BID      PT Plan Current plan remains appropriate    Co-evaluation              AM-PAC PT "6 Clicks" Daily Activity  Outcome Measure  Difficulty turning over in bed (including adjusting bedclothes, sheets and blankets)?: A Little Difficulty moving from lying on back to sitting on the side of the bed? : A Little Difficulty sitting down on and standing up from a chair with arms (e.g., wheelchair, bedside commode, etc,.)?: Unable Help needed moving to and from a bed to chair (including a wheelchair)?: A Little Help needed walking in hospital room?: A Little Help needed climbing 3-5 steps with a railing? : A Little 6 Click Score: 16    End of Session Equipment Utilized During Treatment: Gait belt Activity Tolerance: Patient tolerated treatment well Patient left: in chair;with call bell/phone within reach;with chair alarm set;with SCD's reapplied   PT Visit Diagnosis: Unsteadiness on feet (R26.81);Other abnormalities of gait and mobility (R26.89);Muscle weakness (generalized) (M62.81);Difficulty in  walking, not elsewhere classified (R26.2);Pain Pain - Right/Left: Right Pain - part of body: Knee     Time: 1115-5208 PT Time Calculation (min) (ACUTE ONLY): 26 min  Charges:  $Gait Training: 8-22 mins $Therapeutic Exercise: 8-22 mins                     [employer], SPT   Erol Flanagin 10/12/2017, 3:38 PM

## 2017-10-12 NOTE — Care Management (Signed)
Attempted to assess patient. PT working with patient. Will reassess at a later time.

## 2017-10-13 NOTE — Progress Notes (Signed)
   Subjective: 2 Days Post-Op Procedure(s) (LRB): TOTAL KNEE ARTHROPLASTY (Right) Patient reports pain as mild.  Patient is well, and has had no acute complaints or problems Denies any CP, SOB, ABD pain. We will start physical therapy today.  Was able to ambulate stairs yesterday. Plan is to go Home after hospital stay.  Objective: Vital signs in last 24 hours: Temp:  [97.7 F (36.5 C)-98.9 F (37.2 C)] 98.9 F (37.2 C) (08/07 2331) Pulse Rate:  [70-87] 87 (08/07 2331) Resp:  [16-19] 16 (08/07 2331) BP: (121-145)/(68-74) 121/74 (08/07 2331) SpO2:  [92 %-96 %] 92 % (08/07 2331)  Intake/Output from previous day: 08/07 0701 - 08/08 0700 In: 1047.5 [P.O.:360; I.V.:687.5] Out: -  Intake/Output this shift: No intake/output data recorded.  Recent Labs    10/11/17 0824 10/12/17 0446  HGB 13.6 12.5   Recent Labs    10/11/17 0824 10/12/17 0446  WBC  --  12.5*  RBC  --  4.08  HCT 40.0 35.6  PLT  --  234   Recent Labs    10/11/17 0824 10/12/17 0446  NA 139 139  K 3.8 4.1  CL  --  107  CO2  --  27  BUN  --  16  CREATININE  --  0.71  GLUCOSE 105* 134*  CALCIUM  --  9.1   No results for input(s): LABPT, INR in the last 72 hours.  EXAM General - Patient is Alert, Appropriate and Oriented Extremity - Neurovascular intact Sensation intact distally Intact pulses distally Dorsiflexion/Plantar flexion intact No cellulitis present Compartment soft Dressing - dressing C/D/I and no drainage.  Bulky dressing removed today and honeycomb dressing applied to the right knee. Motor Function - intact, moving foot and toes well on exam.   Past Medical History:  Diagnosis Date  . Chronic venous insufficiency   . Hyperlipidemia     Assessment/Plan:   2 Days Post-Op Procedure(s) (LRB): TOTAL KNEE ARTHROPLASTY (Right) Active Problems:   Status post total knee replacement using cement, right  Estimated body mass index is 26.02 kg/m as calculated from the following:  Height as of this encounter: 5' 1.5" (1.562 m).   Weight as of this encounter: 63.5 kg. Advance diet Up with therapy   Labs reviewed this AM. Pain well controlled. Pt has had a BM and cleared stairs with PT yesterday. Plan will be for discharge today with HHPT.  DVT Prophylaxis - Aspirin, TED hose and SCDs Weight-Bearing as tolerated to right leg  J. Cameron Proud, PA-C [place] 10/13/2017, 6:53 AM

## 2017-10-13 NOTE — Progress Notes (Signed)
PHARMACIST - PHYSICIAN ORDER COMMUNICATION  CONCERNING: P&T Medication Policy on Herbal Medications  DESCRIPTION:  This patient's order for:  Biotin  has been noted.  This product(s) is classified as an "herbal" or natural product. Due to a lack of definitive safety studies or FDA approval, nonstandard manufacturing practices, plus the potential risk of unknown drug-drug interactions while on inpatient medications, the Pharmacy and Therapeutics Committee does not permit the use of "herbal" or natural products of this type within [place].   ACTION TAKEN: The pharmacy department is unable to verify this order at this time Please reevaluate patient's clinical condition at discharge and address if the herbal or natural product(s) should be resumed at that time.    

## 2017-10-13 NOTE — Progress Notes (Signed)
Discharge instructions given to pt. IV removed. Pt sitting up in chair and states her husband will be here to pick her up around lunch or a little after. No questions or complaints at this time. Will assist pt with getting dressed and await discharge.

## 2017-10-13 NOTE — Progress Notes (Signed)
Physical Therapy Treatment Patient Details Name: Jessica Sullivan MRN: [id number] DOB: [birth year] Today's Date: 10/13/2017    History of Present Illness Pt presents to hospital on 10/11/17 for an elective total knee replacement performed by Dr. Rudene Christians. Pt has no pertinent medical history    PT Comments    Pt seen this morning and states that she is doing well and is very eager to go home. Pt states that her pain is 1/10 at onset of therapy and remains controlled throughout. Pt instructed in and tolerates there-ex well. PT reviewed there-ex packet with pt. Pts AAROM of R knee 0-94 degrees. Pt amb 200' total with close supervision and continuous verbal cuing and demonstration to normalize gait pattern. Pt tolerated session well and demonstrates ability to safely d/c home at this time. Pt could benefit from continued skilled therapy at this time to improve deficits toward PLOF. PT will continue to work with pt BID while admitted. D/c recommendations continue to be home with HHPT.    Follow Up Recommendations  Home health PT     Equipment Recommendations  None recommended by PT    Recommendations for Other Services       Precautions / Restrictions Precautions Precautions: Knee Precaution Booklet Issued: Yes (comment) Precaution Comments: Given and reviewed ther-ex packet Restrictions Weight Bearing Restrictions: Yes RLE Weight Bearing: Weight bearing as tolerated    Mobility  Bed Mobility Overal bed mobility: Modified Independent             General bed mobility comments: Pt requires increased effort and time to perform however does not require physical assistance  Transfers Overall transfer level: Modified independent Equipment used: Rolling walker (2 wheeled) Transfers: Sit to/from Stand Sit to Stand: Modified independent (Device/Increase time)         General transfer comment: Pt transfers sit<>stand mod I to RW with appropriate use of  RW.  Ambulation/Gait Ambulation/Gait assistance: Supervision Gait Distance (Feet): 200 Feet Assistive device: Rolling walker (2 wheeled) Gait Pattern/deviations: Step-through pattern     General Gait Details: Pt amb 200' total with RW and close supervision. PT provides continuous verbal cuing and demonstration to normalize gait pattern including appropriate stride length, UE support on RW and positioning of RW.   Stairs             Wheelchair Mobility    Modified Rankin (Stroke Patients Only)       Balance                                            Cognition Arousal/Alertness: Awake/alert Behavior During Therapy: WFL for tasks assessed/performed Overall Cognitive Status: Within Functional Limits for tasks assessed                                 General Comments: A & O x4      Exercises Total Joint Exercises Goniometric ROM: AAROM of R knee 0-94 degrees Other Exercises Other Exercises: Pt instructed in and tolerates well R LE ther-ex x15 reps each including ankle pumps, quad sets, glut sets, SLR, LAQ, heel slides. Reviewed ther-ex packet with pt.    General Comments        Pertinent Vitals/Pain Pain Assessment: 0-10 Pain Score: 1  Pain Location: R knee Pain Descriptors / Indicators: Aching;Discomfort Pain Intervention(s): Limited activity within patient's  tolerance;Monitored during session;Ice applied    Home Living                      Prior Function            PT Goals (current goals can now be found in the care plan section) Acute Rehab PT Goals Patient Stated Goal: go home and get back to PLOF PT Goal Formulation: With patient Time For Goal Achievement: 10/25/17 Potential to Achieve Goals: Good Progress towards PT goals: Progressing toward goals    Frequency    BID      PT Plan Current plan remains appropriate    Co-evaluation              AM-PAC PT "6 Clicks" Daily Activity   Outcome Measure  Difficulty turning over in bed (including adjusting bedclothes, sheets and blankets)?: A Little Difficulty moving from lying on back to sitting on the side of the bed? : A Little Difficulty sitting down on and standing up from a chair with arms (e.g., wheelchair, bedside commode, etc,.)?: A Little Help needed moving to and from a bed to chair (including a wheelchair)?: A Little Help needed walking in hospital room?: None Help needed climbing 3-5 steps with a railing? : A Little 6 Click Score: 19    End of Session Equipment Utilized During Treatment: Gait belt Activity Tolerance: Patient tolerated treatment well Patient left: in chair;with call bell/phone within reach;with chair alarm set Nurse Communication: Mobility status;Other (comment)(d/c readiness) PT Visit Diagnosis: Unsteadiness on feet (R26.81);Other abnormalities of gait and mobility (R26.89);Muscle weakness (generalized) (M62.81);Difficulty in walking, not elsewhere classified (R26.2);Pain Pain - Right/Left: Right Pain - part of body: Knee     Time: 7169-6789 PT Time Calculation (min) (ACUTE ONLY): 31 min  Charges:                        Yolonda Kida, SPT    Ashey Tramontana 10/13/2017, 10:02 AM

## 2017-10-13 NOTE — Care Management Note (Signed)
Case Management Note  Patient Details  Name: Jessica Sullivan MRN: [id number] Date of Birth: [birth year]  Subjective/Objective:  Met with patient at bedside to discuss discharge planning. Patient lives at home with her spouse who will assist her at discharge. She will need a walker. Ordered from [place] with [facility]. Offered a list of home health agencies. Referral to [facility] for home health PT. She will discharge home on [facility] today.                   Action/Plan: [facility] for HHPT. [facility] to deliver walker prior to discharge.   Expected Discharge Date:  10/13/17               Expected Discharge Plan:  [place]  In-House Referral:     Discharge planning Services  CM Consult  Post Acute Care Choice:  Durable Medical Equipment, Home Health Choice offered to:  Patient  DME Arranged:  Walker rolling DME Agency:  [place]:  PT [place] Agency:  [facility] (formerly [facility])  Status of Service:  Completed, signed off  If discussed at [employer] of Stay Meetings, dates discussed:    Additional Comments:  Jolly Mango, RN 10/13/2017, 9:28 AM

## 2017-10-15 DIAGNOSIS — Z96651 Presence of right artificial knee joint: Secondary | ICD-10-CM | POA: Diagnosis not present

## 2017-10-15 DIAGNOSIS — M1712 Unilateral primary osteoarthritis, left knee: Secondary | ICD-10-CM | POA: Diagnosis not present

## 2017-10-15 DIAGNOSIS — L900 Lichen sclerosus et atrophicus: Secondary | ICD-10-CM | POA: Diagnosis not present

## 2017-10-15 DIAGNOSIS — E785 Hyperlipidemia, unspecified: Secondary | ICD-10-CM | POA: Diagnosis not present

## 2017-10-15 DIAGNOSIS — Z471 Aftercare following joint replacement surgery: Secondary | ICD-10-CM | POA: Diagnosis not present

## 2017-10-15 DIAGNOSIS — I83812 Varicose veins of left lower extremities with pain: Secondary | ICD-10-CM | POA: Diagnosis not present

## 2017-10-17 DIAGNOSIS — I83812 Varicose veins of left lower extremities with pain: Secondary | ICD-10-CM | POA: Diagnosis not present

## 2017-10-17 DIAGNOSIS — E785 Hyperlipidemia, unspecified: Secondary | ICD-10-CM | POA: Diagnosis not present

## 2017-10-17 DIAGNOSIS — M1712 Unilateral primary osteoarthritis, left knee: Secondary | ICD-10-CM | POA: Diagnosis not present

## 2017-10-17 DIAGNOSIS — Z471 Aftercare following joint replacement surgery: Secondary | ICD-10-CM | POA: Diagnosis not present

## 2017-10-17 DIAGNOSIS — Z96651 Presence of right artificial knee joint: Secondary | ICD-10-CM | POA: Diagnosis not present

## 2017-10-17 DIAGNOSIS — L900 Lichen sclerosus et atrophicus: Secondary | ICD-10-CM | POA: Diagnosis not present

## 2017-10-20 DIAGNOSIS — E785 Hyperlipidemia, unspecified: Secondary | ICD-10-CM | POA: Diagnosis not present

## 2017-10-20 DIAGNOSIS — Z471 Aftercare following joint replacement surgery: Secondary | ICD-10-CM | POA: Diagnosis not present

## 2017-10-20 DIAGNOSIS — I83812 Varicose veins of left lower extremities with pain: Secondary | ICD-10-CM | POA: Diagnosis not present

## 2017-10-20 DIAGNOSIS — Z96651 Presence of right artificial knee joint: Secondary | ICD-10-CM | POA: Diagnosis not present

## 2017-10-20 DIAGNOSIS — M1712 Unilateral primary osteoarthritis, left knee: Secondary | ICD-10-CM | POA: Diagnosis not present

## 2017-10-20 DIAGNOSIS — L900 Lichen sclerosus et atrophicus: Secondary | ICD-10-CM | POA: Diagnosis not present

## 2017-10-21 DIAGNOSIS — I83812 Varicose veins of left lower extremities with pain: Secondary | ICD-10-CM | POA: Diagnosis not present

## 2017-10-21 DIAGNOSIS — M1712 Unilateral primary osteoarthritis, left knee: Secondary | ICD-10-CM | POA: Diagnosis not present

## 2017-10-21 DIAGNOSIS — L900 Lichen sclerosus et atrophicus: Secondary | ICD-10-CM | POA: Diagnosis not present

## 2017-10-21 DIAGNOSIS — Z96651 Presence of right artificial knee joint: Secondary | ICD-10-CM | POA: Diagnosis not present

## 2017-10-21 DIAGNOSIS — E785 Hyperlipidemia, unspecified: Secondary | ICD-10-CM | POA: Diagnosis not present

## 2017-10-21 DIAGNOSIS — Z471 Aftercare following joint replacement surgery: Secondary | ICD-10-CM | POA: Diagnosis not present

## 2017-10-24 DIAGNOSIS — M1712 Unilateral primary osteoarthritis, left knee: Secondary | ICD-10-CM | POA: Diagnosis not present

## 2017-10-24 DIAGNOSIS — Z471 Aftercare following joint replacement surgery: Secondary | ICD-10-CM | POA: Diagnosis not present

## 2017-10-24 DIAGNOSIS — I83812 Varicose veins of left lower extremities with pain: Secondary | ICD-10-CM | POA: Diagnosis not present

## 2017-10-24 DIAGNOSIS — E785 Hyperlipidemia, unspecified: Secondary | ICD-10-CM | POA: Diagnosis not present

## 2017-10-24 DIAGNOSIS — L900 Lichen sclerosus et atrophicus: Secondary | ICD-10-CM | POA: Diagnosis not present

## 2017-10-24 DIAGNOSIS — Z96651 Presence of right artificial knee joint: Secondary | ICD-10-CM | POA: Diagnosis not present

## 2017-10-26 DIAGNOSIS — Z96651 Presence of right artificial knee joint: Secondary | ICD-10-CM | POA: Diagnosis not present

## 2017-11-15 DIAGNOSIS — Z471 Aftercare following joint replacement surgery: Secondary | ICD-10-CM | POA: Diagnosis not present

## 2017-11-23 DIAGNOSIS — Z96651 Presence of right artificial knee joint: Secondary | ICD-10-CM | POA: Diagnosis not present

## 2017-12-06 DIAGNOSIS — Z23 Encounter for immunization: Secondary | ICD-10-CM | POA: Diagnosis not present

## 2018-01-26 ENCOUNTER — Encounter: Payer: Self-pay | Admitting: Obstetrics and Gynecology

## 2018-01-26 ENCOUNTER — Ambulatory Visit (INDEPENDENT_AMBULATORY_CARE_PROVIDER_SITE_OTHER): Payer: Medicare Other | Admitting: Obstetrics and Gynecology

## 2018-01-26 VITALS — BP 144/82 | Ht 62.0 in | Wt 136.1 lb

## 2018-01-26 DIAGNOSIS — N952 Postmenopausal atrophic vaginitis: Secondary | ICD-10-CM | POA: Diagnosis not present

## 2018-01-26 DIAGNOSIS — Z780 Asymptomatic menopausal state: Secondary | ICD-10-CM

## 2018-01-26 DIAGNOSIS — L900 Lichen sclerosus et atrophicus: Secondary | ICD-10-CM | POA: Diagnosis not present

## 2018-01-26 NOTE — Progress Notes (Signed)
Chief complaint: 1.  Lichen sclerosus 2.  Vaginal atrophy  Jessica Sullivan presents today for 74-month interval follow-up.  He is using Temovate ointment 0.05% topically twice a week to the vulva.  She remains asymptomatic without vulvar burning, vulvar itching, or vaginal bleeding or discharge. She is sexually active with intercourse; she has mild vaginal dryness and does occasionally use lubricants. Patient desires a transition to Jessica Sullivan for follow-up.  Interval health issues include right knee replacement in August 2019; currently she is doing extremely well walking approximately 3 miles a day.    Past mental history, past surgical history, problem list, medications, and allergies are reviewed  OBJECTIVE: BP (!) 144/82   Ht 5\' 2"  (1.575 m)   Wt 136 lb 1.6 oz (61.7 kg)   BMI 24.89 kg/m  Pleasant well-appearing female in no acute distress.  Alert and oriented. Abdomen: Soft, nontender without organomegaly Bladder: Nontender Pelvic exam: External genitalia-minimal bilateral labial erythema; no tenderness to palpation; minimal agglutination of labia minora; no leukoplakia or epithelial skin breakdown. Vagina-moderate atrophy Cervix-no lesions; nontender Uterus-small midplane mobile nontender Rectovaginal-normal  ASSESSMENT: 1.  Lichen sclerosis, stable with medication management 2.  Vaginal atrophy, stable  PLAN: 1.  Continue Temovate ointment 0.05% topically twice a week 2.  Return in 6 months for annual Medicare physical with Jessica Sullivan and follow-up on her lichen sclerosus and vaginal atrophy 3.  Consider over-the-counter lubricants for vaginal dryness-Olive  oil, coconut oil, Jo H2O lubricant. 4.  Continue seeing Jessica Sullivan for annual health care.  A total of 15 minutes were spent face-to-face with the patient during this encounter and over half of that time dealt with counseling and coordination of care.  Jessica Mars, MD  Note: This dictation was prepared with Dragon  dictation along with smaller phrase technology. Any transcriptional errors that result from this process are unintentional.

## 2018-01-26 NOTE — Patient Instructions (Signed)
1.  Continue Temovate ointment 0.05% topically twice a week to the vulva 2.  Consider over-the-counter lubricants for vaginal dryness including:  Olive oil  Coconut oil  JO h20 Lubricant 3.  Return in 6 months for Medicare physical-Dr. Marcelline Mates

## 2018-02-13 DIAGNOSIS — M25562 Pain in left knee: Secondary | ICD-10-CM | POA: Diagnosis not present

## 2018-02-13 DIAGNOSIS — M1712 Unilateral primary osteoarthritis, left knee: Secondary | ICD-10-CM | POA: Diagnosis not present

## 2018-03-29 ENCOUNTER — Encounter: Payer: Self-pay | Admitting: Internal Medicine

## 2018-03-29 ENCOUNTER — Ambulatory Visit (INDEPENDENT_AMBULATORY_CARE_PROVIDER_SITE_OTHER): Payer: Medicare HMO | Admitting: Internal Medicine

## 2018-03-29 VITALS — BP 122/82 | HR 75 | Temp 98.1°F | Ht 61.5 in | Wt 138.0 lb

## 2018-03-29 DIAGNOSIS — Z0000 Encounter for general adult medical examination without abnormal findings: Secondary | ICD-10-CM

## 2018-03-29 DIAGNOSIS — M170 Bilateral primary osteoarthritis of knee: Secondary | ICD-10-CM

## 2018-03-29 DIAGNOSIS — E785 Hyperlipidemia, unspecified: Secondary | ICD-10-CM | POA: Diagnosis not present

## 2018-03-29 DIAGNOSIS — I83812 Varicose veins of left lower extremities with pain: Secondary | ICD-10-CM | POA: Diagnosis not present

## 2018-03-29 DIAGNOSIS — Z7189 Other specified counseling: Secondary | ICD-10-CM

## 2018-03-29 DIAGNOSIS — Z1211 Encounter for screening for malignant neoplasm of colon: Secondary | ICD-10-CM

## 2018-03-29 NOTE — Assessment & Plan Note (Signed)
I have personally reviewed the Medicare Annual Wellness questionnaire and have noted 1. The patient's medical and social history 2. Their use of alcohol, tobacco or illicit drugs 3. Their current medications and supplements 4. The patient's functional ability including ADL's, fall risks, home safety risks and hearing or visual             impairment. 5. Diet and physical activities 6. Evidence for depression or mood disorders  The patients weight, height, BMI and visual acuity have been recorded in the chart I have made referrals, counseling and provided education to the patient based review of the above and I have provided the pt with a written personalized care plan for preventive services.  I have provided you with a copy of your personalized plan for preventive services. Please take the time to review along with your updated medication list.  Mammogram soon---and every 1-2 years till at least 51 FIT Exercises regularly--discussed resistance work Yearly flu vaccine

## 2018-03-29 NOTE — Assessment & Plan Note (Signed)
Mild Just on fish oil

## 2018-03-29 NOTE — Assessment & Plan Note (Signed)
See social history 

## 2018-03-29 NOTE — Progress Notes (Signed)
Hearing Screening   Method: Audiometry   125Hz  250Hz  500Hz  1000Hz  2000Hz  3000Hz  4000Hz  6000Hz  8000Hz   Right ear:   25 25 20  20     Left ear:   20 25 20  25     Vision Screening Comments: April 2019

## 2018-03-29 NOTE — Progress Notes (Signed)
Subjective:    Patient ID: Jessica Sullivan, female    DOB: 1945-12-18, 73 y.o.   MRN: [id number]  HPI Here for Medicare wellness visit and follow up of chronic health conditions Reviewed form and advanced directives Reviewed other doctors Occasional drink of alcohol No tobacco Vision and hearing are fine Walks daily No falls No depression or anhedonia Independent with instrumental ADLs No sig memory issues  Had TKR---made a great recovery No longer has any pain on right Did get cortisone shot in left knee---helped  Still sees gyn---Dr DeFrancesco retired Will see new one  No heartburn since stopping the aspirin No dysphagia  No edema Still thinks her legs are "ugly" Support hose bother her  Current Outpatient Medications on File Prior to Visit  Medication Sig Dispense Refill  . Biotin 10 MG CAPS Take 1 tablet by mouth daily.     . Calcium Carbonate-Vitamin D (CALCIUM + D PO) Take 1 tablet by mouth 2 (two) times daily.     . clobetasol ointment (TEMOVATE) 0.05 % Twice a week (Patient taking differently: Apply 1 application topically 2 (two) times a week. Twice a week) 30 g 2  . cyanocobalamin 100 MCG tablet Take 100 mcg by mouth daily.    . Glucosamine HCl (GLUCOSAMINE PO) Take 1 tablet by mouth 2 (two) times daily.     . Multiple Vitamins-Minerals (CENTRUM SILVER ADULT 50+) TABS Take 1 tablet by mouth daily.    . Multiple Vitamins-Minerals (ICAPS AREDS 2) CAPS Take 1 capsule by mouth 2 (two) times daily.    . Omega-3 Fatty Acids (FISH OIL) 1000 MG CAPS Take 1 capsule by mouth daily.      No current facility-administered medications on file prior to visit.     Allergies  Allergen Reactions  . Oxycodone Other (See Comments)    Light headed.  Marland Kitchen Peach Flavor [Flavoring Agent]     Only peach snapps  . Poison Ivy Extract Itching and Rash    Past Medical History:  Diagnosis Date  . Chronic venous insufficiency   . Hyperlipidemia     Past Surgical History:    Procedure Laterality Date  . [place]  . Chin lift  8/14  . DILATION AND CURETTAGE OF UTERUS  1990   year ago- polyp  . HAMMER TOE SURGERY  12/13  . KNEE ARTHROSCOPY Right 3/13   meniscus  . MPJ Release 2nd toe left    . TOTAL KNEE ARTHROPLASTY Right 10/11/2017   Procedure: TOTAL KNEE ARTHROPLASTY;  Surgeon: Hessie Knows, MD;  Location: [facility] ORS;  Service: Orthopedics;  Laterality: Right;  Marland Kitchen VARICOSE VEIN SURGERY  2004, 2011    Family History  Problem Relation Age of Onset  . Dementia Mother   . Cancer Father   . Cancer Brother        prostate/prostate  . Dementia Brother        corticobasilar degeneration  . Parkinson's disease Brother   . Cancer Brother        prostate  . Heart disease Neg Hx   . Diabetes Neg Hx   . Breast cancer Neg Hx   . Ovarian cancer Neg Hx   . Colon cancer Neg Hx     Social History   Socioeconomic History  . Marital status: Married    Spouse name: Not on file  . Number of children: 1  . Years of education: Not on file  . Highest education level: Not on file  Occupational History  . 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 Financial resource strain: Not on file  . Food insecurity:    Worry: Not on file    Inability: Not on file  . Transportation needs:    Medical: Not on file    Non-medical: Not on file  Tobacco Use  . Smoking status: Never Smoker  . Smokeless tobacco: Never Used  Substance and Sexual Activity  . Alcohol use: Yes    Comment: once a week  . Drug use: No  . Sexual activity: Yes    Birth control/protection: Post-menopausal  Lifestyle  . Physical activity:    Days per week: Not on file    Minutes per session: Not on file  . Stress: Not on file  Relationships  . Social connections:    Talks on phone: Not on file    Gets together: Not on file    Attends religious service: Not on file    Active member of club or organization: Not on  file    Attends meetings of clubs or organizations: Not on file    Relationship status: Not on file  . Intimate partner violence:    Fear of current or ex partner: Not on file    Emotionally abused: Not on file    Physically abused: Not on file    Forced sexual activity: Not on file  Other Topics Concern  . Not on file  Social History Narrative   Has living will   Husband is health care POA   Would accept resuscitation attempts but no prolonged artificial ventilation   No tube feeds if cognitively unaware   Review of Systems Had bad reaction to second shingrix Appetite is fine Weight is stable now---had lost 10# after the surgery Doesn't sleep well--falls asleep on couch at 8PM, then gets up to go to bed later. No typical daytime somnolence Wears seat belt Teeth fine---keeps up with dentist Sees derm--no current problems Bowels are fine No urinary problems---continent No chest pain or SOB No palpitations    Objective:   Physical Exam  Constitutional: She is oriented to person, place, and time. She appears well-developed. No distress.  HENT:  Mouth/Throat: Oropharynx is clear and moist. No oropharyngeal exudate.  Neck: No thyromegaly present.  Cardiovascular: Normal rate, regular rhythm, normal heart sounds and intact distal pulses. Exam reveals no gallop.  No murmur heard. Respiratory: Effort normal and breath sounds normal. No respiratory distress. She has no wheezes. She has no rales.  GI: Soft. There is no abdominal tenderness.  Musculoskeletal:        General: No tenderness or edema.  Lymphadenopathy:    She has no cervical adenopathy.  Neurological: She is alert and oriented to person, place, and time.  President--- "Jessica Sullivan, [place]" [phone] D-[id number] Recall 3/3  Skin: No rash noted. No erythema.  Psychiatric: She has a normal mood and affect. Her behavior is normal.           Assessment & Plan:

## 2018-03-29 NOTE — Assessment & Plan Note (Signed)
No pain now Discussed support hose for travel, etc

## 2018-03-29 NOTE — Assessment & Plan Note (Signed)
Right knee great now Left knee with mild pain--got cortisone shot

## 2018-04-06 ENCOUNTER — Other Ambulatory Visit (INDEPENDENT_AMBULATORY_CARE_PROVIDER_SITE_OTHER): Payer: Medicare HMO

## 2018-04-06 ENCOUNTER — Other Ambulatory Visit: Payer: Self-pay | Admitting: Internal Medicine

## 2018-04-06 DIAGNOSIS — Z1211 Encounter for screening for malignant neoplasm of colon: Secondary | ICD-10-CM

## 2018-04-06 LAB — FECAL OCCULT BLOOD, IMMUNOCHEMICAL: Fecal Occult Bld: NEGATIVE

## 2018-05-10 ENCOUNTER — Telehealth: Payer: Self-pay | Admitting: Obstetrics and Gynecology

## 2018-05-10 MED ORDER — CLOBETASOL PROPIONATE 0.05 % EX OINT: 1.0000 "application " | TOPICAL_OINTMENT | CUTANEOUS | 3 refills | Status: DC

## 2018-05-10 NOTE — Telephone Encounter (Signed)
Patient called requesting a refill [facility].Thanks

## 2018-05-10 NOTE — Telephone Encounter (Signed)
Pt called and informed that her medication had been refilled for 3 months until her appointment with [facility] in May. Pt stated that she was okay with that.

## 2018-05-17 DIAGNOSIS — M1712 Unilateral primary osteoarthritis, left knee: Secondary | ICD-10-CM | POA: Diagnosis not present

## 2018-07-11 DIAGNOSIS — M1712 Unilateral primary osteoarthritis, left knee: Secondary | ICD-10-CM | POA: Diagnosis not present

## 2018-07-27 ENCOUNTER — Encounter: Payer: Medicare Other | Admitting: Obstetrics and Gynecology

## 2018-08-24 DIAGNOSIS — D1800 Hemangioma unspecified site: Secondary | ICD-10-CM | POA: Diagnosis not present

## 2018-08-24 DIAGNOSIS — I8393 Asymptomatic varicose veins of bilateral lower extremities: Secondary | ICD-10-CM | POA: Diagnosis not present

## 2018-08-24 DIAGNOSIS — D485 Neoplasm of uncertain behavior of skin: Secondary | ICD-10-CM | POA: Diagnosis not present

## 2018-08-24 DIAGNOSIS — L820 Inflamed seborrheic keratosis: Secondary | ICD-10-CM | POA: Diagnosis not present

## 2018-08-24 DIAGNOSIS — D225 Melanocytic nevi of trunk: Secondary | ICD-10-CM | POA: Diagnosis not present

## 2018-08-24 DIAGNOSIS — Z1283 Encounter for screening for malignant neoplasm of skin: Secondary | ICD-10-CM | POA: Diagnosis not present

## 2018-08-24 DIAGNOSIS — L821 Other seborrheic keratosis: Secondary | ICD-10-CM | POA: Diagnosis not present

## 2018-08-24 DIAGNOSIS — I781 Nevus, non-neoplastic: Secondary | ICD-10-CM | POA: Diagnosis not present

## 2018-08-24 DIAGNOSIS — L578 Other skin changes due to chronic exposure to nonionizing radiation: Secondary | ICD-10-CM | POA: Diagnosis not present

## 2018-08-24 DIAGNOSIS — L812 Freckles: Secondary | ICD-10-CM | POA: Diagnosis not present

## 2018-10-03 NOTE — Progress Notes (Signed)
Pt is present today for annual exam. Pt stated that she is doing well no problems.  

## 2018-10-04 ENCOUNTER — Encounter: Payer: Self-pay | Admitting: Obstetrics and Gynecology

## 2018-10-04 ENCOUNTER — Other Ambulatory Visit: Payer: Self-pay

## 2018-10-04 ENCOUNTER — Ambulatory Visit (INDEPENDENT_AMBULATORY_CARE_PROVIDER_SITE_OTHER): Payer: Medicare HMO | Admitting: Obstetrics and Gynecology

## 2018-10-04 VITALS — BP 122/70 | HR 70 | Ht 61.5 in | Wt 135.6 lb

## 2018-10-04 DIAGNOSIS — Z780 Asymptomatic menopausal state: Secondary | ICD-10-CM

## 2018-10-04 DIAGNOSIS — L900 Lichen sclerosus et atrophicus: Secondary | ICD-10-CM

## 2018-10-04 DIAGNOSIS — T63301A Toxic effect of unspecified spider venom, accidental (unintentional), initial encounter: Secondary | ICD-10-CM

## 2018-10-04 DIAGNOSIS — Z01419 Encounter for gynecological examination (general) (routine) without abnormal findings: Secondary | ICD-10-CM

## 2018-10-04 DIAGNOSIS — N952 Postmenopausal atrophic vaginitis: Secondary | ICD-10-CM

## 2018-10-04 NOTE — Patient Instructions (Signed)

## 2018-10-04 NOTE — Progress Notes (Signed)
ANNUAL PREVENTATIVE CARE GYNECOLOGY  ENCOUNTER NOTE  Subjective:       Jessica Sullivan is a 73 y.o. G45P1001 female here for a routine annual gynecologic exam. She is transitioning care from Dr. Enzo Bi, who has retired. She is overall well. She has stayed healthy throughout Covid. She walks her dogs 3 miles daily. Drinks V8 for vegetables/fruits. She has a PMH of   Past Medical History:  Diagnosis Date  . Chronic venous insufficiency   . Hyperlipidemia   . Lichen sclerosus 70/11/6281   Vulvar biopsy-lichen sclerosus   . Osteoarthritis of both knees 03/16/2017    She has the following concerns today:  1. She notes a likely spider bite that she got 3 weeks ago, seems to be slowly resolving. She's been using cortisone on it and icing it a bit. Would like for it to be checked to make sure it is going away.   2. Has been using her  Temovate as prescribed (down to once or twice weekly). Notes it is working well for her, denies itching, dryness.    PCP: Venia Carbon, MD  She does the Fecal Occult test and annual labs with her PCP. Per pt, no colonoscopies unless fecal occult is abnormal.    Gynecologic History No LMP recorded. Patient is postmenopausal.  She denies post menopausal bleeding.  Last Pap with HPV: 01/2015, normal. No history of abnormal paps. Last mammogram: 03/2017, normal. Last Colonoscopy: 2004, normal. Fecal occult negative 03/2018.  Last Dexa Scan: 08/07/2010   Obstetric History OB History  Gravida Para Term Preterm AB Living  1 1 1     1   SAB TAB Ectopic Multiple Live Births          1    # Outcome Date GA Lbr Len/2nd Weight Sex Delivery Anes PTL Lv  1 Term 1978   3901 g M CS-LVertical   LIV    Past Medical History:  Diagnosis Date  . Chronic venous insufficiency   . Hyperlipidemia     Family History  Problem Relation Age of Onset  . Dementia Mother   . Cancer Father   . Cancer Brother        prostate/prostate  . Dementia Brother    corticobasilar degeneration  . Parkinson's disease Brother   . Cancer Brother        prostate  . Heart disease Neg Hx   . Diabetes Neg Hx   . Breast cancer Neg Hx   . Ovarian cancer Neg Hx   . Colon cancer Neg Hx     Past Surgical History:  Procedure Laterality Date  . [address]  . Chin lift  8/14  . DILATION AND CURETTAGE OF UTERUS  1990   year ago- polyp  . HAMMER TOE SURGERY  12/13  . KNEE ARTHROSCOPY Right 3/13   meniscus  . MPJ Release 2nd toe left    . TOTAL KNEE ARTHROPLASTY Right 10/11/2017   Procedure: TOTAL KNEE ARTHROPLASTY;  Surgeon: Hessie Knows, MD;  Location: [facility] ORS;  Service: Orthopedics;  Laterality: Right;  Marland Kitchen VARICOSE VEIN SURGERY  2004, 2011    Social History   Socioeconomic History  . Marital status: Married    Spouse name: Not on file  . Number of children: 1  . Years of education: Not on file  . Highest education level: Not on file  Occupational History  . 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 Financial resource strain: Not on file  . Food insecurity    Worry: Not on file    Inability: Not on file  . Transportation needs    Medical: Not on file    Non-medical: Not on file  Tobacco Use  . Smoking status: Never Smoker  . Smokeless tobacco: Never Used  Substance and Sexual Activity  . Alcohol use: Yes    Comment: once a week  . Drug use: No  . Sexual activity: Yes    Birth control/protection: Post-menopausal  Lifestyle  . Physical activity    Days per week: Not on file    Minutes per session: Not on file  . Stress: Not on file  Relationships  . Social Herbalist on phone: Not on file    Gets together: Not on file    Attends religious service: Not on file    Active member of club or organization: Not on file    Attends meetings of clubs or organizations: Not on file    Relationship status: Not on file  . Intimate partner violence    Fear of  current or ex partner: Not on file    Emotionally abused: Not on file    Physically abused: Not on file    Forced sexual activity: Not on file  Other Topics Concern  . Not on file  Social History Narrative   Has living will   Husband is health care POA--- son is alternate   Would accept resuscitation attempts but no prolonged artificial ventilation   No tube feeds if cognitively unaware    Current Outpatient Medications on File Prior to Visit  Medication Sig Dispense Refill  . Biotin 10 MG CAPS Take 1 tablet by mouth daily.     . Calcium Carbonate-Vitamin D (CALCIUM + D PO) Take 1 tablet by mouth 2 (two) times daily.     . clobetasol ointment (TEMOVATE) 6.96 % Apply 1 application topically 2 (two) times a week. Twice a week 30 g 3  . cyanocobalamin 100 MCG tablet Take 100 mcg by mouth daily.    . Glucosamine HCl (GLUCOSAMINE PO) Take 1 tablet by mouth 2 (two) times daily.     . Multiple Vitamins-Minerals (CENTRUM SILVER ADULT 50+) TABS Take 1 tablet by mouth daily.    . Multiple Vitamins-Minerals (ICAPS AREDS 2) CAPS Take 1 capsule by mouth 2 (two) times daily.    . Omega-3 Fatty Acids (FISH OIL) 1000 MG CAPS Take 1 capsule by mouth daily.      No current facility-administered medications on file prior to visit.     Allergies  Allergen Reactions  . Oxycodone Other (See Comments)    Light headed.  Marland Kitchen Peach Flavor [Flavoring Agent]     Only peach snapps  . Poison Ivy Extract Itching and Rash      Review of Systems ROS Review of Systems - General ROS: negative for - chills, fatigue, fever, hot flashes, night sweats, weight gain or weight loss Psychological ROS: negative for - anxiety, decreased libido, depression, mood swings, physical abuse or sexual abuse Ophthalmic ROS: negative for - blurry vision, eye pain or loss of vision ENT ROS: negative for - headaches, hearing change, visual changes or vocal changes Allergy and Immunology ROS: negative for - hives, itchy/watery eyes  or seasonal allergies Hematological and Lymphatic ROS: negative for - bleeding problems, bruising, swollen lymph nodes or weight loss Endocrine ROS: negative for -  galactorrhea, hair pattern changes, hot flashes, malaise/lethargy, mood swings, palpitations, polydipsia/polyuria, skin changes, temperature intolerance or unexpected weight changes Breast ROS: negative for - new or changing breast lumps or nipple discharge Respiratory ROS: negative for - cough or shortness of breath Cardiovascular ROS: negative for - chest pain, irregular heartbeat, palpitations or shortness of breath Gastrointestinal ROS: no abdominal pain, change in bowel habits, or black or bloody stools Genito-Urinary ROS: no dysuria, trouble voiding, or hematuria.Musculoskeletal ROS: negative for - joint pain or joint stiffness Neurological ROS: negative for - bowel and bladder control changes Dermatological ROS:  Spider bite on inside right thigh, improving.    Objective:   General Appearance:    Alert, cooperative, no distress, appears stated age  HEENT:  Grossly normal  Neck:   Supple, symmetrical, trachea midline, no adenopathy; thyroid: no enlargement/tenderness/nodules; no carotid bruit or JVD  Lungs:     Clear to auscultation bilaterally, respirations unlabored   Heart:    Regular rate and rhythm, S1 and S2 normal, no murmur, rub or gallop  Breast Exam:    No tenderness, masses, or nipple abnormality  Abdomen:     Soft, non-tender, bowel sounds active all four quadrants, no masses, no organomegaly.    Genitalia:    Pelvic:external genitalia normal, labia majora slightly erythematous, vagina without lesions, discharge, or tenderness, rectovaginal septum  normal. Cervix normal in appearance, no cervical motion tenderness, no adnexal masses or tenderness.  Uterus normal size, shape, mobile, regular contours, nontender.  Rectal:    Normal external sphincter.  No hemorrhoids appreciated. Internal exam done by MD         Skin:   Skin color, texture, turgor normal. Right inside thigh- dime-sized area of ecchymosis, no fluctuance, no discharge      Neurologic:   Grossly normal     Labs: Lab Results  Component Value Date   WBC 12.5 (H) 10/12/2017   HGB 12.5 10/12/2017   HCT 35.6 10/12/2017   MCV 87.3 10/12/2017   PLT 234 10/12/2017    Lab Results  Component Value Date   CREATININE 0.71 10/12/2017   BUN 16 10/12/2017   NA 139 10/12/2017   K 4.1 10/12/2017   CL 107 10/12/2017   CO2 27 10/12/2017    Lab Results  Component Value Date   ALT 17 03/16/2017   AST 16 03/16/2017   ALKPHOS 80 03/16/2017   BILITOT 0.4 03/16/2017    Lab Results  Component Value Date   CHOL 190 03/16/2017   HDL 63.30 03/16/2017   LDLCALC 110 (H) 03/16/2017   TRIG 80.0 03/16/2017   CHOLHDL 3 03/16/2017    No results found for: TSH  No results found for: HGBA1C   Assessment & Plan   Annual gynecologic examination 73 y.o.  Problem List Items Addressed This Visit      Musculoskeletal and Integument   Lichen sclerosus     Genitourinary   Vaginal atrophy     Other   Menopause    Other Visit Diagnoses    Encounter for routine gynecologic examination in Medicare patient    -  Primary   Spider bite wound, accidental or unintentional, initial encounter         1. lichen sclerosis, continue Temovate 0.05% ointment. She remains asymptomatic without vulvar burning, vulvar itching, or vaginal bleeding or discharge. 2. Spider bite, continue ice prn. Can continue use of cortisone, or can use Neopsorin. F/u if the area does not resolve.  3. Contraception: none, postmenopausal 4.  Pap: normal 2016, no further paps needed. 5. Mammogram: normal 03/2017. Continue every other year mammograms.  6. Stool Guaiac/colonoscopy: normal, followed by PCP for this. Last performed 03/2018.  7. Labs:to be ordered by PCP.  8. Encouraged healthy lifestyle modifications.  9. Return to [place] or as needed.  Continue also  following with PCP Venia Carbon, MD for annual health care.   Marin Roberts, PA-S 10/04/18       I have seen and examined the patient with Marin Roberts, [place] PA-S.  I have reviewed/edit  the record and concur with patient management and plan.   Rubie Maid, MD [facility]

## 2018-10-17 DIAGNOSIS — R69 Illness, unspecified: Secondary | ICD-10-CM | POA: Diagnosis not present

## 2018-11-02 DIAGNOSIS — H5213 Myopia, bilateral: Secondary | ICD-10-CM | POA: Diagnosis not present

## 2018-11-19 DIAGNOSIS — R69 Illness, unspecified: Secondary | ICD-10-CM | POA: Diagnosis not present

## 2018-11-22 DIAGNOSIS — Z0100 Encounter for examination of eyes and vision without abnormal findings: Secondary | ICD-10-CM | POA: Diagnosis not present

## 2019-02-12 DIAGNOSIS — M1712 Unilateral primary osteoarthritis, left knee: Secondary | ICD-10-CM | POA: Diagnosis not present

## 2019-03-12 ENCOUNTER — Other Ambulatory Visit: Payer: Self-pay | Admitting: Internal Medicine

## 2019-03-12 DIAGNOSIS — Z1231 Encounter for screening mammogram for malignant neoplasm of breast: Secondary | ICD-10-CM

## 2019-03-28 ENCOUNTER — Ambulatory Visit: Payer: Medicare Other | Attending: Internal Medicine

## 2019-03-28 DIAGNOSIS — Z23 Encounter for immunization: Secondary | ICD-10-CM | POA: Insufficient documentation

## 2019-03-28 NOTE — Progress Notes (Signed)
   Covid-19 Vaccination Clinic  Name:  Jessica Sullivan    MRN: [id number] DOB: 19-Aug-1945  03/28/2019  [name] was observed post Covid-19 immunization for 15 minutes without incidence. She was provided with Vaccine Information Sheet and instruction to access the V-Safe system.   [name] was instructed to call 911 with any severe reactions post vaccine: Marland Kitchen Difficulty breathing  . Swelling of your face and throat  . A fast heartbeat  . A bad rash all over your body  . Dizziness and weakness    Immunizations Administered    Name Date Dose VIS Date Route   Pfizer COVID-19 Vaccine 03/28/2019 10:07 AM 0.3 mL 02/16/2019 Intramuscular   Manufacturer: [employer]   Lot: [id number]   [place]: [id number]

## 2019-04-04 ENCOUNTER — Encounter: Payer: Medicare HMO | Admitting: Internal Medicine

## 2019-04-08 IMAGING — DX DG KNEE 1-2V*R*
2 series · 2 of 2 positions shown · non-contrast
Comparison: CT scan of the right knee September 12, 2017

CLINICAL DATA: Status post right knee joint prosthesis placement.

EXAM:
RIGHT KNEE - 1-2 VIEW

[knee ap]
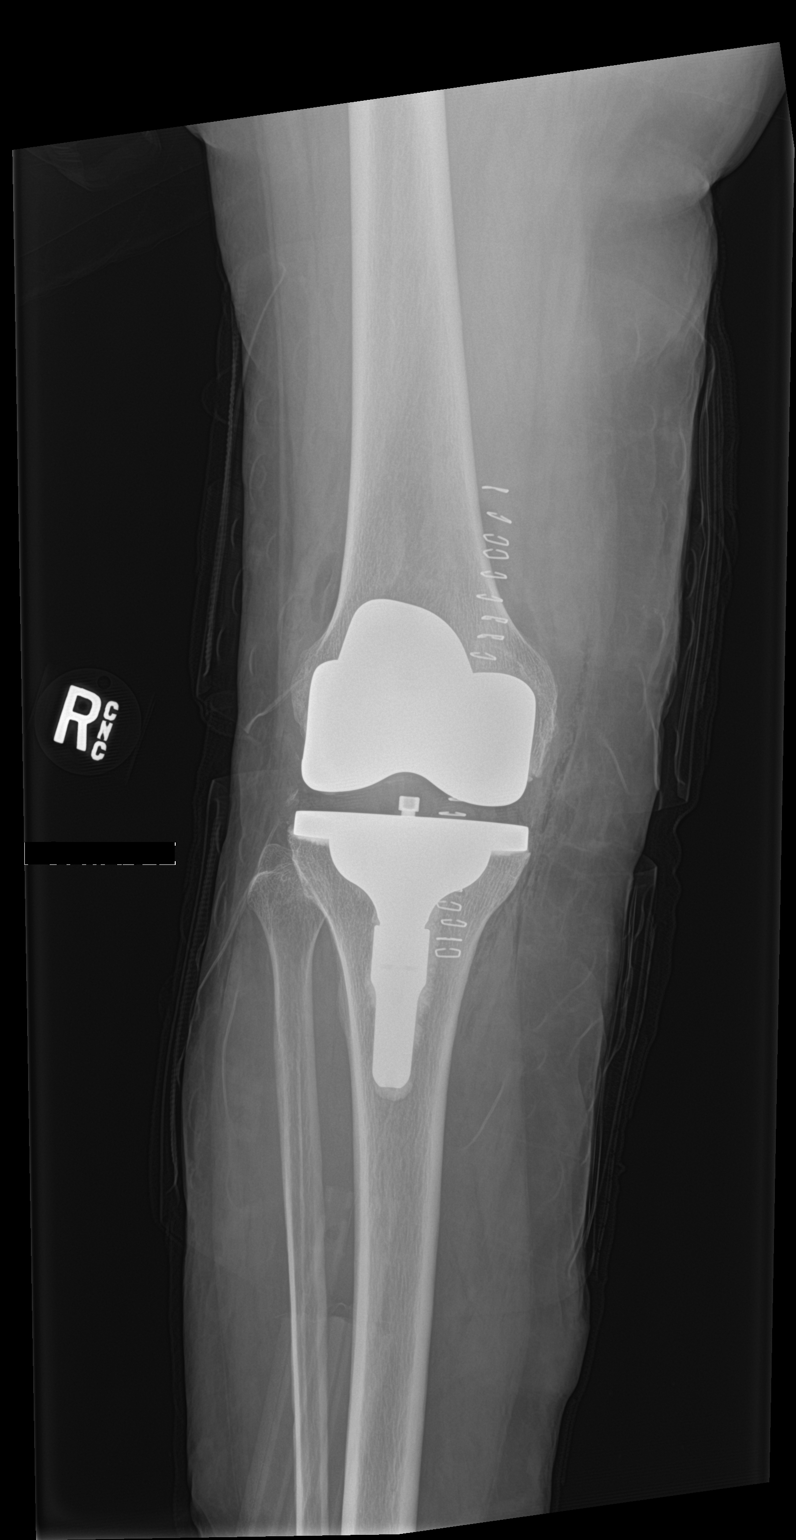

[knee lat]
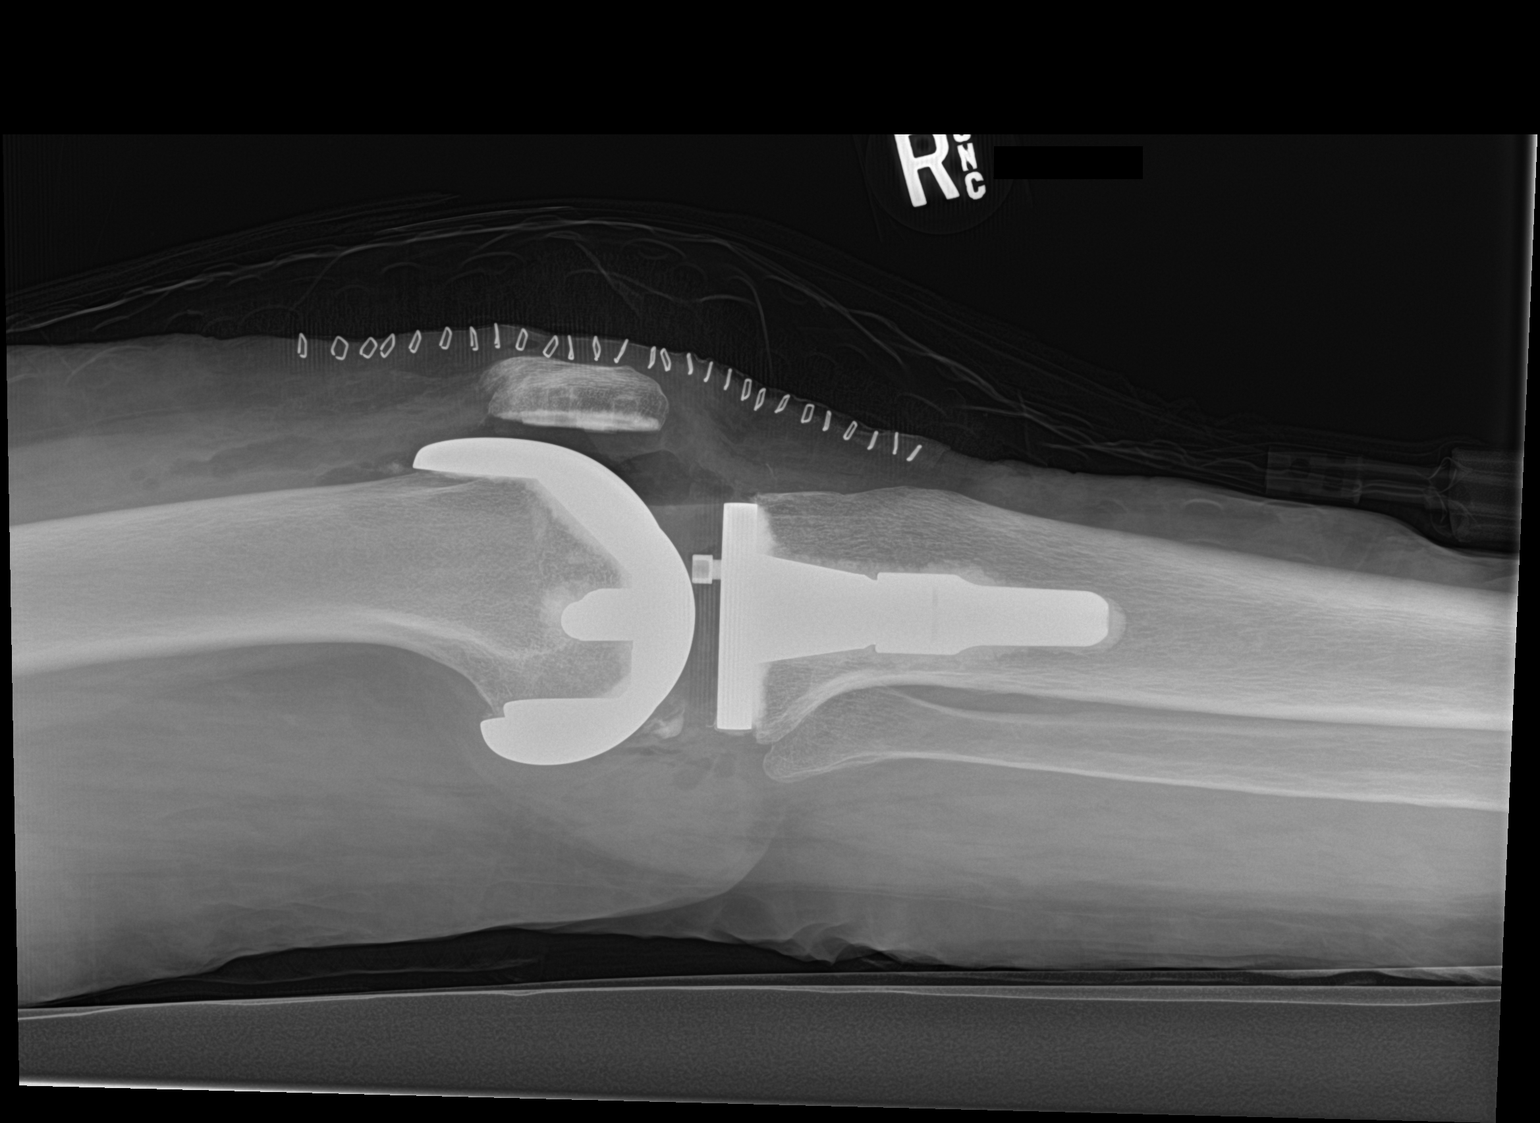

[2 of 2 positions shown; findings below may reference images not displayed]

FINDINGS: The patient has undergone total right knee joint prosthesis
placement. Radiographic positioning of the prosthetic components is
good. The interface with the native bone appears normal. There is no
acute native bone abnormality. A small amount of fluid and air is
present in the anterior soft tissues.
IMPRESSION: There is no acute postprocedure complication following right total
knee joint prosthesis placement.

## 2019-04-16 ENCOUNTER — Ambulatory Visit: Payer: Medicare HMO | Attending: Internal Medicine

## 2019-04-16 DIAGNOSIS — Z23 Encounter for immunization: Secondary | ICD-10-CM | POA: Insufficient documentation

## 2019-04-16 NOTE — Progress Notes (Signed)
   Covid-19 Vaccination Clinic  Name:  Jessica Sullivan    MRN: [id number] DOB: April 22, 1945  04/16/2019  [name] was observed post Covid-19 immunization for 15 minutes without incidence. She was provided with Vaccine Information Sheet and instruction to access the V-Safe system.   [name] was instructed to call 911 with any severe reactions post vaccine: Marland Kitchen Difficulty breathing  . Swelling of your face and throat  . A fast heartbeat  . A bad rash all over your body  . Dizziness and weakness    Immunizations Administered    Name Date Dose VIS Date Route   Pfizer COVID-19 Vaccine 04/16/2019  1:59 PM 0.3 mL 02/16/2019 Intramuscular   Manufacturer: [place]   Lot: [id number]   [place]: [id number]

## 2019-04-24 ENCOUNTER — Ambulatory Visit: Payer: Medicare HMO

## 2019-05-15 DIAGNOSIS — R69 Illness, unspecified: Secondary | ICD-10-CM | POA: Diagnosis not present

## 2019-05-23 ENCOUNTER — Encounter: Payer: Self-pay | Admitting: Internal Medicine

## 2019-05-23 ENCOUNTER — Ambulatory Visit (INDEPENDENT_AMBULATORY_CARE_PROVIDER_SITE_OTHER): Payer: Medicare HMO | Admitting: Internal Medicine

## 2019-05-23 ENCOUNTER — Other Ambulatory Visit: Payer: Self-pay

## 2019-05-23 VITALS — BP 122/78 | HR 86 | Temp 97.6°F | Ht 61.5 in | Wt 137.0 lb

## 2019-05-23 DIAGNOSIS — M170 Bilateral primary osteoarthritis of knee: Secondary | ICD-10-CM | POA: Diagnosis not present

## 2019-05-23 DIAGNOSIS — L900 Lichen sclerosus et atrophicus: Secondary | ICD-10-CM

## 2019-05-23 DIAGNOSIS — Z0000 Encounter for general adult medical examination without abnormal findings: Secondary | ICD-10-CM

## 2019-05-23 DIAGNOSIS — I83812 Varicose veins of left lower extremities with pain: Secondary | ICD-10-CM | POA: Diagnosis not present

## 2019-05-23 DIAGNOSIS — Z7189 Other specified counseling: Secondary | ICD-10-CM

## 2019-05-23 DIAGNOSIS — Z1211 Encounter for screening for malignant neoplasm of colon: Secondary | ICD-10-CM | POA: Diagnosis not present

## 2019-05-23 LAB — COMPREHENSIVE METABOLIC PANEL
ALT: 19 U/L (ref 0–35)
AST: 15 U/L (ref 0–37)
Albumin: 4.1 g/dL (ref 3.5–5.2)
Alkaline Phosphatase: 98 U/L (ref 39–117)
BUN: 25 mg/dL — ABNORMAL HIGH (ref 6–23)
CO2: 31 mEq/L (ref 19–32)
Calcium: 9.1 mg/dL (ref 8.4–10.5)
Chloride: 104 mEq/L (ref 96–112)
Creatinine, Ser: 0.7 mg/dL (ref 0.40–1.20)
GFR: 81.97 mL/min (ref >60.00)
Glucose, Bld: 115 mg/dL — ABNORMAL HIGH (ref 70–99)
Potassium: 3.6 mEq/L (ref 3.5–5.1)
Sodium: 141 mEq/L (ref 135–145)
Total Bilirubin: 0.4 mg/dL (ref 0.2–1.2)
Total Protein: 6.7 g/dL (ref 6.0–8.3)

## 2019-05-23 LAB — CBC
HCT: 42.5 % (ref 36.0–46.0)
Hemoglobin: 14.3 g/dL (ref 12.0–15.0)
MCHC: 33.7 g/dL (ref 30.0–36.0)
MCV: 89.2 fl (ref 78.0–100.0)
Platelets: 255 10*3/uL (ref 150.0–400.0)
RBC: 4.77 Mil/uL (ref 3.87–5.11)
RDW: 13.8 % (ref 11.5–15.5)
WBC: 5 10*3/uL (ref 4.0–10.5)

## 2019-05-23 NOTE — Assessment & Plan Note (Signed)
Left knee bothering her some Not ready for TKR yet Consider voltaren gel

## 2019-05-23 NOTE — Assessment & Plan Note (Signed)
Not severe now Discussed support hose for prolonged sitting

## 2019-05-23 NOTE — Assessment & Plan Note (Signed)
See social history 

## 2019-05-23 NOTE — Progress Notes (Signed)
Subjective:    Patient ID: Jessica Sullivan, female    DOB: 01-02-1946, 74 y.o.   MRN: [id number]  HPI Here for Medicare wellness visit and follow up of chronic health conditions This visit occurred during the SARS-CoV-2 public health emergency.  Safety protocols were in place, including screening questions prior to the visit, additional usage of staff PPE, and extensive cleaning of exam room while observing appropriate contact time as indicated for disinfecting solutions.   Reviewed form and advanced directives Reviewed other doctors Occasional alcohol No tobacco Still walks regularly Vision and hearing are fine No falls No depression or anhedonia Independent with instrumental ADLs No sig memory problems  Having pain in left knee now Considering TKR but holding off Had gel infection and then cortisone  Still feels her legs are "ugly" Occasional pain Will use support hose if traveling---does have zip up ones that are easier to use  Sees gynecologist regularly Uses temovate twice a week "as precaution"  Current Outpatient Medications on File Prior to Visit  Medication Sig Dispense Refill  . Biotin 10 MG CAPS Take 1 tablet by mouth daily.     . Calcium Carbonate-Vitamin D (CALCIUM + D PO) Take 1 tablet by mouth 2 (two) times daily.     . clobetasol ointment (TEMOVATE) AB-123456789 % Apply 1 application topically 2 (two) times a week. Twice a week 30 g 3  . cyanocobalamin 100 MCG tablet Take 100 mcg by mouth daily.    . Glucosamine HCl (GLUCOSAMINE PO) Take 1 tablet by mouth 2 (two) times daily.     . Multiple Vitamins-Minerals (CENTRUM SILVER ADULT 50+) TABS Take 1 tablet by mouth daily.    . Multiple Vitamins-Minerals (ICAPS AREDS 2) CAPS Take 1 capsule by mouth 2 (two) times daily.    . Omega-3 Fatty Acids (FISH OIL) 1000 MG CAPS Take 1 capsule by mouth daily.      No current facility-administered medications on file prior to visit.    Allergies  Allergen Reactions  .  Oxycodone Other (See Comments)    Light headed.  Marland Kitchen Peach Flavor [Flavoring Agent]     Only peach snapps  . Poison Ivy Extract Itching and Rash    Past Medical History:  Diagnosis Date  . Chronic venous insufficiency   . Hyperlipidemia   . Lichen sclerosus [id number]   Vulvar biopsy-lichen sclerosus   . Osteoarthritis of both knees 03/16/2017    Past Surgical History:  Procedure Laterality Date  . [place]  . Chin lift  8/14  . DILATION AND CURETTAGE OF UTERUS  1990   year ago- polyp  . HAMMER TOE SURGERY  12/13  . KNEE ARTHROSCOPY Right 3/13   meniscus  . MPJ Release 2nd toe left    . TOTAL KNEE ARTHROPLASTY Right 10/11/2017   Procedure: TOTAL KNEE ARTHROPLASTY;  Surgeon: Hessie Knows, MD;  Location: [facility] ORS;  Service: Orthopedics;  Laterality: Right;  Marland Kitchen VARICOSE VEIN SURGERY  2004, 2011    Family History  Problem Relation Age of Onset  . Dementia Mother   . Cancer Father   . Cancer Brother        prostate/prostate  . Dementia Brother        corticobasilar degeneration  . Parkinson's disease Brother   . Cancer Brother        prostate  . Heart disease Neg Hx   . Diabetes Neg Hx   . Breast cancer Neg Hx   . Ovarian cancer  Neg Hx   . Colon cancer Neg Hx     Social History   Socioeconomic History  . Marital status: Married    Spouse name: Not on file  . Number of children: 1  . Years of education: Not on file  . Highest education level: Not on file  Occupational History  . 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 Smoking status: Never Smoker  . Smokeless tobacco: Never Used  Substance and Sexual Activity  . Alcohol use: Yes    Comment: once a week  . Drug use: No  . Sexual activity: Yes    Birth control/protection: Post-menopausal  Other Topics Concern  . Not on file  Social History Narrative   Has living will   Husband is health care POA--- son is alternate   Would accept  resuscitation attempts but no prolonged artificial ventilation   No tube feeds if cognitively unaware   Social Determinants of Health   Financial Resource Strain:   . Difficulty of Paying Living Expenses:   Food Insecurity:   . Worried About Charity fundraiser in the Last Year:   . Arboriculturist in the Last Year:   Transportation Needs:   . Film/video editor (Medical):   Marland Kitchen Lack of Transportation (Non-Medical):   Physical Activity:   . Days of Exercise per Week:   . Minutes of Exercise per Session:   Stress:   . Feeling of Stress :   Social Connections:   . Frequency of Communication with Friends and Family:   . Frequency of Social Gatherings with Friends and Family:   . Attends Religious Services:   . Active Member of Clubs or Organizations:   . Attends Archivist Meetings:   Marland Kitchen Marital Status:   Intimate Partner Violence:   . Fear of Current or Ex-Partner:   . Emotionally Abused:   Marland Kitchen Physically Abused:   . Sexually Abused:    Review of Systems Appetite is okay Weight is stable Not a great sleeper---nothing new.No sig daytime somnolence Wears seat belt Teeth are fine---keeps up with dentist Occasional heartburn-- doesn't use meds. No dysphagia Bowels are fine--no blood No urinary problems. No incontinence No chest pain or SOB No palpitations No dizziness or syncope Rare back pain---like if does some lifting No rash or skin lesions of concern. Does have one yellow toenail (using local Rx)    Objective:   Physical Exam  Constitutional: She is oriented to person, place, and time. She appears well-developed. No distress.  HENT:  Mouth/Throat: Oropharynx is clear and moist. No oropharyngeal exudate.  Neck: No thyromegaly present.  Cardiovascular: Normal rate, regular rhythm, normal heart sounds and intact distal pulses. Exam reveals no gallop.  No murmur heard. Respiratory: Effort normal and breath sounds normal. No respiratory distress. She has no  wheezes. She has no rales.  GI: Soft. There is no abdominal tenderness.  Musculoskeletal:        General: No tenderness or edema.  Lymphadenopathy:    She has no cervical adenopathy.  Neurological: She is alert and oriented to person, place, and time.  President---"Biden, Trump, Obama" [id number] Recall 3/3  Skin: No rash noted. No erythema.  Psychiatric: She has a normal mood and affect. Her behavior is normal.           Assessment & Plan:

## 2019-05-23 NOTE — Assessment & Plan Note (Signed)
Uses the steroid cream twice a week

## 2019-05-23 NOTE — Patient Instructions (Signed)
You can try over the counter diclofenac gel on your knee to see if that helps

## 2019-05-23 NOTE — Assessment & Plan Note (Signed)
I have personally reviewed the Medicare Annual Wellness questionnaire and have noted 1. The patient's medical and social history 2. Their use of alcohol, tobacco or illicit drugs 3. Their current medications and supplements 4. The patient's functional ability including ADL's, fall risks, home safety risks and hearing or visual             impairment. 5. Diet and physical activities 6. Evidence for depression or mood disorders  The patients weight, height, BMI and visual acuity have been recorded in the chart I have made referrals, counseling and provided education to the patient based review of the above and I have provided the pt with a written personalized care plan for preventive services.  I have provided you with a copy of your personalized plan for preventive services. Please take the time to review along with your updated medication list.  Had COVID vaccines Will still do yearly FIT till 67 Has mammogram next week Stays fit Yearly flu vaccine

## 2019-05-23 NOTE — Progress Notes (Signed)
Hearing Screening   125Hz  250Hz  500Hz  1000Hz  2000Hz  3000Hz  4000Hz  6000Hz  8000Hz   Right ear:   25  25 25 25     Left ear:   25  25 25 25     Vision Screening Comments: [place] [facility] 10/2018

## 2019-05-25 ENCOUNTER — Other Ambulatory Visit (INDEPENDENT_AMBULATORY_CARE_PROVIDER_SITE_OTHER): Payer: Medicare HMO

## 2019-05-25 DIAGNOSIS — Z1211 Encounter for screening for malignant neoplasm of colon: Secondary | ICD-10-CM | POA: Diagnosis not present

## 2019-05-25 LAB — FECAL OCCULT BLOOD, IMMUNOCHEMICAL: Fecal Occult Bld: NEGATIVE

## 2019-05-29 ENCOUNTER — Ambulatory Visit
Admission: RE | Admit: 2019-05-29 | Discharge: 2019-05-29 | Disposition: A | Discharge status: Home / Self Care | Payer: Medicare HMO | Source: Ambulatory Visit | Attending: Internal Medicine | Admitting: Internal Medicine

## 2019-05-29 DIAGNOSIS — Z1231 Encounter for screening mammogram for malignant neoplasm of breast: Secondary | ICD-10-CM | POA: Diagnosis not present

## 2019-06-14 DIAGNOSIS — Z0100 Encounter for examination of eyes and vision without abnormal findings: Secondary | ICD-10-CM | POA: Diagnosis not present

## 2019-08-30 ENCOUNTER — Other Ambulatory Visit: Payer: Self-pay | Admitting: Obstetrics and Gynecology

## 2019-10-09 ENCOUNTER — Encounter: Payer: Medicare HMO | Admitting: Obstetrics and Gynecology

## 2019-10-22 DIAGNOSIS — I8311 Varicose veins of right lower extremity with inflammation: Secondary | ICD-10-CM

## 2019-10-25 ENCOUNTER — Ambulatory Visit: Payer: Medicare HMO | Admitting: Dermatology

## 2019-10-25 ENCOUNTER — Encounter: Payer: Self-pay | Admitting: Dermatology

## 2019-10-25 ENCOUNTER — Other Ambulatory Visit: Payer: Self-pay

## 2019-10-25 DIAGNOSIS — L578 Other skin changes due to chronic exposure to nonionizing radiation: Secondary | ICD-10-CM | POA: Diagnosis not present

## 2019-10-25 DIAGNOSIS — Z86018 Personal history of other benign neoplasm: Secondary | ICD-10-CM | POA: Diagnosis not present

## 2019-10-25 DIAGNOSIS — L649 Androgenic alopecia, unspecified: Secondary | ICD-10-CM | POA: Diagnosis not present

## 2019-10-25 DIAGNOSIS — D229 Melanocytic nevi, unspecified: Secondary | ICD-10-CM | POA: Diagnosis not present

## 2019-10-25 DIAGNOSIS — L814 Other melanin hyperpigmentation: Secondary | ICD-10-CM

## 2019-10-25 DIAGNOSIS — D1800 Hemangioma unspecified site: Secondary | ICD-10-CM | POA: Diagnosis not present

## 2019-10-25 DIAGNOSIS — L821 Other seborrheic keratosis: Secondary | ICD-10-CM

## 2019-10-25 DIAGNOSIS — I8310 Varicose veins of unspecified lower extremity with inflammation: Secondary | ICD-10-CM

## 2019-10-25 DIAGNOSIS — Z1283 Encounter for screening for malignant neoplasm of skin: Secondary | ICD-10-CM

## 2019-10-25 NOTE — Progress Notes (Signed)
   Follow-Up Visit   Subjective  Jessica Sullivan is a 74 y.o. female who presents for the following: Annual Exam (Hx of dysplastic nevus - patient has noticed new lesions scattered on the body ). The patient presents for Total-Body Skin Exam (TBSE) for skin cancer screening and mole check.  The following portions of the chart were reviewed this encounter and updated as appropriate:  Tobacco  Allergies  Meds  Problems  Med Hx  Surg Hx  Fam Hx     Review of Systems:  No other skin or systemic complaints except as noted in HPI or Assessment and Plan.  Objective  Well appearing patient in no apparent distress; mood and affect are within normal limits.  A full examination was performed including scalp, head, eyes, ears, nose, lips, neck, chest, axillae, abdomen, back, buttocks, bilateral upper extremities, bilateral lower extremities, hands, feet, fingers, toes, fingernails, and toenails. All findings within normal limits unless otherwise noted below.  Objective  left post med thigh: Scar with no evidence of recurrence.   Objective  Scalp: Thinning of the hair  Assessment & Plan  History of dysplastic nevus left post med thigh  Clear. Observe for recurrence. Call clinic for new or changing lesions.  Recommend regular skin exams, daily broad-spectrum spf 30+ sunscreen use, and photoprotection.     Androgenic alopecia Scalp  Recommend Rogaine for Women QD  Skin cancer screening   Lentigines - Scattered tan macules - Discussed due to sun exposure - Benign, observe - Call for any changes  Seborrheic Keratoses - Stuck-on, waxy, tan-brown papules and plaques  - Discussed benign etiology and prognosis. - Observe - Call for any changes  Melanocytic Nevi - Tan-brown and/or pink-flesh-colored symmetric macules and papules - Benign appearing on exam today - Observation - Call clinic for new or changing moles - Recommend daily use of broad spectrum spf 30+ sunscreen to  sun-exposed areas.   Hemangiomas - Red papules - Discussed benign nature - Observe - Call for any changes  Actinic Damage - diffuse scaly erythematous macules with underlying dyspigmentation - Recommend daily broad spectrum sunscreen SPF 30+ to sun-exposed areas, reapply every 2 hours as needed.  - Call for new or changing lesions.  Varicose Veins - Dilated blue, purple or red veins at the lower extremities - Reassured - These can be treated by sclerotherapy (a procedure to inject a medicine into the veins to make them disappear) if desired, but the treatment is not covered by insurance  Skin cancer screening performed today.  Return in about 1 year (around 10/24/2020) for TBSE.  Luther Redo, CMA, am acting as scribe for Sarina Ser, MD .  Documentation: I have reviewed the above documentation for accuracy and completeness, and I agree with the above.  Sarina Ser, MD

## 2019-11-05 ENCOUNTER — Encounter: Payer: Self-pay | Admitting: Dermatology

## 2019-11-19 DIAGNOSIS — M1712 Unilateral primary osteoarthritis, left knee: Secondary | ICD-10-CM | POA: Diagnosis not present

## 2019-11-25 DIAGNOSIS — R69 Illness, unspecified: Secondary | ICD-10-CM | POA: Diagnosis not present

## 2019-11-27 ENCOUNTER — Encounter (INDEPENDENT_AMBULATORY_CARE_PROVIDER_SITE_OTHER): Payer: Self-pay | Admitting: Vascular Surgery

## 2019-11-28 DIAGNOSIS — M1712 Unilateral primary osteoarthritis, left knee: Secondary | ICD-10-CM | POA: Diagnosis not present

## 2019-12-04 DIAGNOSIS — H2513 Age-related nuclear cataract, bilateral: Secondary | ICD-10-CM | POA: Diagnosis not present

## 2019-12-11 ENCOUNTER — Encounter (INDEPENDENT_AMBULATORY_CARE_PROVIDER_SITE_OTHER): Payer: Self-pay | Admitting: Vascular Surgery

## 2019-12-11 ENCOUNTER — Ambulatory Visit (INDEPENDENT_AMBULATORY_CARE_PROVIDER_SITE_OTHER): Payer: Medicare HMO | Admitting: Vascular Surgery

## 2019-12-11 ENCOUNTER — Other Ambulatory Visit: Payer: Self-pay

## 2019-12-11 VITALS — BP 137/81 | HR 69 | Resp 16 | Ht 62.0 in | Wt 142.6 lb

## 2019-12-11 DIAGNOSIS — I83813 Varicose veins of bilateral lower extremities with pain: Secondary | ICD-10-CM | POA: Diagnosis not present

## 2019-12-11 DIAGNOSIS — E785 Hyperlipidemia, unspecified: Secondary | ICD-10-CM | POA: Diagnosis not present

## 2019-12-11 DIAGNOSIS — M170 Bilateral primary osteoarthritis of knee: Secondary | ICD-10-CM

## 2019-12-11 NOTE — Assessment & Plan Note (Signed)
Had a knee replacement in the right knee and did well.  Needs a knee replacement on the left knee.  Does have a prominent varicosity that will have to be ligated during that procedure.

## 2019-12-11 NOTE — Progress Notes (Signed)
Patient ID: Jessica Sullivan, female   DOB: [birth year], 74 y.o.   MRN: [id number]  Chief Complaint  Patient presents with   New Patient (Initial Visit)    ref Silvio Pate for varicose veins    HPI Jessica Sullivan is a 74 y.o. female.  I am asked to see the patient by Dr. Silvio Pate for evaluation of recurrent symptomatic varicosities.  The patient presents with complaints of symptomatic varicosities of the lower extremities. The patient reports a long standing history of varicosities and they have become painful over time.  I actually saw her for these about 5 or 6 years ago.  She has previously had 2 vein stripping procedures many years ago as well.  There was no clear inciting event or causative factor that started the symptoms.  Both legs are significantly affected.  The patient elevates the legs for relief. The pain is described as stinging and burning and this has gradually increased over many months. The symptoms are generally most severe in the evening, particularly when they have been on their feet for long periods of time.  Longstanding daily use of 20 to 30 mmHg compression stockings has been used to try to improve the symptoms with success initially but worsening symptoms over time success. The patient complains of worsening swelling as an associated symptom. The patient has no previous history of deep venous thrombosis or superficial thrombophlebitis to their knowledge.     Past Medical History:  Diagnosis Date   Chronic venous insufficiency    History of dysplastic nevus 06/07/2013   left post med thigh/moderate   Hyperlipidemia    Lichen sclerosus 56/04/1306   Vulvar biopsy-lichen sclerosus    Osteoarthritis of both knees 03/16/2017    Past Surgical History:  Procedure Laterality Date   CESAREAN SECTION  1978   Chin lift  8/14   DILATION AND CURETTAGE OF UTERUS  1990   year ago- polyp   HAMMER TOE SURGERY  12/13   KNEE ARTHROSCOPY Right 3/13   meniscus   MPJ  Release 2nd toe left     TOTAL KNEE ARTHROPLASTY Right 10/11/2017   Procedure: TOTAL KNEE ARTHROPLASTY;  Surgeon: Hessie Knows, MD;  Location: [facility] ORS;  Service: Orthopedics;  Laterality: Right;   VARICOSE VEIN SURGERY  2004, 2011    Family History  Problem Relation Age of Onset   Dementia Mother    Cancer Father    Cancer Brother        prostate/prostate   Dementia Brother        corticobasilar degeneration   Parkinson's disease Brother    Cancer Brother        prostate   Breast cancer Maternal Aunt    Breast cancer Cousin    Heart disease Neg Hx    Diabetes Neg Hx    Ovarian cancer Neg Hx    Colon cancer Neg Hx      Social History   Tobacco Use   Smoking status: Never Smoker   Smokeless tobacco: Never Used  Vaping Use   Vaping Use: Never used  Substance Use Topics   Alcohol use: Yes    Comment: once a week   Drug use: No     Allergies  Allergen Reactions   Flavoring Agent     Only peach snapps Other reaction(s): Unknown Only peach snapps   Oxycodone Other (See Comments)    Light headed.   Poison Ivy Extract Itching and Rash    Current Outpatient Medications  Medication Sig Dispense Refill   Calcium Carbonate-Vitamin D (CALCIUM + D PO) Take 1 tablet by mouth 2 (two) times daily.      clobetasol ointment (TEMOVATE) 0.05 % APPLY TOPICALLY TWICE A WEEK AS DIRECTED 30 g 0   cyanocobalamin 100 MCG tablet Take 100 mcg by mouth daily.     Glucosamine HCl (GLUCOSAMINE PO) Take 1 tablet by mouth 2 (two) times daily.      Multiple Vitamins-Minerals (CENTRUM SILVER ADULT 50+) TABS Take 1 tablet by mouth daily.     Multiple Vitamins-Minerals (ICAPS AREDS 2) CAPS Take 1 capsule by mouth 2 (two) times daily.     Omega-3 Fatty Acids (FISH OIL) 1000 MG CAPS Take 1 capsule by mouth daily.      Biotin 10 MG CAPS Take 1 tablet by mouth daily.  (Patient not taking: Reported on 10/25/2019)     No current facility-administered medications for this  visit.      REVIEW OF SYSTEMS (Negative unless checked)  Constitutional: [] Weight loss  [] Fever  [] Chills Cardiac: [] Chest pain   [] Chest pressure   [] Palpitations   [] Shortness of breath when laying flat   [] Shortness of breath at rest   [] Shortness of breath with exertion. Vascular:  [] Pain in legs with walking   [] Pain in legs at rest   [] Pain in legs when laying flat   [] Claudication   [] Pain in feet when walking  [] Pain in feet at rest  [] Pain in feet when laying flat   [] History of DVT   [] Phlebitis   [x] Swelling in legs   [x] Varicose veins   [] Non-healing ulcers Pulmonary:   [] Uses home oxygen   [] Productive cough   [] Hemoptysis   [] Wheeze  [] COPD   [] Asthma Neurologic:  [] Dizziness  [] Blackouts   [] Seizures   [] History of stroke   [] History of TIA  [] Aphasia   [] Temporary blindness   [] Dysphagia   [] Weakness or numbness in arms   [] Weakness or numbness in legs Musculoskeletal:  [x] Arthritis   [] Joint swelling   [x] Joint pain   [] Low back pain Hematologic:  [] Easy bruising  [] Easy bleeding   [] Hypercoagulable state   [] Anemic  [] Hepatitis Gastrointestinal:  [] Blood in stool   [] Vomiting blood  [] Gastroesophageal reflux/heartburn   [] Abdominal pain Genitourinary:  [] Chronic kidney disease   [] Difficult urination  [] Frequent urination  [] Burning with urination   [] Hematuria Skin:  [] Rashes   [] Ulcers   [] Wounds Psychological:  [] History of anxiety   []  History of major depression.    Physical Exam BP 137/81 (BP Location: Left Arm)    Pulse 69    Resp 16    Ht 5\' 2"  (1.575 m)    Wt 142 lb 9.6 oz (64.7 kg)    BMI 26.08 kg/m  Gen:  WD/WN, NAD.  Appears younger than stated age Head: [place]/AT, No temporalis wasting.  Ear/Nose/Throat: Hearing grossly intact, dentition good Eyes: Sclera non-icteric. Conjunctiva clear Neck: Supple. Trachea midline Pulmonary:  Good air movement, no use of accessory muscles, respirations not labored.  Cardiac: RRR, No JVD Vascular:  Vessel Right Left  Radial  Palpable Palpable                          PT Palpable Palpable  DP Palpable Palpable   Gastrointestinal: soft, non-tender/non-distended.  Musculoskeletal: M/S 5/5 throughout.  Mild bilateral LE edema.  Fairly extensive varicosities present bilaterally measuring 2 to 3 mm in size at the largest areas. Neurologic: Sensation grossly intact  in extremities.  Symmetrical.  Speech is fluent.  Psychiatric: Judgment intact, Mood & affect appropriate for pt's clinical situation. Dermatologic: No rashes or ulcers noted.  No cellulitis or open wounds.    Radiology No results found.  Labs No results found for this or any previous visit (from the past 2160 hour(s)).  Assessment/Plan:  Hyperlipidemia lipid control important in reducing the progression of atherosclerotic disease.    Osteoarthritis of both knees Had a knee replacement in the right knee and did well.  Needs a knee replacement on the left knee.  Does have a prominent varicosity that will have to be ligated during that procedure.  Varicose veins of leg with pain, bilateral   The patient has symptoms consistent with chronic venous insufficiency. We discussed the natural history and treatment options for venous disease. I recommended the regular use of 20 - 30 mm Hg compression stockings, and she has been wearing these for many years. I recommended leg elevation and anti-inflammatories as needed for pain. I have also recommended a complete venous duplex to assess the venous system for reflux or thrombotic issues. This can be done at the patient's convenience. I will see the patient back after the duplex and determine further treatment options.     Leotis Pain 12/11/2019, 4:56 PM   This note was created with Dragon medical transcription system.  Any errors from dictation are unintentional.

## 2019-12-11 NOTE — Assessment & Plan Note (Addendum)
lipid control important in reducing the progression of atherosclerotic disease.   

## 2019-12-11 NOTE — Patient Instructions (Signed)
Varicose Veins Varicose veins are veins that have become enlarged, bulged, and twisted. They most often appear in the legs. What are the causes? This condition is caused by damage to the valves in the vein. These valves help blood return to your heart. When they are damaged and they stop working properly, blood may flow backward and back up in the veins near the skin, causing the veins to get larger and appear twisted. The condition can result from any issue that causes blood to back up, like pregnancy, prolonged standing, or obesity. What increases the risk? This condition is more likely to develop in people who are:  On their feet a lot.  Pregnant.  Overweight. What are the signs or symptoms? Symptoms of this condition include:  Bulging, twisted, and bluish veins.  A feeling of heaviness. This may be worse at the end of the day.  Leg pain. This may be worse at the end of the day.  Swelling in the leg.  Changes in skin color over the veins. How is this diagnosed? This condition may be diagnosed based on your symptoms, a physical exam, and an ultrasound test. How is this treated? Treatment for this condition may involve:  Avoiding sitting or standing in one position for long periods of time.  Wearing compression stockings. These stockings help to prevent blood clots and reduce swelling in the legs.  Raising (elevating) the legs when resting.  Losing weight.  Exercising regularly. If you have persistent symptoms or want to improve the way your varicose veins look, you may choose to have a procedure to close the varicose veins off or to remove them. Treatments to close off the veins include:  Sclerotherapy. In this treatment, a solution is injected into a vein to close it off.  Laser treatment. In this treatment, the vein is heated with a laser to close it off.  Radiofrequency vein ablation. In this treatment, an electrical current produced by radio waves is used to close  off the vein. Treatments to remove the veins include:  Phlebectomy. In this treatment, the veins are removed through small incisions made over the veins.  Vein ligation and stripping. In this treatment, incisions are made over the veins. The veins are then removed after being tied (ligated) with stitches (sutures). Follow these instructions at home: Activity  Walk as much as possible. Walking increases blood flow. This helps blood return to the heart and takes pressure off your veins. It also increases your cardiovascular strength.  Follow your health care provider's instructions about exercising.  Do not stand or sit in one position for a long period of time.  Do not sit with your legs crossed.  Rest with your legs raised during the day. General instructions   Follow any diet instructions given to you by your health care provider.  Wear compression stockings as directed by your health care provider. Do not wear other kinds of tight clothing around your legs, pelvis, or waist.  Elevate your legs at night to above the level of your heart.  If you get a cut in the skin over the varicose vein and the vein bleeds: ? Lie down with your leg raised. ? Apply firm pressure to the cut with a clean cloth until the bleeding stops. ? Place a bandage (dressing) on the cut. Contact a health care provider if:  The skin around your varicose veins starts to break down.  You have pain, redness, tenderness, or hard swelling over a vein.  You   are uncomfortable because of pain.  You get a cut in the skin over a varicose vein and it will not stop bleeding. Summary  Varicose veins are veins that have become enlarged, bulged, and twisted. They most often appear in the legs.  This condition is caused by damage to the valves in the vein. These valves help blood return to your heart.  Treatment for this condition includes frequent movements, wearing compression stockings, losing weight, and  exercising regularly. In some cases, procedures are done to close off or remove the veins.  Treatment for this condition may include wearing compression stockings, elevating the legs, losing weight, and engaging in regular activity. In some cases, procedures are done to close off or remove the veins. This information is not intended to replace advice given to you by your health care provider. Make sure you discuss any questions you have with your health care provider. Document Revised: 04/20/2018 Document Reviewed: 03/17/2016 Elsevier Patient Education  2020 [employer] Inc[employer]  

## 2019-12-14 ENCOUNTER — Ambulatory Visit (INDEPENDENT_AMBULATORY_CARE_PROVIDER_SITE_OTHER): Payer: Medicare HMO | Admitting: Nurse Practitioner

## 2019-12-14 ENCOUNTER — Ambulatory Visit (INDEPENDENT_AMBULATORY_CARE_PROVIDER_SITE_OTHER): Payer: Medicare HMO

## 2019-12-14 ENCOUNTER — Other Ambulatory Visit: Payer: Self-pay

## 2019-12-14 ENCOUNTER — Encounter (INDEPENDENT_AMBULATORY_CARE_PROVIDER_SITE_OTHER): Payer: Self-pay

## 2019-12-14 DIAGNOSIS — I83813 Varicose veins of bilateral lower extremities with pain: Secondary | ICD-10-CM

## 2019-12-17 ENCOUNTER — Telehealth (INDEPENDENT_AMBULATORY_CARE_PROVIDER_SITE_OTHER): Payer: Self-pay

## 2019-12-17 NOTE — Progress Notes (Signed)
Pt present for annual exam. Pt stated that she was doing well no problems. Pt stated having Cologuard completed in 04/2019.

## 2019-12-17 NOTE — Telephone Encounter (Signed)
Patient called and left a message wanting to know the results of her u/s from 12/14/19. I let the patient know the provider will get back with her once the results are complete.

## 2019-12-18 ENCOUNTER — Encounter: Payer: Self-pay | Admitting: Obstetrics and Gynecology

## 2019-12-18 ENCOUNTER — Ambulatory Visit (INDEPENDENT_AMBULATORY_CARE_PROVIDER_SITE_OTHER): Payer: Medicare HMO | Admitting: Obstetrics and Gynecology

## 2019-12-18 ENCOUNTER — Other Ambulatory Visit: Payer: Self-pay

## 2019-12-18 ENCOUNTER — Encounter: Payer: Medicare HMO | Admitting: Obstetrics and Gynecology

## 2019-12-18 VITALS — BP 136/81 | HR 74 | Ht 62.0 in | Wt 142.1 lb

## 2019-12-18 DIAGNOSIS — Z780 Asymptomatic menopausal state: Secondary | ICD-10-CM

## 2019-12-18 DIAGNOSIS — L900 Lichen sclerosus et atrophicus: Secondary | ICD-10-CM

## 2019-12-18 DIAGNOSIS — Z01419 Encounter for gynecological examination (general) (routine) without abnormal findings: Secondary | ICD-10-CM

## 2019-12-18 MED ORDER — CLOBETASOL PROPIONATE 0.05 % EX OINT: TOPICAL_OINTMENT | CUTANEOUS | 2 refills | Status: DC

## 2019-12-18 NOTE — Progress Notes (Signed)
ANNUAL PREVENTATIVE CARE GYNECOLOGY  ENCOUNTER NOTE  Subjective:       Jessica Sullivan is a 74 y.o. G69P1001 female here for a routine annual gynecologic exam.She is overall well. She has no complaints today. She has stayed healthy throughout Covid. She continues to walk her dogs daily (2 1/2 miles) daily.    Jessica Sullivan has the following concerns today:  1. She notes feeling "under weather" for the past few days after receiving COVID booster shot. Otherwise doing well.     Gynecologic History No LMP recorded. Patient is postmenopausal.  She denies post menopausal bleeding.  Last Pap with HPV: 01/2015, normal. No history of abnormal paps. Last mammogram: 05/29/2019, normal. Last Colonoscopy: 2004, normal. Fecal occult negative 03/2019.  Last Dexa Scan: 08/07/2010   Obstetric History OB History  Gravida Para Term Preterm AB Living  1 1 1     1   SAB TAB Ectopic Multiple Live Births          1    # Outcome Date GA Lbr Len/2nd Weight Sex Delivery Anes PTL Lv  1 Term 1978   8 lb 9.6 oz (3.901 kg) M CS-LVertical   LIV    Past Medical History:  Diagnosis Date  . Chronic venous insufficiency   . History of dysplastic nevus 06/07/2013   left post med thigh/moderate  . Hyperlipidemia   . Lichen sclerosus 24/0/9735   Vulvar biopsy-lichen sclerosus   . Osteoarthritis of both knees 03/16/2017    Family History  Problem Relation Age of Onset  . Dementia Mother   . Cancer Father   . Cancer Brother        prostate/prostate  . Dementia Brother        corticobasilar degeneration  . Parkinson's disease Brother   . Cancer Brother        prostate  . Breast cancer Maternal Aunt   . Breast cancer Cousin   . Heart disease Neg Hx   . Diabetes Neg Hx   . Ovarian cancer Neg Hx   . Colon cancer Neg Hx     Past Surgical History:  Procedure Laterality Date  . [place]  . Chin lift  8/14  . DILATION AND CURETTAGE OF UTERUS  1990   year ago- polyp  . HAMMER TOE SURGERY  12/13    . KNEE ARTHROSCOPY Right 3/13   meniscus  . MPJ Release 2nd toe left    . TOTAL KNEE ARTHROPLASTY Right 10/11/2017   Procedure: TOTAL KNEE ARTHROPLASTY;  Surgeon: Hessie Knows, MD;  Location: [facility] ORS;  Service: Orthopedics;  Laterality: Right;  [place] VARICOSE VEIN SURGERY  2004, 2011    Social History   Socioeconomic History  . Marital status: Married    Spouse name: Not on file  . Number of children: 1  . Years of education: Not on file  . Highest education level: Not on file  Occupational History  . 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 Smoking status: Never Smoker  . Smokeless tobacco: Never Used  Vaping Use  . Vaping Use: Never used  Substance and Sexual Activity  . Alcohol use: Yes    Comment: once a week  . Drug use: No  . Sexual activity: Yes    Birth control/protection: Post-menopausal  Other Topics Concern  . Not on file  Social History Narrative   Has living will   Husband  is health care POA--- son is alternate   Would accept resuscitation attempts but no prolonged artificial ventilation   No tube feeds if cognitively unaware   Social Determinants of Health   Financial Resource Strain:   . Difficulty of Paying Living Expenses: Not on file  Food Insecurity:   . Worried About Charity fundraiser in the Last Year: Not on file  . Ran Out of Food in the Last Year: Not on file  Transportation Needs:   . Lack of Transportation (Medical): Not on file  . Lack of Transportation (Non-Medical): Not on file  Physical Activity:   . Days of Exercise per Week: Not on file  . Minutes of Exercise per Session: Not on file  Stress:   . Feeling of Stress : Not on file  Social Connections:   . Frequency of Communication with Friends and Family: Not on file  . Frequency of Social Gatherings with Friends and Family: Not on file  . Attends Religious Services: Not on file  . Active Member of Clubs or  Organizations: Not on file  . Attends Archivist Meetings: Not on file  . Marital Status: Not on file  Intimate Partner Violence:   . Fear of Current or Ex-Partner: Not on file  . Emotionally Abused: Not on file  . Physically Abused: Not on file  . Sexually Abused: Not on file    Current Outpatient Medications on File Prior to Visit  Medication Sig Dispense Refill  . Calcium Carbonate-Vitamin D (CALCIUM + D PO) Take 1 tablet by mouth 2 (two) times daily.     . clobetasol ointment (TEMOVATE) 0.05 % APPLY TOPICALLY TWICE A WEEK AS DIRECTED 30 g 0  . cyanocobalamin 100 MCG tablet Take 100 mcg by mouth daily.    . Glucosamine HCl (GLUCOSAMINE PO) Take 1 tablet by mouth 2 (two) times daily.     . Multiple Vitamins-Minerals (CENTRUM SILVER ADULT 50+) TABS Take 1 tablet by mouth daily.    . Multiple Vitamins-Minerals (ICAPS AREDS 2) CAPS Take 1 capsule by mouth 2 (two) times daily.    . Omega-3 Fatty Acids (FISH OIL) 1000 MG CAPS Take 1 capsule by mouth daily.      No current facility-administered medications on file prior to visit.    Allergies  Allergen Reactions  . Flavoring Agent     Only peach snapps Other reaction(s): Unknown Only peach snapps  . Oxycodone Other (See Comments)    Light headed.  . Poison Ivy Extract Itching and Rash      Review of Systems ROS Review of Systems - General ROS: negative for - chills, fatigue, fever, hot flashes, night sweats, weight gain or weight loss Psychological ROS: negative for - anxiety, decreased libido, depression, mood swings, physical abuse or sexual abuse Ophthalmic ROS: negative for - blurry vision, eye pain or loss of vision ENT ROS: negative for - headaches, hearing change, visual changes or vocal changes Allergy and Immunology ROS: negative for - hives, itchy/watery eyes or seasonal allergies Hematological and Lymphatic ROS: negative for - bleeding problems, bruising, swollen lymph nodes or weight loss Endocrine ROS:  negative for - galactorrhea, hair pattern changes, hot flashes, malaise/lethargy, mood swings, palpitations, polydipsia/polyuria, skin changes, temperature intolerance or unexpected weight changes Breast ROS: negative for - new or changing breast lumps or nipple discharge Respiratory ROS: negative for - cough or shortness of breath Cardiovascular ROS: negative for - chest pain, irregular heartbeat, palpitations or shortness of breath Gastrointestinal ROS:  no abdominal pain, change in bowel habits, or black or bloody stools Genito-Urinary ROS: no dysuria, trouble voiding, or hematuria.Musculoskeletal ROS: negative for - joint pain or joint stiffness Neurological ROS: negative for - bowel and bladder control changes Dermatological ROS: negative for dry skin, eczema, rash and skin lesion changes    Objective:   General Appearance:    Alert, cooperative, no distress, appears stated age  HEENT:  Grossly normal  Neck:   Supple, symmetrical, trachea midline, no adenopathy; thyroid: no enlargement/tenderness/nodules; no carotid bruit or JVD  Lungs:     Clear to auscultation bilaterally, respirations unlabored   Heart:    Regular rate and rhythm, S1 and S2 normal, no murmur, rub or gallop  Breast Exam:    No tenderness, masses, or nipple abnormality  Abdomen:     Soft, non-tender, bowel sounds active all four quadrants, no masses, no organomegaly.    Genitalia:    Pelvic:external genitalia normal, labia majora slightly erythematous, vagina without lesions, discharge, or tenderness, rectovaginal septum  normal. Cervix normal in appearance, no cervical motion tenderness, no adnexal masses or tenderness.  Uterus normal size, shape, mobile, regular contours, nontender.  Rectal:    Normal external sphincter.  No hemorrhoids appreciated. Internal exam done by MD  Skin:   Skin color, texture, turgor normal.  Neurologic:   Grossly normal     Labs: Lab Results  Component Value Date   WBC 5.0 05/23/2019    HGB 14.3 05/23/2019   HCT 42.5 05/23/2019   MCV 89.2 05/23/2019   PLT 255.0 05/23/2019    Lab Results  Component Value Date   CREATININE 0.70 05/23/2019   BUN 25 (H) 05/23/2019   NA 141 05/23/2019   K 3.6 05/23/2019   CL 104 05/23/2019   CO2 31 05/23/2019    Lab Results  Component Value Date   ALT 19 05/23/2019   AST 15 05/23/2019   ALKPHOS 98 05/23/2019   BILITOT 0.4 05/23/2019    Lab Results  Component Value Date   CHOL 190 03/16/2017   HDL 63.30 03/16/2017   LDLCALC 110 (H) 03/16/2017   TRIG 80.0 03/16/2017   CHOLHDL 3 03/16/2017    No results found for: TSH  No results found for: HGBA1C   Assessment & Plan   1. Lichen sclerosis, continue Temovate 0.05% ointment. She remains asymptomatic without vulvar burning, vulvar itching, or vaginal bleeding or discharge.  2. Contraception: postmenopausal 3. Pap: normal 2016, no further paps needed. 4. Mammogram: normal 03/2017. Continue every other year mammograms.  5. Stool Guaiac/colonoscopy: normal, followed by PCP for this. Last performed 03/2019.  6. Labs: up to date, performed by PCP.  7. Encouraged healthy lifestyle modifications.  8. Recommend continued use of Vitamin D and Calcium for osteoporosis prevention.  9. Flu vaccine received last month. COVID vaccination series completed, including recent booster.  10. Return to [place] or as needed.      Rubie Maid, MD [facility]

## 2019-12-18 NOTE — Patient Instructions (Signed)
Preventive Care 74 Years and Older, Female Preventive care refers to lifestyle choices and visits with your health care provider that can promote health and wellness. This includes:  A yearly physical exam. This is also called an annual well check.  Regular dental and eye exams.  Immunizations.  Screening for certain conditions.  Healthy lifestyle choices, such as diet and exercise. What can I expect for my preventive care visit? Physical exam Your health care provider will check:  Height and weight. These may be used to calculate body mass index (BMI), which is a measurement that tells if you are at a healthy weight.  Heart rate and blood pressure.  Your skin for abnormal spots. Counseling Your health care provider may ask you questions about:  Alcohol, tobacco, and drug use.  Emotional well-being.  Home and relationship well-being.  Sexual activity.  Eating habits.  History of falls.  Memory and ability to understand (cognition).  Work and work Statistician.  Pregnancy and menstrual history. What immunizations do I need?  Influenza (flu) vaccine  This is recommended every year. Tetanus, diphtheria, and pertussis (Tdap) vaccine  You may need a Td booster every 10 years. Varicella (chickenpox) vaccine  You may need this vaccine if you have not already been vaccinated. Zoster (shingles) vaccine  You may need this after age 74. Pneumococcal conjugate (PCV13) vaccine  One dose is recommended after age 74. Pneumococcal polysaccharide (PPSV23) vaccine  One dose is recommended after age 74. Measles, mumps, and rubella (MMR) vaccine  You may need at least one dose of MMR if you were born in 1957 or later. You may also need a second dose. Meningococcal conjugate (MenACWY) vaccine  You may need this if you have certain conditions. Hepatitis A vaccine  You may need this if you have certain conditions or if you travel or work in places where you may be exposed  to hepatitis A. Hepatitis B vaccine  You may need this if you have certain conditions or if you travel or work in places where you may be exposed to hepatitis B. Haemophilus influenzae type b (Hib) vaccine  You may need this if you have certain conditions. You may receive vaccines as individual doses or as more than one vaccine together in one shot (combination vaccines). Talk with your health care provider about the risks and benefits of combination vaccines. What tests do I need? Blood tests  Lipid and cholesterol levels. These may be checked every 5 years, or more frequently depending on your overall health.  Hepatitis C test.  Hepatitis B test. Screening  Lung cancer screening. You may have this screening every year starting at age 74 if you have a 30-pack-year history of smoking and currently smoke or have quit within the past 15 years.  Colorectal cancer screening. All adults should have this screening starting at age 74 and continuing until age 15. Your health care provider may recommend screening at age 74 if you are at increased risk. You will have tests every 1-10 years, depending on your results and the type of screening test.  Diabetes screening. This is done by checking your blood sugar (glucose) after you have not eaten for a while (fasting). You may have this done every 1-3 years.  Mammogram. This may be done every 1-2 years. Talk with your health care provider about how often you should have regular mammograms.  BRCA-related cancer screening. This may be done if you have a family history of breast, ovarian, tubal, or peritoneal cancers.  Other tests  Sexually transmitted disease (STD) testing.  Bone density scan. This is done to screen for osteoporosis. You may have this done starting at age 74. Follow these instructions at home: Eating and drinking  Eat a diet that includes fresh fruits and vegetables, whole grains, lean protein, and low-fat dairy products. Limit  your intake of foods with high amounts of sugar, saturated fats, and salt.  Take vitamin and mineral supplements as recommended by your health care provider.  Do not drink alcohol if your health care provider tells you not to drink.  If you drink alcohol: ? Limit how much you have to 0-1 drink a day. ? Be aware of how much alcohol is in your drink. In the [place] one drink equals one 12 oz bottle of beer (355 mL), one 5 oz glass of wine (148 mL), or one 1 oz glass of hard liquor (44 mL). Lifestyle  Take daily care of your teeth and gums.  Stay active. Exercise for at least 30 minutes on 5 or more days each week.  Do not use any products that contain nicotine or tobacco, such as cigarettes, e-cigarettes, and chewing tobacco. If you need help quitting, ask your health care provider.  If you are sexually active, practice safe sex. Use a condom or other form of protection in order to prevent STIs (sexually transmitted infections).  Talk with your health care provider about taking a low-dose aspirin or statin. What's next?  Go to your health care provider once a year for a well check visit.  Ask your health care provider how often you should have your eyes and teeth checked.  Stay up to date on all vaccines. This information is not intended to replace advice given to you by your health care provider. Make sure you discuss any questions you have with your health care provider. Document Revised: 02/16/2018 Document Reviewed: 02/16/2018 Elsevier Patient Education  2020 [employer] Inc[employer]    Breast Self-Awareness Breast self-awareness means being familiar with how your breasts look and feel. It involves checking your breasts regularly and reporting any changes to your health care provider. Practicing breast self-awareness is important. Sometimes changes may not be harmful (are benign), but sometimes a change in your breasts can be a sign of a serious medical problem. It is important to learn how  to do this procedure correctly so that you can catch problems early, when treatment is more likely to be successful. All women should practice breast self-awareness, including women who have had breast implants. What you need:  A mirror.  A well-lit room. How to do a breast self-exam A breast self-exam is one way to learn what is normal for your breasts and whether your breasts are changing. To do a breast self-exam: Look for changes  1. Remove all the clothing above your waist. 2. Stand in front of a mirror in a room with good lighting. 3. Put your hands on your hips. 4. Push your hands firmly downward. 5. Compare your breasts in the mirror. Look for differences between them (asymmetry), such as: ? Differences in shape. ? Differences in size. ? Puckers, dips, and bumps in one breast and not the other. 6. Look at each breast for changes in the skin, such as: ? Redness. ? Scaly areas. 7. Look for changes in your nipples, such as: ? Discharge. ? Bleeding. ? Dimpling. ? Redness. ? A change in position. Feel for changes Carefully feel your breasts for lumps and changes. It is best   to do this while lying on your back on the floor, and again while sitting or standing in the tub or shower with soapy water on your skin. Feel each breast in the following way: 1. Place the arm on the side of the breast you are examining above your head. 2. Feel your breast with the other hand. 3. Start in the nipple area and make -inch (2 cm) overlapping circles to feel your breast. Use the pads of your three middle fingers to do this. Apply light pressure, then medium pressure, then firm pressure. The light pressure will allow you to feel the tissue closest to the skin. The medium pressure will allow you to feel the tissue that is a little deeper. The firm pressure will allow you to feel the tissue close to the ribs. 4. Continue the overlapping circles, moving downward over the breast until you feel your ribs  below your breast. 5. Move one finger-width toward the center of the body. Continue to use the -inch (2 cm) overlapping circles to feel your breast as you move slowly up toward your collarbone. 6. Continue the up-and-down exam using all three pressures until you reach your armpit.  Write down what you find Writing down what you find can help you remember what to discuss with your health care provider. Write down:  What is normal for each breast.  Any changes that you find in each breast, including: ? The kind of changes you find. ? Any pain or tenderness. ? Size and location of any lumps.  Where you are in your menstrual cycle, if you are still menstruating. General tips and recommendations  Examine your breasts every month.  If you are breastfeeding, the best time to examine your breasts is after a feeding or after using a breast pump.  If you menstruate, the best time to examine your breasts is 5-7 days after your period. Breasts are generally lumpier during menstrual periods, and it may be more difficult to notice changes.  With time and practice, you will become more familiar with the variations in your breasts and more comfortable with the exam. Contact a health care provider if you:  See a change in the shape or size of your breasts or nipples.  See a change in the skin of your breast or nipples, such as a reddened or scaly area.  Have unusual discharge from your nipples.  Find a lump or thick area that was not there before.  Have pain in your breasts.  Have any concerns related to your breast health. Summary  Breast self-awareness includes looking for physical changes in your breasts, as well as feeling for any changes within your breasts.  Breast self-awareness should be performed in front of a mirror in a well-lit room.  You should examine your breasts every month. If you menstruate, the best time to examine your breasts is 5-7 days after your menstrual  period.  Let your health care provider know of any changes you notice in your breasts, including changes in size, changes on the skin, pain or tenderness, or unusual fluid from your nipples. This information is not intended to replace advice given to you by your health care provider. Make sure you discuss any questions you have with your health care provider. Document Revised: 10/11/2017 Document Reviewed: 10/11/2017 Elsevier Patient Education  2020 Elsevier Inc.  

## 2019-12-21 ENCOUNTER — Encounter (INDEPENDENT_AMBULATORY_CARE_PROVIDER_SITE_OTHER): Payer: Self-pay | Admitting: Vascular Surgery

## 2019-12-25 ENCOUNTER — Telehealth (INDEPENDENT_AMBULATORY_CARE_PROVIDER_SITE_OTHER): Payer: Self-pay | Admitting: Nurse Practitioner

## 2019-12-25 NOTE — Telephone Encounter (Signed)
Attempted to contact patient regarding non invasive studies.  Spoke with husband.  Also attempted cell phone.  Will call back to reach patient

## 2020-02-04 DIAGNOSIS — R69 Illness, unspecified: Secondary | ICD-10-CM | POA: Diagnosis not present

## 2020-02-05 ENCOUNTER — Other Ambulatory Visit: Payer: Self-pay | Admitting: Orthopedic Surgery

## 2020-02-05 DIAGNOSIS — M1712 Unilateral primary osteoarthritis, left knee: Secondary | ICD-10-CM

## 2020-02-07 ENCOUNTER — Telehealth: Payer: Self-pay | Admitting: Internal Medicine

## 2020-02-07 NOTE — Telephone Encounter (Signed)
Error

## 2020-02-08 ENCOUNTER — Other Ambulatory Visit: Payer: Self-pay

## 2020-02-08 ENCOUNTER — Ambulatory Visit
Admission: RE | Admit: 2020-02-08 | Discharge: 2020-02-08 | Disposition: A | Discharge status: Home / Self Care | Payer: Medicare HMO | Source: Ambulatory Visit | Attending: Orthopedic Surgery | Admitting: Orthopedic Surgery

## 2020-02-08 DIAGNOSIS — M1712 Unilateral primary osteoarthritis, left knee: Secondary | ICD-10-CM | POA: Diagnosis not present

## 2020-02-08 DIAGNOSIS — Z01818 Encounter for other preprocedural examination: Secondary | ICD-10-CM | POA: Diagnosis not present

## 2020-02-11 ENCOUNTER — Telehealth (INDEPENDENT_AMBULATORY_CARE_PROVIDER_SITE_OTHER): Payer: Self-pay | Admitting: Nurse Practitioner

## 2020-02-11 ENCOUNTER — Telehealth (INDEPENDENT_AMBULATORY_CARE_PROVIDER_SITE_OTHER): Payer: Self-pay

## 2020-02-11 NOTE — Telephone Encounter (Signed)
She should have a Right Foam Schlero

## 2020-02-11 NOTE — Telephone Encounter (Signed)
The patient returns to the office for followup status post laser ablation of the bilateral great saphenous veins. The patient notes multiple residual varicosities bilaterally which continued to hurt with dependent positions and remained tender to palpation. The patient's swelling is unchanged from preoperative status. The patient continues to wear graduated compression stockings on a daily basis but these are not eliminating the pain and discomfort. The patient continues to use over-the-counter anti-inflammatory medications to treat the pain and related symptoms but this has not given the patient relief. The patient notes the pain in the lower extremities is causing problems with daily exercise, problems at work and even with household activities such as preparing meals and doing dishes.  The patient is otherwise done well and there have been no complications related to the laser procedure or interval changes in the patient's overall   Today noninvasive studies show no evidence of DVT or superficial thrombophlebitis seen bilaterally.  The right lower extremity has evidence of deep venous insufficiency as well as reflux present in the great saphenous vein at the saphenofemoral junction extending to the proximal thigh.  There is also an accessory vein present with reflux in the right lower extremity.  The left lower extremity has reflux in the great saphenous vein at the saphenofemoral junction.  Recommend:  The patient has had successful ablation of the previously incompetent saphenous venous system but still has persistent symptoms of pain and swelling that are having a negative impact on daily life and daily activities.  Patient should undergo injection foam sclerotherapy to treat the residual varicosities of the right lower extremity.  The risks, benefits and alternative therapies were reviewed in detail with the patient.  All questions were answered.  The patient agrees to proceed with sclerotherapy  at their convenience.  The patient will continue wearing the graduated compression stockings and using the over-the-counter pain medications to treat her symptoms.

## 2020-02-27 ENCOUNTER — Ambulatory Visit: Payer: Medicare HMO | Admitting: Dermatology

## 2020-02-27 ENCOUNTER — Other Ambulatory Visit: Payer: Self-pay

## 2020-02-27 DIAGNOSIS — D3610 Benign neoplasm of peripheral nerves and autonomic nervous system, unspecified: Secondary | ICD-10-CM

## 2020-02-27 DIAGNOSIS — D3612 Benign neoplasm of peripheral nerves and autonomic nervous system, upper limb, including shoulder: Secondary | ICD-10-CM | POA: Diagnosis not present

## 2020-02-27 NOTE — Progress Notes (Signed)
   Follow-Up Visit   Subjective  Jessica Sullivan is a 74 y.o. female who presents for the following: Other (Spot of left arm that just popped up but it looks similar to a BCC that her husband had).  The following portions of the chart were reviewed this encounter and updated as appropriate:   Tobacco  Allergies  Meds  Problems  Med Hx  Surg Hx  Fam Hx     Review of Systems:  No other skin or systemic complaints except as noted in HPI or Assessment and Plan.  Objective  Well appearing patient in no apparent distress; mood and affect are within normal limits.  A focused examination was performed including left upper arm. Relevant physical exam findings are noted in the Assessment and Plan.  Objective  Left distal ant deltoid: Well healed scar   Assessment & Plan  Scar at site of biopsy-proven neurofibroma removal in May 2019 Left distal ant deltoid Biopsy proven Neurofibroma (07/2017) - Benign. Benign-appearing.  Observation.  Call clinic for new or changing lesions.  Recommend daily use of broad spectrum spf 30+ sunscreen to sun-exposed areas.   Return for Follow up as scheduled.  I, Ashok Cordia, CMA, am acting as scribe for Sarina Ser, MD .  Documentation: I have reviewed the above documentation for accuracy and completeness, and I agree with the above.  Sarina Ser, MD

## 2020-03-06 ENCOUNTER — Encounter: Payer: Self-pay | Admitting: Dermatology

## 2020-03-19 ENCOUNTER — Other Ambulatory Visit: Payer: Self-pay | Admitting: Orthopedic Surgery

## 2020-03-24 ENCOUNTER — Other Ambulatory Visit: Payer: Self-pay

## 2020-03-24 ENCOUNTER — Other Ambulatory Visit
Admission: RE | Admit: 2020-03-24 | Discharge: 2020-03-24 | Disposition: A | Discharge status: Home / Self Care | Payer: Medicare HMO | Source: Ambulatory Visit | Attending: Orthopedic Surgery | Admitting: Orthopedic Surgery

## 2020-03-24 NOTE — Patient Instructions (Signed)
Your procedure is scheduled on:  Report to Day Surgery. To find out your arrival time please call [phone] between 1PM - 3PM on .  Remember: Instructions that are not followed completely may result in serious medical risk,  up to and including death, or upon the discretion of your surgeon and anesthesiologist your  surgery may need to be rescheduled.     _X__ 1. Do not eat food after midnight the night before your procedure.                 No chewing gum or hard candies. You may drink clear liquids up to 2 hours                 before you are scheduled to arrive for your surgery- DO not drink clear                 liquids within 2 hours of the start of your surgery.                 Clear Liquids include:  water, apple juice without pulp, clear Gatorade, G2 or                  Gatorade Zero (avoid Red/Purple/Blue), Black Coffee or Tea (Do not add                 anything to coffee or tea).  __X__3.   Complete the "Ensure Clear Pre-surgery Clear Carbohydrate Drink" provided to you, 2 hours before arrival.   __X__2.  On the morning of surgery brush your teeth with toothpaste and water, you                may rinse your mouth with mouthwash if you wish.  Do not swallow any toothpaste of mouthwash.     _X__ 3.  No Alcohol for 24 hours before or after surgery.   _X__ 4.  Do Not Smoke or use e-cigarettes For 24 Hours Prior to Your Surgery.                 Do not use any chewable tobacco products for at least 6 hours prior to                 Surgery.  _X__  5.  Do not use any recreational drugs (marijuana, cocaine, heroin, ecstasy, MDMA or other)                For at least one week prior to your surgery.  Combination of these drugs with anesthesia                May have life threatening results.  ____  7.  Notify your doctor if there is any change in your medical condition      (cold, fever, infections).     Do not wear jewelry, make-up, hairpins, clips or  nail polish. Do not wear lotions, powders, or perfumes. You may wear deodorant. Do not shave 48 hours prior to surgery. Men may shave face and neck. Do not bring valuables to the hospital.    [facility] is not responsible for any belongings or valuables.  Contacts, dentures or bridgework may not be worn into surgery. Leave your suitcase in the car. After surgery it may be brought to your room. For patients admitted to the hospital, discharge time is determined by your treatment team.   Patients discharged the day of surgery will not be allowed to  drive home.   Make arrangements for someone to be with you for the first 24 hours of your Same Day Discharge.   __X__ Take these medicines the morning of surgery with A SIP OF WATER:    1. None     ____ Fleet Enema (as directed)   __x__ Use CHG Soap (or wipes) as directed  ____ Use Benzoyl Peroxide Gel as instructed  ____ Use inhalers on the day of surgery  ____ Stop metformin 2 days prior to surgery    ____ Take 1/2 of usual insulin dose the night before surgery. No insulin the morning          of surgery.   ____ Stop Anti-inflammatories such as Ibuprofen, Aleve, Advil, naproxen, aspirin and or BC powders.    ____ Stop supplements until after surgery.    ____ Bring C-Pap to the hospital.    If you have any questions regarding your pre-procedure instructions,  Please call Pre-admit Testing at [phone].

## 2020-03-24 NOTE — Patient Instructions (Signed)
Your procedure is scheduled on: Tuesday April 01, 2020. Report to Day Surgery [facility] (stop by Registration desk first, before going upstairs). To find out your arrival time please call [phone] between 1PM - 3PM on Monday March 31, 2020.  Remember: Instructions that are not followed completely may result in serious medical risk,  up to and including death, or upon the discretion of your surgeon and anesthesiologist your  surgery may need to be rescheduled.     _X__ 1. Do not eat food after midnight the night before your procedure.                 No chewing gum or hard candies. You may drink clear liquids up to 2 hours                 before you are scheduled to arrive for your surgery- DO not drink clear                 liquids within 2 hours of the start of your surgery.                 Clear Liquids include:  water, apple juice without pulp, clear Gatorade, G2 or                  Gatorade Zero (avoid Red/Purple/Blue), Black Coffee or Tea (Do not add                 anything to coffee or tea). __X__2.   Complete the "Ensure Clear Pre-surgery Clear Carbohydrate Drink" provided to you, 2 hours before arrival.  __X__3.  On the morning of surgery brush your teeth with toothpaste and water, you                may rinse your mouth with mouthwash if you wish.  Do not swallow any toothpaste of mouthwash.     _X__ 4.  No Alcohol for 24 hours before or after surgery.   _X__ 5.  Do Not Smoke or use e-cigarettes For 24 Hours Prior to Your Surgery.                 Do not use any chewable tobacco products for at least 6 hours prior to                 Surgery.  _X__  6.  Do not use any recreational drugs (marijuana, cocaine, heroin, ecstasy, MDMA or other)                For at least one week prior to your surgery.  Combination of these drugs with anesthesia                May have life threatening results.  __X__7.  Notify your doctor if there  is any change in your medical condition      (cold, fever, infections).     Do not wear jewelry, make-up, hairpins, clips or nail polish. Do not wear lotions, powders, or perfumes. You may wear deodorant. Do not shave 48 hours prior to surgery. Men may shave face and neck. Do not bring valuables to the hospital.    [facility] is not responsible for any belongings or valuables.  Contacts, dentures or bridgework may not be worn into surgery. Leave your suitcase in the car. After surgery it may be brought to your room. For patients admitted to the hospital, discharge time is determined by your  treatment team.   Patients discharged the day of surgery will not be allowed to drive home.   Make arrangements for someone to be with you for the first 24 hours of your Same Day Discharge.   __x__ Take these medicines the morning of surgery with A SIP OF WATER:    1. None    ____ Fleet Enema (as directed)   __X__ Use CHG Soap (or wipes) as directed  ____ Use Benzoyl Peroxide Gel as instructed  ____ Use inhalers on the day of surgery  ____ Stop metformin 2 days prior to surgery    __x__ Stop Anti-inflammatories such as Ibuprofen, Aleve, Advil, naproxen, aspirin and or BC powders.    __x__ Stop supplements until after surgery.  Glucosamine HCl and Omega-3 Fatty Acids (FISH OIL)   __x__ Do not start any herbal supplements before your procedure.    If you have any questions regarding your pre-procedure instructions,  Please call Pre-admit Testing at [phone].

## 2020-03-25 ENCOUNTER — Other Ambulatory Visit: Payer: Self-pay

## 2020-03-25 ENCOUNTER — Encounter
Admission: RE | Admit: 2020-03-25 | Discharge: 2020-03-25 | Disposition: A | Discharge status: Home / Self Care | Payer: Medicare HMO | Source: Ambulatory Visit | Attending: Orthopedic Surgery | Admitting: Orthopedic Surgery

## 2020-03-25 DIAGNOSIS — Z01818 Encounter for other preprocedural examination: Secondary | ICD-10-CM | POA: Diagnosis not present

## 2020-03-25 DIAGNOSIS — Z01810 Encounter for preprocedural cardiovascular examination: Secondary | ICD-10-CM | POA: Diagnosis not present

## 2020-03-25 LAB — COMPREHENSIVE METABOLIC PANEL
ALT: 23 U/L (ref 0–44)
AST: 17 U/L (ref 15–41)
Albumin: 4.1 g/dL (ref 3.5–5.0)
Alkaline Phosphatase: 94 U/L (ref 38–126)
Anion gap: 10 (ref 5–15)
BUN: 23 mg/dL (ref 8–23)
CO2: 27 mmol/L (ref 22–32)
Calcium: 9.3 mg/dL (ref 8.9–10.3)
Chloride: 103 mmol/L (ref 98–111)
Creatinine, Ser: 0.66 mg/dL (ref 0.44–1.00)
GFR, Estimated: >60 mL/min (ref >60)
Glucose, Bld: 97 mg/dL (ref 70–99)
Potassium: 3.7 mmol/L (ref 3.5–5.1)
Sodium: 140 mmol/L (ref 135–145)
Total Bilirubin: 0.6 mg/dL (ref 0.3–1.2)
Total Protein: 7.2 g/dL (ref 6.5–8.1)

## 2020-03-25 LAB — SURGICAL PCR SCREEN
MRSA, PCR: NEGATIVE
Staphylococcus aureus: NEGATIVE

## 2020-03-25 LAB — CBC WITH DIFFERENTIAL/PLATELET
Abs Immature Granulocytes: 0.03 10*3/uL (ref 0.00–0.07)
Basophils Absolute: 0.1 10*3/uL (ref 0.0–0.1)
Basophils Relative: 1 %
Eosinophils Absolute: 0 10*3/uL (ref 0.0–0.5)
Eosinophils Relative: 1 %
HCT: 42.3 % (ref 36.0–46.0)
Hemoglobin: 14 g/dL (ref 12.0–15.0)
Immature Granulocytes: 1 %
Lymphocytes Relative: 30 %
Lymphs Abs: 2 10*3/uL (ref 0.7–4.0)
MCH: 28.9 pg (ref 26.0–34.0)
MCHC: 33.1 g/dL (ref 30.0–36.0)
MCV: 87.4 fL (ref 80.0–100.0)
Monocytes Absolute: 0.5 10*3/uL (ref 0.1–1.0)
Monocytes Relative: 7 %
Neutro Abs: 4 10*3/uL (ref 1.7–7.7)
Neutrophils Relative %: 60 %
Platelets: 248 10*3/uL (ref 150–400)
RBC: 4.84 MIL/uL (ref 3.87–5.11)
RDW: 13.2 % (ref 11.5–15.5)
WBC: 6.6 10*3/uL (ref 4.0–10.5)
nRBC: 0 % (ref 0.0–0.2)

## 2020-03-25 LAB — URINALYSIS, ROUTINE W REFLEX MICROSCOPIC
Bilirubin Urine: NEGATIVE
Glucose, UA: NEGATIVE mg/dL
Hgb urine dipstick: NEGATIVE
Ketones, ur: 5 mg/dL — AB
Leukocytes,Ua: NEGATIVE
Nitrite: NEGATIVE
Protein, ur: NEGATIVE mg/dL
Specific Gravity, Urine: 1.023 (ref 1.005–1.030)
pH: 5 (ref 5.0–8.0)

## 2020-03-25 LAB — TYPE AND SCREEN
ABO/RH(D): B POS
Antibody Screen: NEGATIVE

## 2020-03-28 ENCOUNTER — Other Ambulatory Visit: Payer: Self-pay

## 2020-03-28 ENCOUNTER — Other Ambulatory Visit
Admission: RE | Admit: 2020-03-28 | Discharge: 2020-03-28 | Disposition: A | Discharge status: Home / Self Care | Payer: Medicare HMO | Source: Ambulatory Visit | Attending: Orthopedic Surgery | Admitting: Orthopedic Surgery

## 2020-03-28 DIAGNOSIS — Z20822 Contact with and (suspected) exposure to covid-19: Secondary | ICD-10-CM | POA: Diagnosis not present

## 2020-03-28 DIAGNOSIS — Z01812 Encounter for preprocedural laboratory examination: Secondary | ICD-10-CM | POA: Insufficient documentation

## 2020-03-28 LAB — SARS CORONAVIRUS 2 (TAT 6-24 HRS): SARS Coronavirus 2: NEGATIVE

## 2020-04-01 ENCOUNTER — Ambulatory Visit: Payer: Medicare HMO

## 2020-04-01 ENCOUNTER — Encounter
Admission: RE | Disposition: A | Discharge status: Home / Self Care | Payer: Self-pay | Source: Home / Self Care | Attending: Orthopedic Surgery

## 2020-04-01 ENCOUNTER — Encounter: Payer: Self-pay | Admitting: Orthopedic Surgery

## 2020-04-01 ENCOUNTER — Other Ambulatory Visit: Payer: Self-pay

## 2020-04-01 ENCOUNTER — Ambulatory Visit
Admission: RE | Admit: 2020-04-01 | Discharge: 2020-04-01 | Disposition: A | Discharge status: Home / Self Care | Payer: Medicare HMO | Attending: Orthopedic Surgery | Admitting: Orthopedic Surgery

## 2020-04-01 DIAGNOSIS — Z9102 Food additives allergy status: Secondary | ICD-10-CM | POA: Insufficient documentation

## 2020-04-01 DIAGNOSIS — M1712 Unilateral primary osteoarthritis, left knee: Secondary | ICD-10-CM | POA: Diagnosis not present

## 2020-04-01 DIAGNOSIS — M21162 Varus deformity, not elsewhere classified, left knee: Secondary | ICD-10-CM | POA: Insufficient documentation

## 2020-04-01 DIAGNOSIS — Z96651 Presence of right artificial knee joint: Secondary | ICD-10-CM | POA: Insufficient documentation

## 2020-04-01 DIAGNOSIS — Z885 Allergy status to narcotic agent status: Secondary | ICD-10-CM | POA: Insufficient documentation

## 2020-04-01 DIAGNOSIS — G8918 Other acute postprocedural pain: Secondary | ICD-10-CM

## 2020-04-01 DIAGNOSIS — Z96652 Presence of left artificial knee joint: Secondary | ICD-10-CM | POA: Diagnosis not present

## 2020-04-01 DIAGNOSIS — Z471 Aftercare following joint replacement surgery: Secondary | ICD-10-CM | POA: Diagnosis not present

## 2020-04-01 HISTORY — PX: TOTAL KNEE ARTHROPLASTY: SHX125

## 2020-04-01 SURGERY — ARTHROPLASTY, KNEE, TOTAL
Anesthesia: Spinal | Site: Knee | Laterality: Left

## 2020-04-01 MED ORDER — METOCLOPRAMIDE HCL 10 MG PO TABS: 5.0000 mg | ORAL_TABLET | Freq: Three times a day (TID) | ORAL | Status: DC | PRN

## 2020-04-01 MED ORDER — METHOCARBAMOL 500 MG PO TABS
ORAL_TABLET | ORAL | Status: AC
  Administered 2020-04-01: 500 mg via ORAL
  Filled 2020-04-01: qty 1

## 2020-04-01 MED ORDER — PHENOL 1.4 % MT LIQD
1.0000 | OROMUCOSAL | Status: DC | PRN
  Filled 2020-04-01: qty 177

## 2020-04-01 MED ORDER — BUPIVACAINE-EPINEPHRINE (PF) 0.25% -1:200000 IJ SOLN: INTRAMUSCULAR | Status: DC | PRN

## 2020-04-01 MED ORDER — HYDROCODONE-ACETAMINOPHEN 5-325 MG PO TABS: 1.0000 | ORAL_TABLET | ORAL | Status: DC | PRN

## 2020-04-01 MED ORDER — CEFAZOLIN SODIUM-DEXTROSE 2-4 GM/100ML-% IV SOLN
INTRAVENOUS | Status: AC
  Administered 2020-04-01: 2 g via INTRAVENOUS
  Filled 2020-04-01: qty 100

## 2020-04-01 MED ORDER — MORPHINE SULFATE (PF) 2 MG/ML IV SOLN: 0.5000 mg | INTRAVENOUS | Status: DC | PRN

## 2020-04-01 MED ORDER — FENTANYL CITRATE (PF) 100 MCG/2ML IJ SOLN: 25.0000 ug | INTRAMUSCULAR | Status: DC | PRN

## 2020-04-01 MED ORDER — DOCUSATE SODIUM 100 MG PO CAPS
100.0000 mg | ORAL_CAPSULE | Freq: Two times a day (BID) | ORAL | Status: DC
  Administered 2020-04-01: 100 mg via ORAL
  Filled 2020-04-01: qty 1

## 2020-04-01 MED ORDER — PHENYLEPHRINE HCL (PRESSORS) 10 MG/ML IV SOLN
INTRAVENOUS | Status: AC
  Filled 2020-04-01: qty 1

## 2020-04-01 MED ORDER — FAMOTIDINE 20 MG PO TABS: 20.0000 mg | ORAL_TABLET | Freq: Once | ORAL | Status: AC

## 2020-04-01 MED ORDER — SODIUM CHLORIDE FLUSH 0.9 % IV SOLN
INTRAVENOUS | Status: AC
  Filled 2020-04-01: qty 40

## 2020-04-01 MED ORDER — TRANEXAMIC ACID-NACL 1000-0.7 MG/100ML-% IV SOLN
INTRAVENOUS | Status: DC | PRN
  Administered 2020-04-01: 1000 mg via INTRAVENOUS

## 2020-04-01 MED ORDER — SODIUM CHLORIDE 0.9 % IV SOLN: INTRAVENOUS | Status: DC

## 2020-04-01 MED ORDER — NEOMYCIN-POLYMYXIN B GU 40-200000 IR SOLN
Status: DC | PRN
  Administered 2020-04-01: 16 mL

## 2020-04-01 MED ORDER — TRANEXAMIC ACID-NACL 1000-0.7 MG/100ML-% IV SOLN
INTRAVENOUS | Status: AC
  Filled 2020-04-01: qty 100

## 2020-04-01 MED ORDER — BUPIVACAINE-EPINEPHRINE (PF) 0.25% -1:200000 IJ SOLN
INTRAMUSCULAR | Status: AC
  Filled 2020-04-01: qty 30

## 2020-04-01 MED ORDER — CHLORHEXIDINE GLUCONATE 0.12 % MT SOLN: 15.0000 mL | Freq: Once | OROMUCOSAL | Status: AC

## 2020-04-01 MED ORDER — TRAMADOL HCL 50 MG PO TABS: 50.0000 mg | ORAL_TABLET | Freq: Four times a day (QID) | ORAL | Status: DC

## 2020-04-01 MED ORDER — SODIUM CHLORIDE 0.9 % IV SOLN
INTRAVENOUS | Status: DC | PRN
  Administered 2020-04-01: 10 ug/min via INTRAVENOUS

## 2020-04-01 MED ORDER — ACETAMINOPHEN 325 MG PO TABS: 325.0000 mg | ORAL_TABLET | Freq: Four times a day (QID) | ORAL | Status: DC | PRN

## 2020-04-01 MED ORDER — FENTANYL CITRATE (PF) 100 MCG/2ML IJ SOLN
INTRAMUSCULAR | Status: DC | PRN
  Administered 2020-04-01 (×2): 50 ug via INTRAVENOUS

## 2020-04-01 MED ORDER — ENOXAPARIN SODIUM 30 MG/0.3ML ~~LOC~~ SOLN
30.0000 mg | Freq: Two times a day (BID) | SUBCUTANEOUS | Status: DC
  Filled 2020-04-01: qty 0.3

## 2020-04-01 MED ORDER — SODIUM CHLORIDE 0.9 % IV SOLN
INTRAVENOUS | Status: DC | PRN
  Administered 2020-04-01: 90 mL

## 2020-04-01 MED ORDER — CEFAZOLIN SODIUM-DEXTROSE 2-4 GM/100ML-% IV SOLN
INTRAVENOUS | Status: AC
  Filled 2020-04-01: qty 100

## 2020-04-01 MED ORDER — MENTHOL 3 MG MT LOZG
1.0000 | LOZENGE | OROMUCOSAL | Status: DC | PRN
  Filled 2020-04-01: qty 9

## 2020-04-01 MED ORDER — LACTATED RINGERS IV SOLN: INTRAVENOUS | Status: DC

## 2020-04-01 MED ORDER — FAMOTIDINE 20 MG PO TABS
ORAL_TABLET | ORAL | Status: AC
  Administered 2020-04-01: 20 mg via ORAL
  Filled 2020-04-01: qty 1

## 2020-04-01 MED ORDER — LACTATED RINGERS IV BOLUS: 250.0000 mL | Freq: Once | INTRAVENOUS | Status: DC

## 2020-04-01 MED ORDER — MORPHINE SULFATE (PF) 10 MG/ML IV SOLN
INTRAVENOUS | Status: AC
  Filled 2020-04-01: qty 1

## 2020-04-01 MED ORDER — ONDANSETRON HCL 4 MG/2ML IJ SOLN: 4.0000 mg | Freq: Four times a day (QID) | INTRAMUSCULAR | Status: DC | PRN

## 2020-04-01 MED ORDER — MIDAZOLAM HCL 2 MG/2ML IJ SOLN
INTRAMUSCULAR | Status: AC
  Filled 2020-04-01: qty 2

## 2020-04-01 MED ORDER — PROPOFOL 10 MG/ML IV BOLUS
INTRAVENOUS | Status: AC
  Filled 2020-04-01: qty 20

## 2020-04-01 MED ORDER — HYDROCODONE-ACETAMINOPHEN 7.5-325 MG PO TABS
ORAL_TABLET | ORAL | Status: AC
  Filled 2020-04-01: qty 1

## 2020-04-01 MED ORDER — FENTANYL CITRATE (PF) 100 MCG/2ML IJ SOLN
INTRAMUSCULAR | Status: AC
  Filled 2020-04-01: qty 2

## 2020-04-01 MED ORDER — METOCLOPRAMIDE HCL 5 MG/ML IJ SOLN: 5.0000 mg | Freq: Three times a day (TID) | INTRAMUSCULAR | Status: DC | PRN

## 2020-04-01 MED ORDER — MORPHINE SULFATE (PF) 10 MG/ML IV SOLN
INTRAVENOUS | Status: DC | PRN
  Administered 2020-04-01: 10 mg

## 2020-04-01 MED ORDER — ONDANSETRON HCL 4 MG/2ML IJ SOLN: 4.0000 mg | Freq: Once | INTRAMUSCULAR | Status: DC | PRN

## 2020-04-01 MED ORDER — BUPIVACAINE LIPOSOME 1.3 % IJ SUSP
INTRAMUSCULAR | Status: AC
  Filled 2020-04-01: qty 20

## 2020-04-01 MED ORDER — ENOXAPARIN SODIUM 40 MG/0.4ML ~~LOC~~ SOLN
40.0000 mg | SUBCUTANEOUS | 0 refills | Status: DC
Start: 2020-04-01 — End: 2020-05-23

## 2020-04-01 MED ORDER — CEFAZOLIN SODIUM-DEXTROSE 2-4 GM/100ML-% IV SOLN
2.0000 g | INTRAVENOUS | Status: AC
  Administered 2020-04-01: 2 g via INTRAVENOUS

## 2020-04-01 MED ORDER — PROPOFOL 10 MG/ML IV BOLUS
INTRAVENOUS | Status: DC | PRN
  Administered 2020-04-01: 50 mg via INTRAVENOUS

## 2020-04-01 MED ORDER — PROPOFOL 500 MG/50ML IV EMUL
INTRAVENOUS | Status: DC | PRN
  Administered 2020-04-01: 100 ug/kg/min via INTRAVENOUS

## 2020-04-01 MED ORDER — ALUM & MAG HYDROXIDE-SIMETH 200-200-20 MG/5ML PO SUSP: 30.0000 mL | ORAL | Status: DC | PRN

## 2020-04-01 MED ORDER — SODIUM CHLORIDE 0.9 % IV SOLN
INTRAVENOUS | Status: DC | PRN
  Administered 2020-04-01 (×2): 50 ug via INTRAVENOUS
  Administered 2020-04-01: 100 ug via INTRAVENOUS
  Administered 2020-04-01 (×2): 50 ug via INTRAVENOUS
  Administered 2020-04-01: 100 ug via INTRAVENOUS
  Administered 2020-04-01 (×6): 50 ug via INTRAVENOUS
  Administered 2020-04-01: 100 ug via INTRAVENOUS

## 2020-04-01 MED ORDER — LACTATED RINGERS IV BOLUS
500.0000 mL | Freq: Once | INTRAVENOUS | Status: AC
  Administered 2020-04-01: 500 mL via INTRAVENOUS

## 2020-04-01 MED ORDER — ONDANSETRON HCL 4 MG PO TABS: 4.0000 mg | ORAL_TABLET | Freq: Four times a day (QID) | ORAL | Status: DC | PRN

## 2020-04-01 MED ORDER — NEOMYCIN-POLYMYXIN B GU 40-200000 IR SOLN
Status: AC
  Filled 2020-04-01: qty 20

## 2020-04-01 MED ORDER — TRAMADOL HCL 50 MG PO TABS
ORAL_TABLET | ORAL | Status: AC
  Administered 2020-04-01: 50 mg via ORAL
  Filled 2020-04-01: qty 1

## 2020-04-01 MED ORDER — MIDAZOLAM HCL 5 MG/5ML IJ SOLN
INTRAMUSCULAR | Status: DC | PRN
  Administered 2020-04-01: 2 mg via INTRAVENOUS

## 2020-04-01 MED ORDER — LIDOCAINE HCL (PF) 2 % IJ SOLN
INTRAMUSCULAR | Status: AC
  Filled 2020-04-01: qty 5

## 2020-04-01 MED ORDER — ACETAMINOPHEN 10 MG/ML IV SOLN
INTRAVENOUS | Status: DC | PRN
  Administered 2020-04-01: 1000 mg via INTRAVENOUS

## 2020-04-01 MED ORDER — BUPIVACAINE HCL (PF) 0.5 % IJ SOLN
INTRAMUSCULAR | Status: AC
  Filled 2020-04-01: qty 10

## 2020-04-01 MED ORDER — CEFAZOLIN SODIUM-DEXTROSE 2-4 GM/100ML-% IV SOLN: 2.0000 g | Freq: Four times a day (QID) | INTRAVENOUS | Status: DC

## 2020-04-01 MED ORDER — ONDANSETRON HCL 4 MG/2ML IJ SOLN
INTRAMUSCULAR | Status: DC | PRN
  Administered 2020-04-01: 4 mg via INTRAVENOUS

## 2020-04-01 MED ORDER — ONDANSETRON HCL 4 MG/2ML IJ SOLN
INTRAMUSCULAR | Status: AC
  Filled 2020-04-01: qty 2

## 2020-04-01 MED ORDER — HYDROCODONE-ACETAMINOPHEN 7.5-325 MG PO TABS
1.0000 | ORAL_TABLET | ORAL | 0 refills | Status: DC | PRN
Start: 2020-04-01 — End: 2020-05-23

## 2020-04-01 MED ORDER — METHOCARBAMOL 500 MG PO TABS: 500.0000 mg | ORAL_TABLET | Freq: Four times a day (QID) | ORAL | Status: DC | PRN

## 2020-04-01 MED ORDER — CHLORHEXIDINE GLUCONATE 0.12 % MT SOLN
OROMUCOSAL | Status: AC
  Administered 2020-04-01: 15 mL via OROMUCOSAL
  Filled 2020-04-01: qty 15

## 2020-04-01 MED ORDER — ORAL CARE MOUTH RINSE: 15.0000 mL | Freq: Once | OROMUCOSAL | Status: AC

## 2020-04-01 MED ORDER — EPHEDRINE SULFATE 50 MG/ML IJ SOLN
INTRAMUSCULAR | Status: DC | PRN
  Administered 2020-04-01 (×2): 5 mg via INTRAVENOUS

## 2020-04-01 MED ORDER — SURGIPHOR WOUND IRRIGATION SYSTEM - OPTIME
TOPICAL | Status: DC | PRN
  Administered 2020-04-01: 450 mL

## 2020-04-01 MED ORDER — BUPIVACAINE HCL (PF) 0.5 % IJ SOLN
INTRAMUSCULAR | Status: DC | PRN
  Administered 2020-04-01: 2.6 mL

## 2020-04-01 MED ORDER — ACETAMINOPHEN 10 MG/ML IV SOLN
INTRAVENOUS | Status: AC
  Filled 2020-04-01: qty 100

## 2020-04-01 MED ORDER — HYDROCODONE-ACETAMINOPHEN 7.5-325 MG PO TABS
1.0000 | ORAL_TABLET | ORAL | Status: DC | PRN
  Administered 2020-04-01: 1 via ORAL

## 2020-04-01 MED ORDER — METHOCARBAMOL 1000 MG/10ML IJ SOLN
500.0000 mg | Freq: Four times a day (QID) | INTRAVENOUS | Status: DC | PRN
  Filled 2020-04-01: qty 5

## 2020-04-01 SURGICAL SUPPLY — 75 items
APL PRP STRL LF DISP 70% ISPRP (MISCELLANEOUS) ×2
BLADE SAGITTAL 25.0X1.19X90 (BLADE) ×2 IMPLANT
BLADE SAW 90X13X1.19 OSCILLAT (BLADE) ×2 IMPLANT
BLOCK CUTTING FEMUR 3 LT MED (MISCELLANEOUS) ×2 IMPLANT
BLOCK CUTTING TIBIAL 2 LT (MISCELLANEOUS) ×2 IMPLANT
BNDG ELASTIC 6X5.8 VLCR STR LF (GAUZE/BANDAGES/DRESSINGS) ×2 IMPLANT
CANISTER SUCT 3000ML PPV (MISCELLANEOUS) ×4 IMPLANT
CANISTER WOUND CARE 500ML ATS (WOUND CARE) ×2 IMPLANT
CEMENT HV SMART SET (Cement) ×4 IMPLANT
CHLORAPREP W/TINT 26 (MISCELLANEOUS) ×4 IMPLANT
COOLER POLAR GLACIER W/PUMP (MISCELLANEOUS) ×2 IMPLANT
COVER WAND RF STERILE (DRAPES) ×2 IMPLANT
CUFF TOURN SGL QUICK 24 (TOURNIQUET CUFF) ×2
CUFF TOURN SGL QUICK 30 (TOURNIQUET CUFF)
CUFF TRNQT CYL 24X4X16.5-23 (TOURNIQUET CUFF) ×1 IMPLANT
CUFF TRNQT CYL 30X4X21-28X (TOURNIQUET CUFF) IMPLANT
DRAPE 3/4 80X56 (DRAPES) ×4 IMPLANT
DRSG MEPILEX SACRM 8.7X9.8 (GAUZE/BANDAGES/DRESSINGS) ×2 IMPLANT
ELECT CAUTERY BLADE 6.4 (BLADE) IMPLANT
ELECT REM PT RETURN 9FT ADLT (ELECTROSURGICAL) ×2
ELECTRODE REM PT RTRN 9FT ADLT (ELECTROSURGICAL) ×1 IMPLANT
FEMORAL COMP CEMENTED SZ3 L (Femur) ×2 IMPLANT
FEMUR BONE MODEL 4.9010 MEDACT (MISCELLANEOUS) ×2 IMPLANT
GAUZE SPONGE 4X4 12PLY STRL (GAUZE/BANDAGES/DRESSINGS) ×2 IMPLANT
GAUZE XEROFORM 1X8 LF (GAUZE/BANDAGES/DRESSINGS) ×2 IMPLANT
GLOVE BIOGEL PI IND STRL 9 (GLOVE) ×1 IMPLANT
GLOVE BIOGEL PI INDICATOR 9 (GLOVE) ×1
GLOVE INDICATOR 8.0 STRL GRN (GLOVE) ×2 IMPLANT
GLOVE SURG ORTHO LTX SZ8 (GLOVE) ×2 IMPLANT
GLOVE SURG SYN 9.0  PF PI (GLOVE) ×1
GLOVE SURG SYN 9.0 PF PI (GLOVE) ×1 IMPLANT
GOWN SRG 2XL LVL 4 RGLN SLV (GOWNS) ×1 IMPLANT
GOWN STRL NON-REIN 2XL LVL4 (GOWNS) ×2
GOWN STRL REUS W/ TWL LRG LVL3 (GOWN DISPOSABLE) ×1 IMPLANT
GOWN STRL REUS W/ TWL XL LVL3 (GOWN DISPOSABLE) ×1 IMPLANT
GOWN STRL REUS W/TWL LRG LVL3 (GOWN DISPOSABLE) ×2
GOWN STRL REUS W/TWL XL LVL3 (GOWN DISPOSABLE) ×2
HOLDER FOLEY CATH W/STRAP (MISCELLANEOUS) ×2 IMPLANT
HOOD PEEL AWAY FLYTE STAYCOOL (MISCELLANEOUS) ×4 IMPLANT
IRRIGATION SURGIPHOR STRL (IV SOLUTION) ×2 IMPLANT
KIT PREVENA INCISION MGT20CM45 (CANNISTER) ×2 IMPLANT
KIT TURNOVER KIT A (KITS) ×2 IMPLANT
MANIFOLD NEPTUNE II (INSTRUMENTS) ×4 IMPLANT
NDL SAFETY ECLIPSE 18X1.5 (NEEDLE) ×1 IMPLANT
NEEDLE HYPO 18GX1.5 SHARP (NEEDLE) ×2
NEEDLE SPNL 18GX3.5 QUINCKE PK (NEEDLE) ×2 IMPLANT
NEEDLE SPNL 20GX3.5 QUINCKE YW (NEEDLE) ×2 IMPLANT
NS IRRIG 1000ML POUR BTL (IV SOLUTION) ×2 IMPLANT
PACK TOTAL KNEE (MISCELLANEOUS) ×2 IMPLANT
PAD WRAPON POLAR KNEE (MISCELLANEOUS) ×1 IMPLANT
PATELLA RESURFACING MEDACTA 02 (Bone Implant) ×2 IMPLANT
PENCIL SMOKE EVACUATOR COATED (MISCELLANEOUS) ×2 IMPLANT
PULSAVAC PLUS IRRIG FAN TIP (DISPOSABLE) ×2
SCALPEL PROTECTED #10 DISP (BLADE) ×4 IMPLANT
SOL .9 NS 3000ML IRR  AL (IV SOLUTION) ×1
SOL .9 NS 3000ML IRR AL (IV SOLUTION) ×1
SOL .9 NS 3000ML IRR UROMATIC (IV SOLUTION) ×1 IMPLANT
STAPLER SKIN PROX 35W (STAPLE) ×2 IMPLANT
STEM EXTENSION 11MMX30MM (Stem) ×2 IMPLANT
SUCTION FRAZIER HANDLE 10FR (MISCELLANEOUS) ×1
SUCTION TUBE FRAZIER 10FR DISP (MISCELLANEOUS) ×1 IMPLANT
SUT DVC 2 QUILL PDO  T11 36X36 (SUTURE) ×1
SUT DVC 2 QUILL PDO T11 36X36 (SUTURE) ×1 IMPLANT
SUT ETHIBOND 2 V 37 (SUTURE) IMPLANT
SUT V-LOC 90 ABS DVC 3-0 CL (SUTURE) ×2 IMPLANT
SYR 20ML LL LF (SYRINGE) ×2 IMPLANT
SYR 50ML LL SCALE MARK (SYRINGE) ×4 IMPLANT
TIBIAL BONE MODEL LEFT (MISCELLANEOUS) ×2 IMPLANT
TIBIAL INSERT SZ2 LEFT (Insert) ×2 IMPLANT
TIBIAL TRAY FIXED MEDACTA 0207 (Joint) ×2 IMPLANT
TIP FAN IRRIG PULSAVAC PLUS (DISPOSABLE) ×1 IMPLANT
TOWEL OR 17X26 4PK STRL BLUE (TOWEL DISPOSABLE) ×2 IMPLANT
TOWER CARTRIDGE SMART MIX (DISPOSABLE) ×2 IMPLANT
TRAY FOLEY MTR SLVR 16FR STAT (SET/KITS/TRAYS/PACK) ×2 IMPLANT
WRAPON POLAR PAD KNEE (MISCELLANEOUS) ×2

## 2020-04-01 NOTE — Evaluation (Signed)
Physical Therapy Evaluation Patient Details Name: SHARONLEE NINE MRN: [id number] DOB: 1945-11-13 Today's Date: 04/01/2020   History of Present Illness  75 y/o female s/p L TKA 04/01/20, h/o R TKA 10/2017.  Clinical Impression  Pt eager to work with PT and ultimately did quite well.  She had >90 deg of flexion, easily did SLRs, was able to ambulate ~125 ft with consistent cadence and negotiated up/down steps w/ only minimal cuing.  She has achieved typical POD1 expectations and will be safe to return home once medically cleared.  Pt did well with R TKA 2.5 years ago; reports she remembers some of the positioning/exercises/expectations and PT further reinforced and educated on appropriate progress at home and answered all questions.     Follow Up Recommendations Home health PT;Follow surgeon's recommendation for DC plan and follow-up therapies    Equipment Recommendations  None recommended by PT    Recommendations for Other Services       Precautions / Restrictions Precautions Precautions: Knee;Fall Precaution Booklet Issued: Yes (comment) (HEP) Restrictions Weight Bearing Restrictions: Yes LLE Weight Bearing: Weight bearing as tolerated      Mobility  Bed Mobility Overal bed mobility: Independent             General bed mobility comments: Pt able to get to EOB w/o issue    Transfers Overall transfer level: Modified independent Equipment used: Rolling walker (2 wheeled)             General transfer comment: some cuing to insure appropriate set up and sequencing, but no assist needed from standard height surface  Ambulation/Gait Ambulation/Gait assistance: Supervision Gait Distance (Feet): 125 Feet Assistive device: Rolling walker (2 wheeled)       General Gait Details: Pt able to assume consistent walker momentum relatively quickly and showed good confidence and safety with in-home appropriate cadence, speed, etc  Stairs Stairs: Yes Stairs assistance:  Supervision Stair Management: Two rails;Forwards Number of Stairs: 8 General stair comments: Pt easily negotiated up/down 4 steps X 2 with b/l rails and only initial sequencing  strategy.  Wheelchair Mobility    Modified Rankin (Stroke Patients Only)       Balance Overall balance assessment: Modified Independent                                           Pertinent Vitals/Pain Pain Assessment: 0-10 Pain Score: 4     Home Living Family/patient expects to be discharged to:: Private residence Living Arrangements: Spouse/significant other Available Help at Discharge: Family;Available 24 hours/day   Home Access: Stairs to enter Entrance Stairs-Rails: Can reach both Entrance Stairs-Number of Steps: 3 Home Layout: One level Home Equipment: Walker - 2 wheels;Bedside commode      Prior Function Level of Independence: Independent         Comments: Pt is able to stay active and manage ADLs w/o assist     Hand Dominance        Extremity/Trunk Assessment   Upper Extremity Assessment Upper Extremity Assessment: Overall WFL for tasks assessed    Lower Extremity Assessment Lower Extremity Assessment: Overall WFL for tasks assessed (expected post-op weakness on L)       Communication   Communication: No difficulties  Cognition Arousal/Alertness: Awake/alert Behavior During Therapy: WFL for tasks assessed/performed Overall Cognitive Status: Within Functional Limits for tasks assessed  General Comments      Exercises Total Joint Exercises Ankle Circles/Pumps: AROM;10 reps Quad Sets: Strengthening;10 reps Short Arc Quad: 10 reps;Strengthening Heel Slides: Strengthening;10 reps Hip ABduction/ADduction: Strengthening;10 reps Straight Leg Raises: AROM;10 reps Knee Flexion: PROM;5 reps Goniometric ROM: 0-92   Assessment/Plan    PT Assessment Patient needs continued PT services  PT Problem  List Decreased strength;Decreased range of motion;Decreased balance;Decreased activity tolerance;Decreased mobility;Decreased coordination;Decreased knowledge of use of DME;Decreased safety awareness;Pain       PT Treatment Interventions DME instruction;Gait training;Stair training;Functional mobility training;Therapeutic exercise;Therapeutic activities;Balance training;Patient/family education;Neuromuscular re-education    PT Goals (Current goals can be found in the Care Plan section)  Acute Rehab PT Goals Patient Stated Goal: Pt eager to go home PT Goal Formulation: With patient Time For Goal Achievement: 04/15/20 Potential to Achieve Goals: Good    Frequency BID   Barriers to discharge        Co-evaluation               AM-PAC PT "6 Clicks" Mobility  Outcome Measure Help needed turning from your back to your side while in a flat bed without using bedrails?: None Help needed moving from lying on your back to sitting on the side of a flat bed without using bedrails?: None Help needed moving to and from a bed to a chair (including a wheelchair)?: None Help needed standing up from a chair using your arms (e.g., wheelchair or bedside chair)?: None Help needed to walk in hospital room?: A Little Help needed climbing 3-5 steps with a railing? : A Little 6 Click Score: 22    End of Session Equipment Utilized During Treatment: Gait belt Activity Tolerance: Patient tolerated treatment well Patient left: in chair;with call bell/phone within reach Nurse Communication: Mobility status PT Visit Diagnosis: Muscle weakness (generalized) (M62.81);Difficulty in walking, not elsewhere classified (R26.2)    Time: 6226-3335 PT Time Calculation (min) (ACUTE ONLY): 36 min   Charges:   PT Evaluation $PT Eval Low Complexity: 1 Low PT Treatments $Gait Training: 8-22 mins $Therapeutic Exercise: 8-22 mins        Kreg Shropshire, DPT 04/01/2020, 1:26 PM

## 2020-04-01 NOTE — Op Note (Signed)
04/01/2020  9:27 AM  PATIENT:  Jessica Sullivan   MRN: [id number]  PRE-OPERATIVE DIAGNOSIS:  Primary localized osteoarthritis of left knee   POST-OPERATIVE DIAGNOSIS:  Same   PROCEDURE:  Procedure(s): Left TOTAL KNEE ARTHROPLASTY   SURGEON: Laurene Footman, MD   ASSISTANTS: Rachelle Hora, PA-C   ANESTHESIA:   spinal   EBL:     BLOOD ADMINISTERED:none   DRAINS: Incisional wound VAC    LOCAL MEDICATIONS USED:  MARCAINE    and OTHER Exparel morphine   SPECIMEN:  No Specimen   DISPOSITION OF SPECIMEN:  N/A   COUNTS:  YES   TOURNIQUET:   44 minutes at 300 mm Hg   IMPLANTS: Medacta  GMK sphere system with  3 left femur, 2 left tibia with short stem and  11 mm insert.  Size  2 patella, all components cemented.   DICTATION: Viviann Spare Dictation   patient was brought to the operating room and spinal anesthesia was obtained.  After prepping and draping the  left leg in sterile fashion, and after patient identification and timeout procedures were completed, tourniquet was raised  and midline skin incision was made followed by medial parapatellar arthrotomy with  severe medial compartment osteoarthritis, severe patellofemoral arthritis and  mild lateral compartment arthritis, partial synovectomy was also carried out.   The ACL and PCL and fat pad were excised along with anterior horns of the meniscus. The proximal tibia cutting guide from  the [facility] system was applied and the proximal tibia cut carried out.  The distal femoral cut was carried out in a similar fashion     The  3 femoral cutting guide applied with anterior posterior and chamfer cuts made.  The posterior horns of the menisci were removed at this point.   Injection of the above medication was carried out after the femoral and tibial cuts were carried out.  The 2 baseplate trial was placed pinned into position and proximal tibial preparation carried out with drilling hand reaming and the keel punch followed by placement of the  3  femur and sizing the tibial insert size   11 millimeter gave the best fit with stability and full extension.  The distal femoral drill holes were made in the notch cut for the trochlear groove was then carried out with trials were then removed the patella was cut using the patellar cutting guide and it sized to a size  2 after drill holes have been made  The knee was irrigated with pulsatile lavage and the bony surfaces dried the tibial component was cemented into place first.  Excess cement was removed and the polyethylene insert placed with a torque screw placed with a torque screwdriver tightened.  The distal femoral component was placed and the knee was held in extension as the patellar button was clamped into place.  After the cement was set, excess cement was removed and the knee was again irrigated thoroughly thoroughly irrigated.  The tourniquet was let down and hemostasis checked with electrocautery. The arthrotomy was repaired with a heavy Quill suture,  followed by 3-0 V lock subcuticular closure, skin staples followed by incisional wound VAC and Polar Care.Marland Kitchen   PLAN OF CARE: Discharge to home after PACU   PATIENT DISPOSITION:  PACU - hemodynamically stable.

## 2020-04-01 NOTE — H&P (Signed)
History of Present Illness: Jessica Sullivan is a 75 y.o. female who comes to clinic today for history and physical for left total knee arthroplasty with Dr. Hessie Knows on April 01 2020. Patient has had years of progressive left knee pain, x-ray showing degenerative changes in the medial compartment with 90% loss of joint space and slight varus deformity. She has failed conservative treatment with cortisone injections and viscosupplementation. She describes generalized aching pain that is most severe along the medial compartment. No catching clicking or locking. Occasionally she will have tightness and swelling throughout the knee and occasionally her knee will feel as if it is 1 to buckle and give way. Pain is limiting her of her activities of daily living and quality of life. She has had a successful right total knee arthroplasty and is ready to proceed with left TKA.  Past Medical History: Past Medical History:  Diagnosis Date  . Chickenpox  . Mumps   Past Surgical History: Past Surgical History:  Procedure Laterality Date  . CESAREAN SECTION  x1  . Hammer Toe Repair Left  . KNEE ARTHROSCOPY Right 05/27/11  Right Knee Arthroscopy with Partial Medical Menisectectomy and plica excision.  . TONSILLECTOMY  . TOTAL KNEE ARTHROPLASTY (Right) Right 10/11/2017  Dr. Rudene Christians  . Varicose Vein Stripping 03/2004  x3   Past Family History: Family History  Problem Relation Age of Onset  . Dementia Mother  . Lung cancer Father   Medications: Current Outpatient Medications Ordered in Epic  Medication Sig Dispense Refill  . calcium carbonate-vitamin D3 600 mg calcium- 200 unit Cap Take 1 tablet by mouth 2 (two) times daily.  . cephalexin (KEFLEX) 500 MG capsule Take 4 pills one hour prior to dental appointment. 12 capsule 1  . clobetasol (TEMOVATE) 0.05 % ointment Twice a week  . glucosamine HCl 750 mg Tab Take 1 tablet by mouth 2 (two) times daily.  . multivitamin-lutein (CENTRUM SILVER) tablet  Take 1 tablet by mouth once daily.  . salmon oil-omega-3 fatty acids (FISH OIL) 500-100 mg Cap capsule Take 1 capsule by mouth 2 (two) times daily.  . vit A/C/E ac/ZnOx/cupric oxide (EYE VITAMIN AND MINERALS ORAL) Take 1 tablet by mouth once daily   No current Epic-ordered facility-administered medications on file.   Allergies: Allergies  Allergen Reactions  . Flavoring Agent Unknown  Only peach snapps  . Oxycodone Other (See Comments)  Light headed.  . Poison Ivy Extract Itching and Rash    There is no height or weight on file to calculate BMI.  Review of Systems:  A comprehensive 14 point ROS was performed, reviewed, and the pertinent orthopaedic findings are documented in the HPI.  Physical Exam:  There were no vitals filed for this visit. General:  Well developed, well nourished, no apparent distress, normal affect, ambulates with slight flexion contracture left knee, no assistive devices  HEENT: Head normocephalic, atraumatic, PERRL.   Abdomen: Soft, non tender, non distended, Bowel sounds present.  Heart: Examination of the heart reveals regular, rate, and rhythm. There is no murmur noted on ascultation. There is a normal apical pulse.  Lungs: Lungs are clear to auscultation. There is no wheeze, rhonchi, or crackles. There is normal expansion of bilateral chest walls.   Orthopaedic Examination: Right Left  Mild swelling, mild effusion left knee  Gait Normal  Alignment slight varus to the left  Inspection Normal Normal  Palpation Knee Normal Normal  Range of Motion Knee Normal 5 to 110 degrees  Strength Normal Normal  Meniscus Exam Normal Normal  Ligament Exam Normal Normal  Patella Exam Normal Normal  Reflexes Normal Normal  Neurologic Normal Normal   Imaging Studies: X-rays the left knee reviewed by me today shows 90% loss of joint space and medial compartment with slight varus deformity. No evidence of acute bony abnormality. X-rays taken  11/19/2019  Assessment:  ICD-10-CM  1. Primary localized osteoarthritis of left knee M17.12    Plan: 1. Risks, benefits, complications of a left total knee arthroplasty have been discussed with the patient. Patient has agreed and consented the procedure with Dr. Hessie Knows on 04/01/2020  Electronically signed by Feliberto Gottron, PA at 03/13/2020 10:05 AM EST    Reviewed  H+P. No changes noted.

## 2020-04-01 NOTE — Anesthesia Procedure Notes (Signed)
Spinal  Patient location during procedure: OR Staffing Performed: resident/CRNA and other anesthesia staff  Anesthesiologist: Alvin Critchley, MD Resident/CRNA: Rolla Plate, CRNA Other anesthesia staff: Elenora Gamma, RN Preanesthetic Checklist Completed: patient identified, IV checked, site marked, risks and benefits discussed, surgical consent, monitors and equipment checked, pre-op evaluation and timeout performed Spinal Block Patient position: sitting Prep: ChloraPrep and site prepped and draped Patient monitoring: heart rate, continuous pulse ox, blood pressure and cardiac monitor Approach: midline Location: L4-5 Injection technique: single-shot Needle Needle type: Quincke  Needle gauge: 22 G Needle length: 9 cm Additional Notes Negative paresthesia. Negative blood return. Positive free-flowing CSF. Expiration date of kit checked and confirmed. Patient tolerated procedure well, without complications.

## 2020-04-01 NOTE — Transfer of Care (Signed)
Immediate Anesthesia Transfer of Care Note  Patient: Jessica Sullivan  Procedure(s) Performed: TOTAL KNEE ARTHROPLASTY (Left Knee)  Patient Location: PACU  Anesthesia Type:Spinal  Level of Consciousness: awake, alert  and oriented  Airway & Oxygen Therapy: Patient Spontanous Breathing and Patient connected to face mask oxygen  Post-op Assessment: Report given to RN and Post -op Vital signs reviewed and stable  Post vital signs: Reviewed  Last Vitals:  Vitals Value Taken Time  BP    Temp    Pulse    Resp    SpO2      Last Pain:  Vitals:   04/01/20 0616  TempSrc: Tympanic  PainSc: 0-No pain      Patients Stated Pain Goal: 0 (83/66/29 4765)  Complications: No complications documented.

## 2020-04-01 NOTE — Anesthesia Preprocedure Evaluation (Signed)
Anesthesia Evaluation  Patient identified by MRN, date of birth, ID band Patient awake    Reviewed: Allergy & Precautions, NPO status , Patient's Chart, lab work & pertinent test results  History of Anesthesia Complications Negative for: history of anesthetic complications  Airway Mallampati: II       Dental   Pulmonary neg pulmonary ROS, neg sleep apnea, neg COPD, Not current smoker,           Cardiovascular (-) hypertension(-) Past MI and (-) CHF negative cardio ROS  (-) dysrhythmias (-) Valvular Problems/Murmurs     Neuro/Psych negative neurological ROS  negative psych ROS   GI/Hepatic negative GI ROS, Neg liver ROS, neg GERD  ,  Endo/Other  neg diabetes  Renal/GU negative Renal ROS  Female GU complaint     Musculoskeletal  (+) Arthritis ,   Abdominal   Peds negative pediatric ROS (+)  Hematology   Anesthesia Other Findings   Reproductive/Obstetrics                             Anesthesia Physical  Anesthesia Plan  ASA: II  Anesthesia Plan: Spinal   Post-op Pain Management:    Induction:   PONV Risk Score and Plan:   Airway Management Planned: Nasal Cannula  Additional Equipment:   Intra-op Plan:   Post-operative Plan:   Informed Consent: I have reviewed the patients History and Physical, chart, labs and discussed the procedure including the risks, benefits and alternatives for the proposed anesthesia with the patient or authorized representative who has indicated his/her understanding and acceptance.       Plan Discussed with: CRNA and Surgeon  Anesthesia Plan Comments:         Anesthesia Quick Evaluation

## 2020-04-01 NOTE — Discharge Instructions (Addendum)
Negative Pressure Wound Therapy Home Guide Negative pressure wound therapy (NPWT) uses a sponge or foam-like material (dressing) placed on or inside the wound. The wound is then covered and sealed with a cover dressing that sticks to your skin (is adhesive). This keeps air out. A tube is attached to the cover dressing, and this tube connects to a small pump. The pump sucks fluid and germs from the wound. NPWT helps to increase blood flow to the wound and heal it from the inside. What are the risks? NPWT is usually safe to use. However, problems can occur, including:  Skin irritation from the dressing adhesive.  Bleeding.  Infection.  Dehydration. Wounds with large amounts of drainage can cause excessive fluid loss.  Pain. Supplies needed:  A disposable garbage bag.  Soap and water, or hand sanitizer.  Wound cleanser or salt-water solution (saline).  New sponge and cover dressing.  Protective clothing.  Gauze pad.  Vinyl gloves.  Tape.  Skin protectant. This may be a wipe, film, or spray.  Clean or germ-free (sterile) scissors.  Eye protection. How to change your dressing Prepare to change your dressing 1. If told by your health care provider, take pain medicine 30 minutes before changing the dressing. 2. Wash your hands with soap and water. Dry your hands with a clean towel. If soap and water are not available, use hand sanitizer. 3. Set up a clean station for wound care. 4. Open the dressing package so that the sponge dressing remains on the inside of the package. 5. Wear gloves, protective clothing, and eye protection.   Remove old dressing 1. Turn off the pump and disconnect the tubing from the dressing. 2. Carefully remove the adhesive cover dressing in the direction of your hair growth. 3. Remove the sponge dressing that is inside the wound. If the sponge sticks, use a wound cleanser or saline solution to wet the sponge and help it come off more easily. 4. Throw  the old sponge and cover dressing supplies into the garbage bag. 5. Remove your gloves by grabbing the cuff and turning the glove inside out. Place the gloves in the trash immediately. 6. Wash your hands with soap and water. Dry your hands with a clean towel. If soap and water are not available, use hand sanitizer.   Clean your wound  Wear gloves, protective clothing, and eye protection. Follow your health care provider's instructions on how to clean your wound. You may be told to: 1. Clean the wound using a saline solution or a wound cleanser and a clean gauze pad. 2. Pat the wound dry with a gauze pad. Do not rub the wound. 3. Throw the gauze pad into the garbage bag. 4. Remove your gloves by grabbing the cuff and turning the glove inside out. Place the gloves in the trash immediately. 5. Wash your hands with soap and water. Dry your hands with a clean towel. If soap and water are not available, use hand sanitizer. Apply new dressing  Wear gloves, protective clothing, and eye protection. 1. If told by your health care provider, apply a skin protectant to any skin that will be exposed to adhesive. Let the skin protectant dry. 2. Cut a piece of new sponge dressing and put it on or in the wound. 3. Using clean scissors, cut a nickel-sized hole in the new cover dressing. 4. Apply the cover dressing. 5. Attach the suction tube over the hole in the cover dressing. 6. Take off your gloves. Put them  in the plastic bag with the old dressing. Tie the bag shut and throw it away. 7. Wash your hands with soap and water. Dry your hands with a clean towel. If soap and water are not available, use hand sanitizer. 8. Turn the pump back on. The sponge dressing should collapse. Do not change the settings on the machine without talking to a health care provider. 9. Replace the container in the pump that collects fluid if it is full. Replace the container per the manufacturer's instructions or at least once a  week, even if it is not full. General tips and recommendations If the alarm sounds:  Stay calm.  Do not turn off the pump or do anything with the dressing.  Reasons the alarm may go off: ? The battery is low. Change the battery or plug the device into electrical power. ? The dressing has a leak. Find the leak and put tape over the leak. ? The fluid collection container is full. Change the fluid container.  Call your health care provider right away if you cannot fix the problem.  Explain to your health care provider what is happening. Follow his or her instructions. General instructions  Do not turn off the pump unless told to do so by your health care provider.  Do not turn off the pump for more than 2 hours. If the pump is off for more than 2 hours, the dressing will need to be changed.  If your health care provider says it is okay to shower: ? Do not take the pump into the shower. ? Make sure the wound dressing is protected and sealed. The wound dressing must stay dry.  Check frequently that the machine indicates that therapy is on and that all clamps are open.  Do not use over-the-counter medicated or antiseptic creams, sprays, liquids, or dressings unless your health care provider approves. Contact a health care provider if:  You have new pain.  You develop irritation, a rash, or itching around the wound or dressing.  You see new black or yellow tissue in your wound.  The dressing changes are painful or cause bleeding.  The pump has been off for more than 2 hours, and you do not know how to change the dressing.  The pump alarm goes off, and you do not know what to do. Get help right away if:  You have a lot of bleeding.  The wound breaks open.  You have severe pain.  You have signs of infection, such as: ? More redness, swelling, or pain. ? More fluid or blood. ? Warmth. ? Pus or a bad smell. ? Red streaks leading from the wound. ? A fever.  You see a  sudden change in the color or texture of the drainage.  You have signs of dehydration, such as: ? Little or no tears, urine, or sweat. ? Muscle cramps. ? Very dry mouth. ? Headache. ? Dizziness. Summary  Negative pressure wound therapy (NPWT) is a device that helps your wound heal.  Set up a clean station for wound care. Your health care provider will tell you what supplies to use.  Follow your health care provider's instructions on how to clean your wound and how to change the dressing.  Contact a health care provider if you have new pain, an irritation, or a rash, or if the alarm goes off and you do not know what to do.  Get help right away if you have a lot of bleeding, your  wound breaks open, or you have severe pain. Also, get help if you have signs of infection. This information is not intended to replace advice given to you by your health care provider. Make sure you discuss any questions you have with your health care provider. Document Revised: 04/30/2019 Document Reviewed: 05/12/2018 Elsevier Patient Education  2021 [employer] Total Knee Replacement, Care After This sheet gives you information about how to care for yourself after your procedure. Your health care provider may also give you more specific instructions. If you have problems or questions, contact your health care provider. What can I expect after the procedure? After the procedure, it is common to have:  Redness, pain, and swelling at the incision area.  Stiffness.  Discomfort.  A small amount of blood or clear fluid coming from your incision. Follow these instructions at home: Medicines  Take over-the-counter and prescription medicines only as told by your health care provider.  If you were prescribed a blood thinner (anticoagulant), take it as told by your health care provider.  Ask your health care provider if the medicine prescribed to you: ? Requires you to avoid driving or using  machinery. ? Can cause constipation. You may need to take these actions to prevent or treat constipation:  Drink enough fluid to keep your urine pale yellow.  Take over-the-counter or prescription medicines.  Eat foods that are high in fiber, such as beans, whole grains, and fresh fruits and vegetables.  Limit foods that are high in fat and processed sugars, such as fried or sweet foods. Incision care  Follow instructions from your health care provider about how to take care of your incision. Make sure you: ? Wash your hands with soap and water for at least 20 seconds before and after you change your bandage (dressing). If soap and water are not available, use hand sanitizer. ? Change your dressing as told by your health care provider. ? Leave stitches (sutures), staples, skin glue, or adhesive strips in place. These skin closures may need to stay in place for 2 weeks or longer. If adhesive strip edges start to loosen and curl up, you may trim the loose edges. Do not remove adhesive strips completely unless your health care provider tells you to do that.  Do not take baths, swim, or use a hot tub until your health care provider approves.  Check your incision area every day for signs of infection. Check for: ? More redness, swelling, or pain. ? More fluid or blood. ? Warmth. ? Pus or a bad smell.   Activity  Rest as told by your health care provider.  Avoid sitting for a long time without moving. Get up to take short walks every 1-2 hours. This is important to improve blood flow and breathing. Ask for help if you feel weak or unsteady.  Follow instructions from your health care provider about using a walker, crutches, or a cane. ? You may use your legs to support (bear) your body weight as told by your health care provider. Follow instructions about how much weight you may safely support on your affected leg (weight-bearing restrictions). ? A physical therapist may show you how to get  out of a bed and chair and how to go up and down stairs. You will first do this with a walker, crutches, or a cane and then without any of these devices. ? Once you are able to walk without a limp, you may stop using a walker, crutches, or a  cane.  Do exercises as told by your health care provider or physical therapist.  Avoid high-impact activities, including running, jumping rope, and doing jumping jacks.  Do not play contact sports until your health care provider approves.  Return to your normal activities as told by your health care provider. Ask your health care provider what activities are safe for you. Managing pain, stiffness, and swelling  If directed, put ice on your knee. To do this: ? Put ice in a plastic bag or use the icing device (cold flow pad) that you were given. Follow instructions from your health care provider about how to use the icing device. ? Place a towel between your skin and the bag or between your skin and the icing device. ? Leave the ice on for 20 minutes, 2-3 times a day. ? Remove the ice if your skin turns bright red. This is very important. If you cannot feel pain, heat, or cold, you have a greater risk of damage to the area.  Move your toes often to reduce stiffness and swelling.  Raise (elevate) your leg above the level of your heart while you are sitting or lying down. ? Use several pillows to keep your leg straight. ? Do not put a pillow just under the knee. If the knee is bent for a long time, this may lead to stiffness.  Wear elastic knee support as told by your health care provider.   Safety  To help prevent falls, keep floors clear of objects you may trip over. Place items that you may need within easy reach.  Wear an apron or tool belt with pockets for carrying objects. This leaves your hands free to help with your balance.  Ask your health care provider when it is safe to drive.   General instructions  Wear compression stockings as told by  your health care provider. These stockings help to prevent blood clots and reduce swelling in your legs.  Continue with breathing exercises. This helps prevent lung infection.  Do not use any products that contain nicotine or tobacco. These products include cigarettes, chewing tobacco, and vaping devices, such as e-cigarettes. These can delay healing after surgery. If you need help quitting, ask your health care provider.  Tell your health care provider if you plan to have dental work. Also: ? Tell your dentist about your joint replacement. ? Ask your health care provider if there are any special instructions you need to follow before having dental care and routine cleanings.  Keep all follow-up visits. This is important. Contact a health care provider if:  You have a fever or chills.  You have a cough or feel short of breath.  Your medicine is not controlling your pain.  You have any of these signs of infection: ? More redness, swelling, or pain around your incision. ? More fluid or blood coming from your incision. ? Warmth coming from your incision. ? Pus or a bad smell coming from your incision.  You fall. Get help right away if:  You have severe pain.  You have trouble breathing.  You have chest pain.  You have redness, swelling, pain, or warmth in your calf or leg.  Your incision breaks open after sutures or staples are removed. These symptoms may represent a serious problem that is an emergency. Do not wait to see if the symptoms will go away. Get medical help right away. Call your local emergency services (911 in the [place] Do not drive yourself to the  hospital. Summary  After the procedure, it is common to have pain and swelling at the incision area, a small amount of blood or fluid coming from your incision, and stiffness.  Follow instructions from your health care provider about how to take care of your incision.  Use crutches, a walker, or a cane as told by your  health care provider. This information is not intended to replace advice given to you by your health care provider. Make sure you discuss any questions you have with your health care provider. Document Revised: 08/14/2019 Document Reviewed: 08/14/2019 Elsevier Patient Education  2021 [employer]   1) The drugs that you were given will stay in your system until tomorrow so for the next 24 hours you should not:  A) Drive an automobile B) Make any legal decisions C) Drink any alcoholic beverage   2) You may resume regular meals tomorrow.  Today it is better to start with liquids and gradually work up to solid foods.  You may eat anything you prefer, but it is better to start with liquids, then soup and crackers, and gradually work up to solid foods.   3) Please notify your doctor immediately if you have any unusual bleeding, trouble breathing, redness and pain at the surgery site, drainage, fever, or pain not relieved by medication.    4) Additional Instructions:        Please contact your physician with any problems or Same Day Surgery at [phone], Monday through Friday 6 am to 4 pm, or [place] [facility] number at [phone].   Enoxaparin injection What is this medicine? ENOXAPARIN (ee nox a PA rin) is used after knee, hip, or abdominal surgeries to prevent blood clotting. It is also used to treat existing blood clots in the lungs or in the veins. This medicine may be used for other purposes; ask your health care provider or pharmacist if you have questions. COMMON BRAND NAME(S): Lovenox What should I tell my health care provider before I take this medicine? They need to know if you have any of these conditions:  bleeding disorders, hemorrhage, or hemophilia  infection of the heart or heart valves  kidney or liver disease  previous stroke  prosthetic heart valve  recent surgery or delivery of a  baby  ulcer in the stomach or intestine, diverticulitis, or other bowel disease  an unusual or allergic reaction to enoxaparin, heparin, pork or pork products, other medicines, foods, dyes, or preservatives  pregnant or trying to get pregnant  breast-feeding How should I use this medicine? This medicine is for injection under the skin. It is usually given by a health-care professional. You or a family member may be trained on how to give the injections. If you are to give yourself injections, make sure you understand how to use the syringe, measure the dose if necessary, and give the injection. To avoid bruising, do not rub the site where this medicine has been injected. Do not take your medicine more often than directed. Do not stop taking except on the advice of your doctor or health care professional. Make sure you receive a puncture-resistant container to dispose of the needles and syringes once you have finished with them. Do not reuse these items. Return the container to your doctor or health care professional for proper disposal. Talk to your pediatrician regarding the use of this medicine in children. Special care may be needed. Overdosage: If you think you  have taken too much of this medicine contact a poison control center or emergency room at once. NOTE: This medicine is only for you. Do not share this medicine with others. What if I miss a dose? If you miss a dose, take it as soon as you can. If it is almost time for your next dose, take only that dose. Do not take double or extra doses. What may interact with this medicine?  aspirin and aspirin-like medicines  certain medicines that treat or prevent blood clots  dipyridamole  NSAIDs, medicines for pain and inflammation, like ibuprofen or naproxen This list may not describe all possible interactions. Give your health care provider a list of all the medicines, herbs, non-prescription drugs, or dietary supplements you use. Also  tell them if you smoke, drink alcohol, or use illegal drugs. Some items may interact with your medicine. What should I watch for while using this medicine? Visit your healthcare professional for regular checks on your progress. You may need blood work done while you are taking this medicine. Your condition will be monitored carefully while you are receiving this medicine. It is important not to miss any appointments. If you are going to need surgery or other procedure, tell your healthcare professional that you are using this medicine. Using this medicine for a long time may weaken your bones and increase the risk of bone fractures. Avoid sports and activities that might cause injury while you are using this medicine. Severe falls or injuries can cause unseen bleeding. Be careful when using sharp tools or knives. Consider using an Copy. Take special care brushing or flossing your teeth. Report any injuries, bruising, or red spots on the skin to your healthcare professional. Wear a medical ID bracelet or chain. Carry a card that describes your disease and details of your medicine and dosage times. What side effects may I notice from receiving this medicine? Side effects that you should report to your doctor or health care professional as soon as possible:  allergic reactions like skin rash, itching or hives, swelling of the face, lips, or tongue  bone pain  signs and symptoms of bleeding such as bloody or black, tarry stools; red or dark-brown urine; spitting up blood or brown material that looks like coffee grounds; red spots on the skin; unusual bruising or bleeding from the eye, gums, or nose  signs and symptoms of a blood clot such as chest pain; shortness of breath; pain, swelling, or warmth in the leg  signs and symptoms of a stroke such as changes in vision; confusion; trouble speaking or understanding; severe headaches; sudden numbness or weakness of the face, arm or leg; trouble  walking; dizziness; loss of coordination Side effects that usually do not require medical attention (report to your doctor or health care professional if they continue or are bothersome):  hair loss  pain, redness, or irritation at site where injected This list may not describe all possible side effects. Call your doctor for medical advice about side effects. You may report side effects to FDA at 1-800-FDA-1088. Where should I keep my medicine? Keep out of the reach of children. Store at room temperature between 15 and 30 degrees C (59 and 86 degrees F). Do not freeze. If your injections have been specially prepared, you may need to store them in the refrigerator. Ask your pharmacist. Throw away any unused medicine after the expiration date. NOTE: This sheet is a summary. It may not cover all possible information. If you  have questions about this medicine, talk to your doctor, pharmacist, or health care provider.  2021 Elsevier/Gold Standard (2017-02-17 11:25:34)

## 2020-04-02 NOTE — Anesthesia Postprocedure Evaluation (Signed)
Anesthesia Post Note  Patient: Jessica Sullivan  Procedure(s) Performed: TOTAL KNEE ARTHROPLASTY (Left Knee)  Patient location during evaluation: PACU Anesthesia Type: Spinal Level of consciousness: awake and alert and oriented Pain management: pain level controlled Vital Signs Assessment: post-procedure vital signs reviewed and stable Respiratory status: spontaneous breathing Cardiovascular status: blood pressure returned to baseline Anesthetic complications: no   No complications documented.   Last Vitals:  Vitals:   04/01/20 1221 04/01/20 1322  BP: 134/65 138/64  Pulse: (!) 57 67  Resp: 20 20  Temp: 36.6 C   SpO2: 100% 99%    Last Pain:  Vitals:   04/02/20 0837  TempSrc:   PainSc: 5                  Kimberlin Scheel

## 2020-04-03 DIAGNOSIS — Z96653 Presence of artificial knee joint, bilateral: Secondary | ICD-10-CM | POA: Diagnosis not present

## 2020-04-03 DIAGNOSIS — R32 Unspecified urinary incontinence: Secondary | ICD-10-CM | POA: Diagnosis not present

## 2020-04-03 DIAGNOSIS — Z471 Aftercare following joint replacement surgery: Secondary | ICD-10-CM | POA: Diagnosis not present

## 2020-04-03 DIAGNOSIS — Z7901 Long term (current) use of anticoagulants: Secondary | ICD-10-CM | POA: Diagnosis not present

## 2020-04-04 DIAGNOSIS — R32 Unspecified urinary incontinence: Secondary | ICD-10-CM | POA: Diagnosis not present

## 2020-04-04 DIAGNOSIS — Z96653 Presence of artificial knee joint, bilateral: Secondary | ICD-10-CM | POA: Diagnosis not present

## 2020-04-04 DIAGNOSIS — Z7901 Long term (current) use of anticoagulants: Secondary | ICD-10-CM | POA: Diagnosis not present

## 2020-04-04 DIAGNOSIS — Z471 Aftercare following joint replacement surgery: Secondary | ICD-10-CM | POA: Diagnosis not present

## 2020-04-07 DIAGNOSIS — R32 Unspecified urinary incontinence: Secondary | ICD-10-CM | POA: Diagnosis not present

## 2020-04-07 DIAGNOSIS — Z471 Aftercare following joint replacement surgery: Secondary | ICD-10-CM | POA: Diagnosis not present

## 2020-04-07 DIAGNOSIS — Z96653 Presence of artificial knee joint, bilateral: Secondary | ICD-10-CM | POA: Diagnosis not present

## 2020-04-07 DIAGNOSIS — Z7901 Long term (current) use of anticoagulants: Secondary | ICD-10-CM | POA: Diagnosis not present

## 2020-04-10 DIAGNOSIS — Z471 Aftercare following joint replacement surgery: Secondary | ICD-10-CM | POA: Diagnosis not present

## 2020-04-10 DIAGNOSIS — Z7901 Long term (current) use of anticoagulants: Secondary | ICD-10-CM | POA: Diagnosis not present

## 2020-04-10 DIAGNOSIS — Z96653 Presence of artificial knee joint, bilateral: Secondary | ICD-10-CM | POA: Diagnosis not present

## 2020-04-10 DIAGNOSIS — R32 Unspecified urinary incontinence: Secondary | ICD-10-CM | POA: Diagnosis not present

## 2020-04-11 DIAGNOSIS — Z96653 Presence of artificial knee joint, bilateral: Secondary | ICD-10-CM | POA: Diagnosis not present

## 2020-04-11 DIAGNOSIS — Z7901 Long term (current) use of anticoagulants: Secondary | ICD-10-CM | POA: Diagnosis not present

## 2020-04-11 DIAGNOSIS — R32 Unspecified urinary incontinence: Secondary | ICD-10-CM | POA: Diagnosis not present

## 2020-04-11 DIAGNOSIS — Z471 Aftercare following joint replacement surgery: Secondary | ICD-10-CM | POA: Diagnosis not present

## 2020-04-14 DIAGNOSIS — Z96653 Presence of artificial knee joint, bilateral: Secondary | ICD-10-CM | POA: Diagnosis not present

## 2020-04-14 DIAGNOSIS — Z7901 Long term (current) use of anticoagulants: Secondary | ICD-10-CM | POA: Diagnosis not present

## 2020-04-14 DIAGNOSIS — R32 Unspecified urinary incontinence: Secondary | ICD-10-CM | POA: Diagnosis not present

## 2020-04-14 DIAGNOSIS — Z471 Aftercare following joint replacement surgery: Secondary | ICD-10-CM | POA: Diagnosis not present

## 2020-04-18 DIAGNOSIS — R32 Unspecified urinary incontinence: Secondary | ICD-10-CM | POA: Diagnosis not present

## 2020-04-18 DIAGNOSIS — Z471 Aftercare following joint replacement surgery: Secondary | ICD-10-CM | POA: Diagnosis not present

## 2020-04-18 DIAGNOSIS — Z96653 Presence of artificial knee joint, bilateral: Secondary | ICD-10-CM | POA: Diagnosis not present

## 2020-04-18 DIAGNOSIS — Z7901 Long term (current) use of anticoagulants: Secondary | ICD-10-CM | POA: Diagnosis not present

## 2020-04-22 DIAGNOSIS — Z96653 Presence of artificial knee joint, bilateral: Secondary | ICD-10-CM | POA: Diagnosis not present

## 2020-04-22 DIAGNOSIS — Z471 Aftercare following joint replacement surgery: Secondary | ICD-10-CM | POA: Diagnosis not present

## 2020-04-22 DIAGNOSIS — R32 Unspecified urinary incontinence: Secondary | ICD-10-CM | POA: Diagnosis not present

## 2020-04-22 DIAGNOSIS — Z7901 Long term (current) use of anticoagulants: Secondary | ICD-10-CM | POA: Diagnosis not present

## 2020-04-24 DIAGNOSIS — Z471 Aftercare following joint replacement surgery: Secondary | ICD-10-CM | POA: Diagnosis not present

## 2020-04-24 DIAGNOSIS — Z96653 Presence of artificial knee joint, bilateral: Secondary | ICD-10-CM | POA: Diagnosis not present

## 2020-04-24 DIAGNOSIS — Z7901 Long term (current) use of anticoagulants: Secondary | ICD-10-CM | POA: Diagnosis not present

## 2020-04-24 DIAGNOSIS — R32 Unspecified urinary incontinence: Secondary | ICD-10-CM | POA: Diagnosis not present

## 2020-04-29 DIAGNOSIS — Z96653 Presence of artificial knee joint, bilateral: Secondary | ICD-10-CM | POA: Diagnosis not present

## 2020-04-29 DIAGNOSIS — Z7901 Long term (current) use of anticoagulants: Secondary | ICD-10-CM | POA: Diagnosis not present

## 2020-04-29 DIAGNOSIS — Z471 Aftercare following joint replacement surgery: Secondary | ICD-10-CM | POA: Diagnosis not present

## 2020-04-29 DIAGNOSIS — R32 Unspecified urinary incontinence: Secondary | ICD-10-CM | POA: Diagnosis not present

## 2020-05-06 DIAGNOSIS — Z96653 Presence of artificial knee joint, bilateral: Secondary | ICD-10-CM | POA: Diagnosis not present

## 2020-05-06 DIAGNOSIS — R32 Unspecified urinary incontinence: Secondary | ICD-10-CM | POA: Diagnosis not present

## 2020-05-06 DIAGNOSIS — Z471 Aftercare following joint replacement surgery: Secondary | ICD-10-CM | POA: Diagnosis not present

## 2020-05-06 DIAGNOSIS — Z7901 Long term (current) use of anticoagulants: Secondary | ICD-10-CM | POA: Diagnosis not present

## 2020-05-14 DIAGNOSIS — Z96652 Presence of left artificial knee joint: Secondary | ICD-10-CM | POA: Diagnosis not present

## 2020-05-23 ENCOUNTER — Other Ambulatory Visit: Payer: Self-pay

## 2020-05-23 ENCOUNTER — Ambulatory Visit (INDEPENDENT_AMBULATORY_CARE_PROVIDER_SITE_OTHER): Payer: Medicare HMO | Admitting: Internal Medicine

## 2020-05-23 ENCOUNTER — Encounter: Payer: Self-pay | Admitting: Internal Medicine

## 2020-05-23 VITALS — BP 132/78 | HR 70 | Temp 96.9°F | Ht 61.5 in | Wt 135.0 lb

## 2020-05-23 DIAGNOSIS — Z7189 Other specified counseling: Secondary | ICD-10-CM

## 2020-05-23 DIAGNOSIS — Z1211 Encounter for screening for malignant neoplasm of colon: Secondary | ICD-10-CM | POA: Diagnosis not present

## 2020-05-23 DIAGNOSIS — M170 Bilateral primary osteoarthritis of knee: Secondary | ICD-10-CM

## 2020-05-23 DIAGNOSIS — Z0000 Encounter for general adult medical examination without abnormal findings: Secondary | ICD-10-CM | POA: Diagnosis not present

## 2020-05-23 DIAGNOSIS — L900 Lichen sclerosus et atrophicus: Secondary | ICD-10-CM

## 2020-05-23 NOTE — Assessment & Plan Note (Signed)
Now has had both knees done Still some pain---recommended tramadol at night till the pain is better

## 2020-05-23 NOTE — Progress Notes (Signed)
Hearing Screening   125Hz  250Hz  500Hz  1000Hz  2000Hz  3000Hz  4000Hz  6000Hz  8000Hz   Right ear:   25 25 25  25     Left ear:   0 25 25  25     Vision Screening Comments: [facility] 12/2019

## 2020-05-23 NOTE — Assessment & Plan Note (Signed)
See social history 

## 2020-05-23 NOTE — Progress Notes (Signed)
Subjective:    Patient ID: Jessica Sullivan, female    DOB: 09-May-1945, 75 y.o.   MRN: [id number]  HPI Here for Medicare wellness visit and follow up of chronic health conditions This visit occurred during the SARS-CoV-2 public health emergency.  Safety protocols were in place, including screening questions prior to the visit, additional usage of staff PPE, and extensive cleaning of exam room while observing appropriate contact time as indicated for disinfecting solutions.   Reviewed form advanced directives Reviewed other doctors No tobacco Occasional alcohol Does exercise regularly--but rehabbing her knee No falls No depression or anhedonia Vision and hearing are fine Independent with instrumental ADLs  Had the left TKR Difficult course---harder than right Uncomfortable---not sleeping well----has tramadol for prn Now finally doing some better  Does keep up with vascular surgeon--for venous disease Is planning injection therapy with Dr Lucky Cowboy  Uses clobetasol for lichen sclerosis  Current Outpatient Medications on File Prior to Visit  Medication Sig Dispense Refill  . Calcium Carbonate-Vitamin D (CALCIUM + D PO) Take 1 tablet by mouth daily.    . clobetasol ointment (TEMOVATE) 0.05 % APPLY TOPICALLY TWICE A WEEK AS DIRECTED (Patient taking differently: Apply 1 application topically 2 (two) times a week. APPLY TOPICALLY TWICE A WEEK AS DIRECTED) 30 g 2  . cyanocobalamin 100 MCG tablet Take 100 mcg by mouth daily.    . Glucosamine HCl (GLUCOSAMINE PO) Take 1 tablet by mouth 2 (two) times daily.     . Multiple Vitamins-Minerals (CENTRUM SILVER ADULT 50+) TABS Take 1 tablet by mouth daily.    . Multiple Vitamins-Minerals (ICAPS AREDS 2) CAPS Take 1 capsule by mouth 2 (two) times daily.    . Omega-3 Fatty Acids (FISH OIL) 1000 MG CAPS Take 1 capsule by mouth daily.     . traMADol (ULTRAM) 50 MG tablet Take 100 mg by mouth at bedtime.     No current facility-administered medications  on file prior to visit.    Allergies  Allergen Reactions  . Flavoring Agent     Only peach snapps Other reaction(s): Unknown Only peach snapps  . Oxycodone Other (See Comments)    Light headed.  . Poison Ivy Extract Itching and Rash    Past Medical History:  Diagnosis Date  . Chronic venous insufficiency   . History of dysplastic nevus 06/07/2013   left post med thigh/moderate  . Hyperlipidemia   . Lichen sclerosus 30/03/6008   Vulvar biopsy-lichen sclerosus   . Osteoarthritis of both knees 03/16/2017    Past Surgical History:  Procedure Laterality Date  . [place]  . Chin lift  8/14  . DILATION AND CURETTAGE OF UTERUS  1990   year ago- polyp  . HAMMER TOE SURGERY  12/13  . JOINT REPLACEMENT    . KNEE ARTHROSCOPY Right 3/13   meniscus  . MPJ Release 2nd toe left    . TOTAL KNEE ARTHROPLASTY Right 10/11/2017   Procedure: TOTAL KNEE ARTHROPLASTY;  Surgeon: Hessie Knows, MD;  Location: [facility];  Service: Orthopedics;  Laterality: Right;  . TOTAL KNEE ARTHROPLASTY Left 04/01/2020   Procedure: TOTAL KNEE ARTHROPLASTY;  Surgeon: Hessie Knows, MD;  Location: [facility];  Service: Orthopedics;  Laterality: Left;  [facility] VARICOSE VEIN SURGERY  2004, 2011    Family History  Problem Relation Age of Onset  . Dementia Mother   . Cancer Father   . Cancer Brother        prostate/prostate  . Dementia Brother  corticobasilar degeneration  . Parkinson's disease Brother   . Cancer Brother        prostate  . Breast cancer Maternal Aunt   . Breast cancer Cousin   . Heart disease Neg Hx   . Diabetes Neg Hx   . Ovarian cancer Neg Hx   . Colon cancer Neg Hx     Social History   Socioeconomic History  . Marital status: Married    Spouse name: Not on file  . Number of children: 1  . Years of education: Not on file  . Highest education level: Not on file  Occupational History  . 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 Smoking status: Never Smoker  . Smokeless tobacco: Never Used  Vaping Use  . Vaping Use: Never used  Substance and Sexual Activity  . Alcohol use: Yes    Comment: once a week  . Drug use: No  . Sexual activity: Yes    Birth control/protection: Post-menopausal  Other Topics Concern  . Not on file  Social History Narrative   Has living will   Husband is health care POA--- son is alternate   Would accept resuscitation attempts but no prolonged artificial ventilation   No tube feeds if cognitively unaware   Social Determinants of Health   Financial Resource Strain: Not on file  Food Insecurity: Not on file  Transportation Needs: Not on file  Physical Activity: Not on file  Stress: Not on file  Social Connections: Not on file  Intimate Partner Violence: Not on file   Review of Systems Appetite is not great---still recovering from surgery Weight stable Sleeps okay ---when not having the pain Wear seat belt Teeth fine---keeps up with dentist No suspicious skin lesions No chest pain or SOB No dizziness or syncope No edema Occasional heartburn---no Rx. No dysphagia Bowels fine--no blood No urinary symptoms or incontinence No other joint or back pains     Objective:   Physical Exam Constitutional:      Appearance: Normal appearance.  HENT:     Mouth/Throat:     Comments: No lesions Eyes:     Conjunctiva/sclera: Conjunctivae normal.     Pupils: Pupils are equal, round, and reactive to light.  Cardiovascular:     Rate and Rhythm: Normal rate and regular rhythm.     Pulses: Normal pulses.     Heart sounds: No murmur heard. No gallop.   Pulmonary:     Effort: Pulmonary effort is normal.     Breath sounds: Normal breath sounds. No wheezing or rales.  Abdominal:     Palpations: Abdomen is soft.     Tenderness: There is no abdominal tenderness.  Musculoskeletal:     Cervical back: Neck supple.     Right lower leg: No edema.     Left lower  leg: No edema.  Lymphadenopathy:     Cervical: No cervical adenopathy.  Skin:    General: Skin is warm.     Findings: No rash.  Neurological:     Mental Status: She is alert and oriented to person, place, and time.     Comments: President--- "Zoila Shutter, Obama" [id number] Recall 3/3  Psychiatric:        Mood and Affect: Mood normal.        Behavior: Behavior normal.            Assessment & Plan:

## 2020-05-23 NOTE — Assessment & Plan Note (Signed)
Does well with the occasional clobetasol

## 2020-05-23 NOTE — Assessment & Plan Note (Signed)
I have personally reviewed the Medicare Annual Wellness questionnaire and have noted 1. The patient's medical and social history 2. Their use of alcohol, tobacco or illicit drugs 3. Their current medications and supplements 4. The patient's functional ability including ADL's, fall risks, home safety risks and hearing or visual             impairment. 5. Diet and physical activities 6. Evidence for depression or mood disorders  The patients weight, height, BMI and visual acuity have been recorded in the chart I have made referrals, counseling and provided education to the patient based review of the above and I have provided the pt with a written personalized care plan for preventive services.  I have provided you with a copy of your personalized plan for preventive services. Please take the time to review along with your updated medication list.  Needs COVID booster and flu vaccine by the fall Will do FIT--may continue to 80 Mammogram every other year for a few more years Exercises regularly

## 2020-05-28 ENCOUNTER — Other Ambulatory Visit (INDEPENDENT_AMBULATORY_CARE_PROVIDER_SITE_OTHER): Payer: Medicare HMO

## 2020-05-28 DIAGNOSIS — Z1211 Encounter for screening for malignant neoplasm of colon: Secondary | ICD-10-CM | POA: Diagnosis not present

## 2020-05-28 LAB — FECAL OCCULT BLOOD, IMMUNOCHEMICAL: Fecal Occult Bld: NEGATIVE

## 2020-07-23 ENCOUNTER — Ambulatory Visit (INDEPENDENT_AMBULATORY_CARE_PROVIDER_SITE_OTHER): Payer: Medicare HMO | Admitting: Vascular Surgery

## 2020-07-23 DIAGNOSIS — Z0100 Encounter for examination of eyes and vision without abnormal findings: Secondary | ICD-10-CM | POA: Diagnosis not present

## 2020-08-20 ENCOUNTER — Ambulatory Visit (INDEPENDENT_AMBULATORY_CARE_PROVIDER_SITE_OTHER): Payer: Medicare HMO | Admitting: Vascular Surgery

## 2020-08-20 ENCOUNTER — Ambulatory Visit: Payer: Medicare HMO | Admitting: Internal Medicine

## 2020-09-10 ENCOUNTER — Ambulatory Visit (INDEPENDENT_AMBULATORY_CARE_PROVIDER_SITE_OTHER): Payer: Medicare HMO | Admitting: Vascular Surgery

## 2020-09-15 DIAGNOSIS — M1612 Unilateral primary osteoarthritis, left hip: Secondary | ICD-10-CM | POA: Diagnosis not present

## 2020-09-15 DIAGNOSIS — M25552 Pain in left hip: Secondary | ICD-10-CM | POA: Diagnosis not present

## 2020-09-30 ENCOUNTER — Encounter (INDEPENDENT_AMBULATORY_CARE_PROVIDER_SITE_OTHER): Payer: Self-pay | Admitting: Vascular Surgery

## 2020-09-30 ENCOUNTER — Other Ambulatory Visit: Payer: Self-pay

## 2020-09-30 ENCOUNTER — Ambulatory Visit (INDEPENDENT_AMBULATORY_CARE_PROVIDER_SITE_OTHER): Payer: Medicare HMO | Admitting: Vascular Surgery

## 2020-09-30 VITALS — BP 163/79 | HR 79 | Resp 16 | Wt 134.2 lb

## 2020-09-30 DIAGNOSIS — I83813 Varicose veins of bilateral lower extremities with pain: Secondary | ICD-10-CM | POA: Diagnosis not present

## 2020-09-30 NOTE — Progress Notes (Signed)
Jessica Sullivan is a 75 y.o.female who presents with painful varicose veins of the right leg  Past Medical History:  Diagnosis Date   Chronic venous insufficiency    History of dysplastic nevus 06/07/2013   left post med thigh/moderate   Hyperlipidemia    Lichen sclerosus [id number]   Vulvar biopsy-lichen sclerosus    Osteoarthritis of both knees 03/16/2017    Past Surgical History:  Procedure Laterality Date   CESAREAN SECTION  1978   Chin lift  8/14   DILATION AND CURETTAGE OF UTERUS  1990   year ago- polyp   HAMMER TOE SURGERY  12/13   JOINT REPLACEMENT     KNEE ARTHROSCOPY Right 3/13   meniscus   MPJ Release 2nd toe left     TOTAL KNEE ARTHROPLASTY Right 10/11/2017   Procedure: TOTAL KNEE ARTHROPLASTY;  Surgeon: Hessie Knows, MD;  Location: [facility];  Service: Orthopedics;  Laterality: Right;   TOTAL KNEE ARTHROPLASTY Left 04/01/2020   Procedure: TOTAL KNEE ARTHROPLASTY;  Surgeon: Hessie Knows, MD;  Location: [facility];  Service: Orthopedics;  Laterality: Left;   VARICOSE VEIN SURGERY  2004, 2011    Current Outpatient Medications  Medication Sig Dispense Refill   Calcium Carbonate-Vitamin D (CALCIUM + D PO) Take 1 tablet by mouth daily.     clobetasol ointment (TEMOVATE) 0.05 % APPLY TOPICALLY TWICE A WEEK AS DIRECTED (Patient taking differently: Apply 1 application topically 2 (two) times a week. APPLY TOPICALLY TWICE A WEEK AS DIRECTED) 30 g 2   cyanocobalamin 100 MCG tablet Take 100 mcg by mouth daily.     Glucosamine HCl (GLUCOSAMINE PO) Take 1 tablet by mouth 2 (two) times daily.      Multiple Vitamins-Minerals (CENTRUM SILVER ADULT 50+) TABS Take 1 tablet by mouth daily.     Multiple Vitamins-Minerals (ICAPS AREDS 2) CAPS Take 1 capsule by mouth 2 (two) times daily.     Omega-3 Fatty Acids (FISH OIL) 1000 MG CAPS Take 1 capsule by mouth daily.      traMADol (ULTRAM) 50 MG tablet Take 100 mg by mouth at bedtime.     No current facility-administered medications for this  visit.    Allergies  Allergen Reactions   Flavoring Agent     Only peach snapps Other reaction(s): Unknown Only peach snapps   Oxycodone Other (See Comments)    Light headed.   Poison Ivy Extract Itching and Rash    Indication: Patient presents with symptomatic varicose veins of the right lower extremity.  Procedure: Foam sclerotherapy was performed on the right lower extremity. Using ultrasound guidance, 5 mL of foam Sotradecol was used to inject the varicosities of the right lower extremity. Compression wraps were placed. The patient tolerated the procedure well.  The patient has fairly extensive residual varicosities bilaterally and will benefit from further foam sclerotherapy.

## 2020-10-27 ENCOUNTER — Other Ambulatory Visit: Payer: Self-pay | Admitting: Obstetrics and Gynecology

## 2020-10-30 ENCOUNTER — Other Ambulatory Visit: Payer: Self-pay

## 2020-10-30 ENCOUNTER — Encounter: Payer: Self-pay | Admitting: Dermatology

## 2020-10-30 ENCOUNTER — Ambulatory Visit: Payer: Medicare HMO | Admitting: Dermatology

## 2020-10-30 DIAGNOSIS — L820 Inflamed seborrheic keratosis: Secondary | ICD-10-CM

## 2020-10-30 DIAGNOSIS — L578 Other skin changes due to chronic exposure to nonionizing radiation: Secondary | ICD-10-CM | POA: Diagnosis not present

## 2020-10-30 DIAGNOSIS — Z1283 Encounter for screening for malignant neoplasm of skin: Secondary | ICD-10-CM

## 2020-10-30 DIAGNOSIS — D1800 Hemangioma unspecified site: Secondary | ICD-10-CM | POA: Diagnosis not present

## 2020-10-30 DIAGNOSIS — Z86018 Personal history of other benign neoplasm: Secondary | ICD-10-CM

## 2020-10-30 DIAGNOSIS — L814 Other melanin hyperpigmentation: Secondary | ICD-10-CM

## 2020-10-30 DIAGNOSIS — D229 Melanocytic nevi, unspecified: Secondary | ICD-10-CM

## 2020-10-30 DIAGNOSIS — L821 Other seborrheic keratosis: Secondary | ICD-10-CM | POA: Diagnosis not present

## 2020-10-30 NOTE — Progress Notes (Signed)
   Follow-Up Visit   Subjective  Jessica Sullivan is a 75 y.o. female who presents for the following: Annual Exam (History of dysplastic nevus - TBSE today). Accompanied by husband. The patient presents for Total-Body Skin Exam (TBSE) for skin cancer screening and mole check.  The following portions of the chart were reviewed this encounter and updated as appropriate:   Tobacco  Allergies  Meds  Problems  Med Hx  Surg Hx  Fam Hx     Review of Systems:  No other skin or systemic complaints except as noted in HPI or Assessment and Plan.  Objective  Well appearing patient in no apparent distress; mood and affect are within normal limits.  A full examination was performed including scalp, head, eyes, ears, nose, lips, neck, chest, axillae, abdomen, back, buttocks, bilateral upper extremities, bilateral lower extremities, hands, feet, fingers, toes, fingernails, and toenails. All findings within normal limits unless otherwise noted below.  Left upper back x 1, left clavicle x 1 (2) Erythematous keratotic or waxy stuck-on papule or plaque.    Assessment & Plan   History of Dysplastic Nevi - No evidence of recurrence today - Recommend regular full body skin exams - Recommend daily broad spectrum sunscreen SPF 30+ to sun-exposed areas, reapply every 2 hours as needed.  - Call if any new or changing lesions are noted between office visits  Lentigines - Scattered tan macules - Due to sun exposure - Benign-appering, observe - Recommend daily broad spectrum sunscreen SPF 30+ to sun-exposed areas, reapply every 2 hours as needed. - Call for any changes  Seborrheic Keratoses - Stuck-on, waxy, tan-brown papules and/or plaques  - Benign-appearing - Discussed benign etiology and prognosis. - Observe - Call for any changes  Melanocytic Nevi - Tan-brown and/or pink-flesh-colored symmetric macules and papules - Benign appearing on exam today - Observation - Call clinic for new or  changing moles - Recommend daily use of broad spectrum spf 30+ sunscreen to sun-exposed areas.   Hemangiomas - Red papules - Discussed benign nature - Observe - Call for any changes  Actinic Damage - Chronic condition, secondary to cumulative UV/sun exposure - diffuse scaly erythematous macules with underlying dyspigmentation - Recommend daily broad spectrum sunscreen SPF 30+ to sun-exposed areas, reapply every 2 hours as needed.  - Staying in the shade or wearing long sleeves, sun glasses (UVA+UVB protection) and wide brim hats (4-inch brim around the entire circumference of the hat) are also recommended for sun protection.  - Call for new or changing lesions.  Skin cancer screening performed today.  Inflamed seborrheic keratosis Left upper back x 1, left clavicle x 1  Destruction of lesion - Left upper back x 1, left clavicle x 1 Complexity: simple   Destruction method: cryotherapy   Informed consent: discussed and consent obtained   Timeout:  patient name, date of birth, surgical site, and procedure verified Lesion destroyed using liquid nitrogen: Yes   Region frozen until ice ball extended beyond lesion: Yes   Outcome: patient tolerated procedure well with no complications   Post-procedure details: wound care instructions given    Skin cancer screening  Return in about 1 year (around 10/30/2021) for TBSE.  I, Ashok Cordia, CMA, am acting as scribe for Sarina Ser, MD . Documentation: I have reviewed the above documentation for accuracy and completeness, and I agree with the above.  Sarina Ser, MD

## 2020-10-30 NOTE — Patient Instructions (Signed)

## 2020-11-25 ENCOUNTER — Other Ambulatory Visit: Payer: Self-pay

## 2020-11-25 ENCOUNTER — Encounter (INDEPENDENT_AMBULATORY_CARE_PROVIDER_SITE_OTHER): Payer: Self-pay | Admitting: Vascular Surgery

## 2020-11-25 ENCOUNTER — Ambulatory Visit (INDEPENDENT_AMBULATORY_CARE_PROVIDER_SITE_OTHER): Payer: Medicare HMO | Admitting: Vascular Surgery

## 2020-11-25 VITALS — BP 127/77 | HR 68 | Ht 62.0 in | Wt 135.0 lb

## 2020-11-25 DIAGNOSIS — I83813 Varicose veins of bilateral lower extremities with pain: Secondary | ICD-10-CM | POA: Diagnosis not present

## 2020-11-25 NOTE — Progress Notes (Signed)
Jessica Sullivan is a 75 y.o.female who presents with painful varicose veins of the right leg  Past Medical History:  Diagnosis Date   Chronic venous insufficiency    History of dysplastic nevus 06/07/2013   left post med thigh/moderate   Hyperlipidemia    Lichen sclerosus 91/08/3844   Vulvar biopsy-lichen sclerosus    Osteoarthritis of both knees 03/16/2017    Past Surgical History:  Procedure Laterality Date   CESAREAN SECTION  1978   Chin lift  8/14   DILATION AND CURETTAGE OF UTERUS  1990   year ago- polyp   HAMMER TOE SURGERY  12/13   JOINT REPLACEMENT     KNEE ARTHROSCOPY Right 3/13   meniscus   MPJ Release 2nd toe left     TOTAL KNEE ARTHROPLASTY Right 10/11/2017   Procedure: TOTAL KNEE ARTHROPLASTY;  Surgeon: Hessie Knows, MD;  Location: [facility];  Service: Orthopedics;  Laterality: Right;   TOTAL KNEE ARTHROPLASTY Left 04/01/2020   Procedure: TOTAL KNEE ARTHROPLASTY;  Surgeon: Hessie Knows, MD;  Location: [facility];  Service: Orthopedics;  Laterality: Left;   VARICOSE VEIN SURGERY  2004, 2011    Current Outpatient Medications  Medication Sig Dispense Refill   Calcium Carbonate-Vitamin D (CALCIUM + D PO) Take 1 tablet by mouth daily.     clobetasol ointment (TEMOVATE) 0.05 % APPLY TOPICALLY TWICE A WEEK AS NEEDED 45 g 2   cyanocobalamin 100 MCG tablet Take 100 mcg by mouth daily.     Glucosamine HCl (GLUCOSAMINE PO) Take 1 tablet by mouth 2 (two) times daily.      Multiple Vitamins-Minerals (CENTRUM SILVER ADULT 50+) TABS Take 1 tablet by mouth daily.     Multiple Vitamins-Minerals (ICAPS AREDS 2) CAPS Take 1 capsule by mouth 2 (two) times daily.     Omega-3 Fatty Acids (FISH OIL) 1000 MG CAPS Take 1 capsule by mouth daily.      traMADol (ULTRAM) 50 MG tablet Take 100 mg by mouth at bedtime.     No current facility-administered medications for this visit.    Allergies  Allergen Reactions   Flavoring Agent     Only peach snapps Other reaction(s): Unknown Only peach  snapps   Oxycodone Other (See Comments)    Light headed.   Poison Ivy Extract Itching and Rash    Indication: Patient presents with symptomatic varicose veins of the right lower extremity.  Procedure: Foam sclerotherapy was performed on the right lower extremity. Using ultrasound guidance, 5 mL of foam Sotradecol was used to inject the varicosities of the right lower extremity. Compression wraps were placed. The patient tolerated the procedure well.

## 2020-12-01 ENCOUNTER — Other Ambulatory Visit: Payer: Self-pay | Admitting: Internal Medicine

## 2020-12-01 DIAGNOSIS — Z1231 Encounter for screening mammogram for malignant neoplasm of breast: Secondary | ICD-10-CM

## 2020-12-19 ENCOUNTER — Encounter: Payer: Medicare HMO | Admitting: Obstetrics and Gynecology

## 2020-12-23 ENCOUNTER — Other Ambulatory Visit: Payer: Self-pay

## 2020-12-23 ENCOUNTER — Ambulatory Visit (INDEPENDENT_AMBULATORY_CARE_PROVIDER_SITE_OTHER): Payer: Medicare HMO | Admitting: Vascular Surgery

## 2020-12-23 ENCOUNTER — Encounter (INDEPENDENT_AMBULATORY_CARE_PROVIDER_SITE_OTHER): Payer: Self-pay | Admitting: Vascular Surgery

## 2020-12-23 VITALS — BP 144/77 | HR 92 | Resp 16 | Wt 134.4 lb

## 2020-12-23 DIAGNOSIS — I83813 Varicose veins of bilateral lower extremities with pain: Secondary | ICD-10-CM | POA: Diagnosis not present

## 2020-12-23 NOTE — Progress Notes (Signed)
Jessica Sullivan is a 75 y.o.female who presents with painful varicose veins of the right leg  Past Medical History:  Diagnosis Date   Chronic venous insufficiency    History of dysplastic nevus 06/07/2013   left post med thigh/moderate   Hyperlipidemia    Lichen sclerosus 52/09/7822   Vulvar biopsy-lichen sclerosus    Osteoarthritis of both knees 03/16/2017    Past Surgical History:  Procedure Laterality Date   CESAREAN SECTION  1978   Chin lift  8/14   DILATION AND CURETTAGE OF UTERUS  1990   year ago- polyp   HAMMER TOE SURGERY  12/13   JOINT REPLACEMENT     KNEE ARTHROSCOPY Right 3/13   meniscus   MPJ Release 2nd toe left     TOTAL KNEE ARTHROPLASTY Right 10/11/2017   Procedure: TOTAL KNEE ARTHROPLASTY;  Surgeon: Hessie Knows, MD;  Location: [facility];  Service: Orthopedics;  Laterality: Right;   TOTAL KNEE ARTHROPLASTY Left 04/01/2020   Procedure: TOTAL KNEE ARTHROPLASTY;  Surgeon: Hessie Knows, MD;  Location: [facility];  Service: Orthopedics;  Laterality: Left;   VARICOSE VEIN SURGERY  2004, 2011    Current Outpatient Medications  Medication Sig Dispense Refill   Calcium Carbonate-Vitamin D (CALCIUM + D PO) Take 1 tablet by mouth daily.     clobetasol ointment (TEMOVATE) 0.05 % APPLY TOPICALLY TWICE A WEEK AS NEEDED 45 g 2   cyanocobalamin 100 MCG tablet Take 100 mcg by mouth daily.     Glucosamine HCl (GLUCOSAMINE PO) Take 1 tablet by mouth 2 (two) times daily.      Multiple Vitamins-Minerals (CENTRUM SILVER ADULT 50+) TABS Take 1 tablet by mouth daily.     Multiple Vitamins-Minerals (ICAPS AREDS 2) CAPS Take 1 capsule by mouth 2 (two) times daily.     Omega-3 Fatty Acids (FISH OIL) 1000 MG CAPS Take 1 capsule by mouth daily.      traMADol (ULTRAM) 50 MG tablet Take 100 mg by mouth at bedtime.     No current facility-administered medications for this visit.    Allergies  Allergen Reactions   Flavoring Agent     Only peach snapps Other reaction(s): Unknown Only peach  snapps   Oxycodone Other (See Comments)    Light headed.   Poison Ivy Extract Itching and Rash    Indication: Patient presents with symptomatic varicose veins of the right lower extremity.  Procedure: Foam sclerotherapy was performed on the right lower extremity. Using ultrasound guidance, 5 mL of foam Sotradecol was used to inject the varicosities of the right lower extremity. Compression wraps were placed. The patient tolerated the procedure well.

## 2020-12-31 ENCOUNTER — Other Ambulatory Visit: Payer: Self-pay

## 2020-12-31 ENCOUNTER — Ambulatory Visit
Admission: RE | Admit: 2020-12-31 | Discharge: 2020-12-31 | Disposition: A | Discharge status: Home / Self Care | Payer: Medicare HMO | Source: Ambulatory Visit | Attending: Internal Medicine | Admitting: Internal Medicine

## 2020-12-31 DIAGNOSIS — Z1231 Encounter for screening mammogram for malignant neoplasm of breast: Secondary | ICD-10-CM | POA: Insufficient documentation

## 2021-01-20 ENCOUNTER — Ambulatory Visit (INDEPENDENT_AMBULATORY_CARE_PROVIDER_SITE_OTHER): Payer: Medicare HMO | Admitting: Vascular Surgery

## 2021-01-20 ENCOUNTER — Other Ambulatory Visit: Payer: Self-pay

## 2021-01-20 ENCOUNTER — Encounter (INDEPENDENT_AMBULATORY_CARE_PROVIDER_SITE_OTHER): Payer: Self-pay | Admitting: Vascular Surgery

## 2021-01-20 VITALS — BP 125/74 | HR 76 | Ht 62.0 in | Wt 136.0 lb

## 2021-01-20 DIAGNOSIS — I83813 Varicose veins of bilateral lower extremities with pain: Secondary | ICD-10-CM

## 2021-01-20 NOTE — Progress Notes (Signed)
MRN : [id number]  Jessica Sullivan is a 75 y.o. ([birth year]) female who presents with chief complaint of  Chief Complaint  Patient presents with   Follow-up    4 wk  Post sclero  .  History of Present Illness: Patient returns today in follow up of her venous insufficiency status post foam sclerotherapy for extensive varicosities predominantly on the right lower extremity.  These are much better but not entirely resolved.  She does have some on the left leg that are still burning and itching as well.  No chest pain or shortness of breath.  Some of the areas treated have turned into firm knots which I told her was not unexpected.  Current Outpatient Medications  Medication Sig Dispense Refill   Calcium Carbonate-Vitamin D (CALCIUM + D PO) Take 1 tablet by mouth daily.     clobetasol ointment (TEMOVATE) 0.05 % APPLY TOPICALLY TWICE A WEEK AS NEEDED 45 g 2   cyanocobalamin 100 MCG tablet Take 100 mcg by mouth daily.     Glucosamine HCl (GLUCOSAMINE PO) Take 1 tablet by mouth 2 (two) times daily.      Multiple Vitamins-Minerals (CENTRUM SILVER ADULT 50+) TABS Take 1 tablet by mouth daily.     Multiple Vitamins-Minerals (ICAPS AREDS 2) CAPS Take 1 capsule by mouth 2 (two) times daily.     Omega-3 Fatty Acids (FISH OIL) 1000 MG CAPS Take 1 capsule by mouth daily.      traMADol (ULTRAM) 50 MG tablet Take 100 mg by mouth at bedtime.     No current facility-administered medications for this visit.    Past Medical History:  Diagnosis Date   Chronic venous insufficiency    History of dysplastic nevus 06/07/2013   left post med thigh/moderate   Hyperlipidemia    Lichen sclerosus 65/06/6501   Vulvar biopsy-lichen sclerosus    Osteoarthritis of both knees 03/16/2017    Past Surgical History:  Procedure Laterality Date   CESAREAN SECTION  1978   Chin lift  8/14   DILATION AND CURETTAGE OF UTERUS  1990   year ago- polyp   HAMMER TOE SURGERY  12/13   JOINT REPLACEMENT     KNEE ARTHROSCOPY  Right 3/13   meniscus   MPJ Release 2nd toe left     TOTAL KNEE ARTHROPLASTY Right 10/11/2017   Procedure: TOTAL KNEE ARTHROPLASTY;  Surgeon: Hessie Knows, MD;  Location: [facility];  Service: Orthopedics;  Laterality: Right;   TOTAL KNEE ARTHROPLASTY Left 04/01/2020   Procedure: TOTAL KNEE ARTHROPLASTY;  Surgeon: Hessie Knows, MD;  Location: [facility];  Service: Orthopedics;  Laterality: Left;   VARICOSE VEIN SURGERY  2004, 2011     Social History   Tobacco Use   Smoking status: Never   Smokeless tobacco: Never  Vaping Use   Vaping Use: Never used  Substance Use Topics   Alcohol use: Yes    Comment: once a week   Drug use: No      Family History  Problem Relation Age of Onset   Dementia Mother    Cancer Father    Cancer Brother        prostate/prostate   Dementia Brother        corticobasilar degeneration   Parkinson's disease Brother    Cancer Brother        prostate   Breast cancer Maternal Aunt    Breast cancer Cousin    Heart disease Neg Hx    Diabetes Neg Hx  Ovarian cancer Neg Hx    Colon cancer Neg Hx      Allergies  Allergen Reactions   Flavoring Agent     Only peach snapps Other reaction(s): Unknown Only peach snapps   Oxycodone Other (See Comments)    Light headed.   Poison Ivy Extract Itching and Rash     REVIEW OF SYSTEMS (Negative unless checked)  Constitutional: [] Weight loss  [] Fever  [] Chills Cardiac: [] Chest pain   [] Chest pressure   [] Palpitations   [] Shortness of breath when laying flat   [] Shortness of breath at rest   [] Shortness of breath with exertion. Vascular:  [] Pain in legs with walking   [] Pain in legs at rest   [] Pain in legs when laying flat   [] Claudication   [] Pain in feet when walking  [] Pain in feet at rest  [] Pain in feet when laying flat   [] History of DVT   [] Phlebitis   [x] Swelling in legs   [x] Varicose veins   [] Non-healing ulcers Pulmonary:   [] Uses home oxygen   [] Productive cough   [] Hemoptysis   [] Wheeze  [] COPD    [] Asthma Neurologic:  [] Dizziness  [] Blackouts   [] Seizures   [] History of stroke   [] History of TIA  [] Aphasia   [] Temporary blindness   [] Dysphagia   [] Weakness or numbness in arms   [] Weakness or numbness in legs Musculoskeletal:  [] Arthritis   [] Joint swelling   [] Joint pain   [] Low back pain Hematologic:  [] Easy bruising  [] Easy bleeding   [] Hypercoagulable state   [] Anemic   Gastrointestinal:  [] Blood in stool   [] Vomiting blood  [] Gastroesophageal reflux/heartburn   [] Abdominal pain Genitourinary:  [] Chronic kidney disease   [] Difficult urination  [] Frequent urination  [] Burning with urination   [] Hematuria Skin:  [] Rashes   [] Ulcers   [] Wounds Psychological:  [] History of anxiety   []  History of major depression.  Physical Examination  BP 125/74   Pulse 76   Ht 5\' 2"  (1.575 m)   Wt 136 lb (61.7 kg)   BMI 24.87 kg/m  Gen:  WD/WN, NAD.  Appears younger than stated age Head: [place]/AT, No temporalis wasting. Ear/Nose/Throat: Hearing grossly intact, nares w/o erythema or drainage Eyes: Conjunctiva clear. Sclera non-icteric Neck: Supple.  Trachea midline Pulmonary:  Good air movement, no use of accessory muscles.  Cardiac: RRR, no JVD Vascular: Vessel Right Left  Radial Palpable Palpable           Musculoskeletal: M/S 5/5 throughout.  No deformity or atrophy.  Diffuse varicosities bilaterally but markedly reduced in prominence from what they were prior to sclerotherapy.  No significant lower extremity edema this morning. Neurologic: Sensation grossly intact in extremities.  Symmetrical.  Speech is fluent.  Psychiatric: Judgment intact, Mood & affect appropriate for pt's clinical situation. Dermatologic: No rashes or ulcers noted.  No cellulitis or open wounds.      Labs No results found for this or any previous visit (from the past 2160 hour(s)).  Radiology MM 3D SCREEN BREAST BILATERAL  Result Date: 01/02/2021 CLINICAL DATA:  Screening. EXAM: DIGITAL SCREENING BILATERAL  MAMMOGRAM WITH TOMOSYNTHESIS AND CAD TECHNIQUE: Bilateral screening digital craniocaudal and mediolateral oblique mammograms were obtained. Bilateral screening digital breast tomosynthesis was performed. The images were evaluated with computer-aided detection. COMPARISON:  Previous exam(s). ACR Breast Density Category b: There are scattered areas of fibroglandular density. FINDINGS: There are no findings suspicious for malignancy. IMPRESSION: No mammographic evidence of malignancy. A result letter of this screening mammogram will be mailed directly  to the patient. RECOMMENDATION: Screening mammogram in one year. (Code:SM-B-01Y) BI-RADS CATEGORY  1: Negative. Electronically Signed   By: Dorise Bullion III M.D.   On: 01/02/2021 11:50   Assessment/Plan  Varicose veins of leg with pain, bilateral Symptoms are significantly improved at this point.  She is not requiring any additional treatment at current, but she does still have some prominent varicosities and if these worsen over time, we may have to consider repeat foam sclerotherapy to them if she becomes symptomatic.  She will contact our office if they start to bother her more.    Leotis Pain, MD  01/20/2021 9:13 AM    This note was created with Dragon medical transcription system.  Any errors from dictation are purely unintentional

## 2021-01-20 NOTE — Assessment & Plan Note (Signed)
Symptoms are significantly improved at this point.  She is not requiring any additional treatment at current, but she does still have some prominent varicosities and if these worsen over time, we may have to consider repeat foam sclerotherapy to them if she becomes symptomatic.  She will contact our office if they start to bother her more.

## 2021-04-07 DIAGNOSIS — H25013 Cortical age-related cataract, bilateral: Secondary | ICD-10-CM | POA: Diagnosis not present

## 2021-05-27 ENCOUNTER — Other Ambulatory Visit: Payer: Self-pay

## 2021-05-27 ENCOUNTER — Ambulatory Visit (INDEPENDENT_AMBULATORY_CARE_PROVIDER_SITE_OTHER): Payer: Medicare HMO | Admitting: Internal Medicine

## 2021-05-27 ENCOUNTER — Encounter: Payer: Self-pay | Admitting: Internal Medicine

## 2021-05-27 VITALS — BP 132/76 | HR 84 | Temp 97.5°F | Ht 61.5 in | Wt 140.0 lb

## 2021-05-27 DIAGNOSIS — G479 Sleep disorder, unspecified: Secondary | ICD-10-CM

## 2021-05-27 DIAGNOSIS — L900 Lichen sclerosus et atrophicus: Secondary | ICD-10-CM | POA: Diagnosis not present

## 2021-05-27 DIAGNOSIS — M159 Polyosteoarthritis, unspecified: Secondary | ICD-10-CM

## 2021-05-27 DIAGNOSIS — Z1211 Encounter for screening for malignant neoplasm of colon: Secondary | ICD-10-CM

## 2021-05-27 DIAGNOSIS — Z0000 Encounter for general adult medical examination without abnormal findings: Secondary | ICD-10-CM | POA: Diagnosis not present

## 2021-05-27 MED ORDER — CLOBETASOL PROPIONATE 0.05 % EX OINT: TOPICAL_OINTMENT | CUTANEOUS | 2 refills | Status: DC

## 2021-05-27 NOTE — Patient Instructions (Signed)
Please try diclofenac gel and tylenol at bedtime to help the hip pain. ?

## 2021-05-27 NOTE — Progress Notes (Signed)
? ?Subjective:  ? ? Patient ID: Jessica Sullivan, female    DOB: [birth year], 76 y.o.   MRN: [id number] ? ?HPI ?Here for Medicare wellness visit and follow up of chronic health conditions ?Reviewed advanced directives ?Reviewed other doctors---Dr Dew--vascular, Dr Criselda Peaches, Dr Sammuel Bailiff, [facility], Dr Quintella Reichert ?No hospitalizations or surgery in past year ?Vision and hearing are fine ?Occasional alcohol--no tobacco ?No falls ?No depression or anhedonia ?Regular walking--discussed adding resistance ?Independent with instrumental ADLs ?No sig memory issues ? ?Had groin pain after knee replacements---some hip arthritis ?Still having trouble sleeping due to discomfort ?Tried z-quil and something else OTC---not really helpful ?Pain is mostly isolated to left hip ? ?Still sees Dr Lucky Cowboy ?Finished treatments a few months ago--sclerotherapy ?No sig pain now--some itching ?No swelling----does wear compression stockings for travel ? ?Continues with weekly clobetasol for lichen sclerosis ? ?Current Outpatient Medications on File Prior to Visit  ?Medication Sig Dispense Refill  ? Calcium Carbonate-Vitamin D (CALCIUM + D PO) Take 1 tablet by mouth daily.    ? clobetasol ointment (TEMOVATE) 0.05 % APPLY TOPICALLY TWICE A WEEK AS NEEDED 45 g 2  ? cyanocobalamin 100 MCG tablet Take 100 mcg by mouth daily.    ? Glucosamine HCl (GLUCOSAMINE PO) Take 1 tablet by mouth 2 (two) times daily.     ? Multiple Vitamins-Minerals (CENTRUM SILVER ADULT 50+) TABS Take 1 tablet by mouth daily.    ? Multiple Vitamins-Minerals (ICAPS AREDS 2) CAPS Take 1 capsule by mouth 2 (two) times daily.    ? Omega-3 Fatty Acids (FISH OIL) 1000 MG CAPS Take 1 capsule by mouth daily.     ? ?No current facility-administered medications on file prior to visit.  ? ? ?Allergies  ?Allergen Reactions  ? Flavoring Agent   ?  Only peach snapps ?Other reaction(s): Unknown ?Only peach snapps  ? Oxycodone Other (See Comments)  ?  Light headed.  ? Poison Ivy  Extract Itching and Rash  ? ? ?Past Medical History:  ?Diagnosis Date  ? Chronic venous insufficiency   ? History of dysplastic nevus 06/07/2013  ? left post med thigh/moderate  ? Hyperlipidemia   ? Lichen sclerosus 29/09/9890  ? Vulvar biopsy-lichen sclerosus   ? Osteoarthritis of both knees 03/16/2017  ? ? ?Past Surgical History:  ?Procedure Laterality Date  ? [place]  ? Chin lift  8/14  ? DILATION AND CURETTAGE OF UTERUS  1990  ? year ago- polyp  ? HAMMER TOE SURGERY  12/13  ? JOINT REPLACEMENT    ? KNEE ARTHROSCOPY Right 3/13  ? meniscus  ? MPJ Release 2nd toe left    ? TOTAL KNEE ARTHROPLASTY Right 10/11/2017  ? Procedure: TOTAL KNEE ARTHROPLASTY;  Surgeon: Hessie Knows, MD;  Location: [facility];  Service: Orthopedics;  Laterality: Right;  ? TOTAL KNEE ARTHROPLASTY Left 04/01/2020  ? Procedure: TOTAL KNEE ARTHROPLASTY;  Surgeon: Hessie Knows, MD;  Location: [facility];  Service: Orthopedics;  Laterality: Left;  ? VARICOSE VEIN SURGERY  2004, 2011  ? ? ?Family History  ?Problem Relation Age of Onset  ? Dementia Mother   ? Cancer Father   ? Cancer Brother   ?     prostate/prostate  ? Dementia Brother   ?     corticobasilar degeneration  ? Parkinson's disease Brother   ? Cancer Brother   ?     prostate  ? Breast cancer Maternal Aunt   ? Breast cancer Cousin   ? Heart disease Neg Hx   ?  Diabetes Neg Hx   ? Ovarian cancer Neg Hx   ? Colon cancer Neg Hx   ? ? ?Social History  ? ?Socioeconomic History  ? Marital status: Married  ?  Spouse name: Not on file  ? Number of children: 1  ? Years of education: Not on file  ? Highest education level: Not on file  ?Occupational History  ? 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  ?  Comment: briefly, 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  ?Tobacco Use  ? Smoking status: Never  ? Smokeless tobacco: Never  ?Vaping Use  ? Vaping Use: Never used  ?Substance and Sexual Activity  ? Alcohol use: Yes  ?  Comment: once a week  ? Drug use: No  ? Sexual activity: Yes  ?  Birth  control/protection: Post-menopausal  ?Other Topics Concern  ? Not on file  ?Social History Narrative  ? Has living will  ? Husband is health care POA--- son is alternate  ? Would accept resuscitation attempts but no prolonged artificial ventilation  ? No tube feeds if cognitively unaware  ? ?Social Determinants of Health  ? ?Financial Resource Strain: Not on file  ?Food Insecurity: Not on file  ?Transportation Needs: Not on file  ?Physical Activity: Not on file  ?Stress: Not on file  ?Social Connections: Not on file  ?Intimate Partner Violence: Not on file  ? ?Review of Systems ?Appetite is good ?Weight is stable ?Some sleep problems ?Teeth okay---keeps up with dentist ?Wears seat belt ?Rare heartburn---tums help. No dysphagia ?Bowels move fine--no blood ?No dysuria or incontinence ?No other joint or back problems ?No suspicious skin lesions ?No chest pain or SOB ?No dizziness or syncope ?   ?Objective:  ? Physical Exam ?Constitutional:   ?   Appearance: Normal appearance.  ?Cardiovascular:  ?   Rate and Rhythm: Normal rate and regular rhythm.  ?   Pulses: Normal pulses.  ?   Heart sounds: No murmur heard. ?  No gallop.  ?Pulmonary:  ?   Effort: Pulmonary effort is normal.  ?   Breath sounds: Normal breath sounds. No wheezing or rales.  ?Abdominal:  ?   Palpations: Abdomen is soft.  ?   Tenderness: There is no abdominal tenderness.  ?Musculoskeletal:  ?   Cervical back: Neck supple.  ?   Right lower leg: No edema.  ?   Left lower leg: No edema.  ?   Comments: Full ROM in left hip  ?Lymphadenopathy:  ?   Cervical: No cervical adenopathy.  ?Skin: ?   Findings: No lesion or rash.  ?Neurological:  ?   General: No focal deficit present.  ?   Mental Status: She is alert and oriented to person, place, and time.  ?Psychiatric:     ?   Mood and Affect: Mood normal.     ?   Behavior: Behavior normal.  ?  ? ? ? ? ?   ?Assessment & Plan:  ? ?

## 2021-05-27 NOTE — Assessment & Plan Note (Signed)
Left hip pain at night ?Discussed tylenol and diclofenac gel at night ?

## 2021-05-27 NOTE — Assessment & Plan Note (Signed)
Uses the clobetasol weekly ?

## 2021-05-27 NOTE — Assessment & Plan Note (Signed)
I have personally reviewed the Medicare Annual Wellness questionnaire and have noted ?1. The patient's medical and social history ?2. Their use of alcohol, tobacco or illicit drugs ?3. Their current medications and supplements ?4. The patient's functional ability including ADL's, fall risks, home safety risks and hearing or visual ?            impairment. ?5. Diet and physical activities ?6. Evidence for depression or mood disorders ? ?The patients weight, height, BMI and visual acuity have been recorded in the chart ?I have made referrals, counseling and provided education to the patient based review of the above and I have provided the pt with a written personalized care plan for preventive services. ? ?I have provided you with a copy of your personalized plan for preventive services. Please take the time to review along with your updated medication list. ? ?Healthy ?Needs to add resistance training ?COVID booster in the fall if recommended ?Flu vaccine in fall ?Continue mammograms every 2 years till 10 ?FIT ?

## 2021-05-27 NOTE — Assessment & Plan Note (Signed)
Knees are recovered ?Hips not bad enough for action ?Discussed OTC prn Rx ?

## 2021-05-27 NOTE — Progress Notes (Signed)
Hearing Screening - Comments:: Passed whisper test Vision Screening - Comments:: January 2023  

## 2021-05-28 MED ORDER — CLOBETASOL PROPIONATE 0.05 % EX OINT
TOPICAL_OINTMENT | CUTANEOUS | 2 refills | Status: DC
Start: 2021-05-28 — End: 2023-06-27

## 2021-05-28 NOTE — Addendum Note (Signed)
Addended by: Viviana Simpler I on: 05/28/2021 12:48 PM ? ? Modules accepted: Orders ? ?

## 2021-06-01 ENCOUNTER — Other Ambulatory Visit (INDEPENDENT_AMBULATORY_CARE_PROVIDER_SITE_OTHER): Payer: Medicare HMO

## 2021-06-01 DIAGNOSIS — Z1211 Encounter for screening for malignant neoplasm of colon: Secondary | ICD-10-CM

## 2021-06-01 LAB — FECAL OCCULT BLOOD, IMMUNOCHEMICAL: Fecal Occult Bld: NEGATIVE

## 2021-11-05 ENCOUNTER — Encounter: Payer: Self-pay | Admitting: Dermatology

## 2021-11-05 ENCOUNTER — Ambulatory Visit: Payer: Medicare HMO | Admitting: Dermatology

## 2021-11-05 DIAGNOSIS — S90122A Contusion of left lesser toe(s) without damage to nail, initial encounter: Secondary | ICD-10-CM

## 2021-11-05 DIAGNOSIS — Z1283 Encounter for screening for malignant neoplasm of skin: Secondary | ICD-10-CM

## 2021-11-05 DIAGNOSIS — S90121A Contusion of right lesser toe(s) without damage to nail, initial encounter: Secondary | ICD-10-CM | POA: Diagnosis not present

## 2021-11-05 DIAGNOSIS — D229 Melanocytic nevi, unspecified: Secondary | ICD-10-CM

## 2021-11-05 DIAGNOSIS — D692 Other nonthrombocytopenic purpura: Secondary | ICD-10-CM | POA: Diagnosis not present

## 2021-11-05 DIAGNOSIS — L578 Other skin changes due to chronic exposure to nonionizing radiation: Secondary | ICD-10-CM | POA: Diagnosis not present

## 2021-11-05 DIAGNOSIS — D1800 Hemangioma unspecified site: Secondary | ICD-10-CM

## 2021-11-05 DIAGNOSIS — L814 Other melanin hyperpigmentation: Secondary | ICD-10-CM | POA: Diagnosis not present

## 2021-11-05 DIAGNOSIS — Z86018 Personal history of other benign neoplasm: Secondary | ICD-10-CM | POA: Diagnosis not present

## 2021-11-05 DIAGNOSIS — L821 Other seborrheic keratosis: Secondary | ICD-10-CM

## 2021-11-05 NOTE — Patient Instructions (Addendum)
For Acne / Milia - at chin can purchase over the counter (found in acne section)  Differin gel 1 % use nightly to affected areas of face.      Seborrheic Keratosis  What causes seborrheic keratoses? Seborrheic keratoses are harmless, common skin growths that first appear during adult life.  As time goes by, more growths appear.  Some people may develop a large number of them.  Seborrheic keratoses appear on both covered and uncovered body parts.  They are not caused by sunlight.  The tendency to develop seborrheic keratoses can be inherited.  They vary in color from skin-colored to gray, brown, or even black.  They can be either smooth or have a rough, warty surface.   Seborrheic keratoses are superficial and look as if they were stuck on the skin.  Under the microscope this type of keratosis looks like layers upon layers of skin.  That is why at times the top layer may seem to fall off, but the rest of the growth remains and re-grows.    Treatment Seborrheic keratoses do not need to be treated, but can easily be removed in the office.  Seborrheic keratoses often cause symptoms when they rub on clothing or jewelry.  Lesions can be in the way of shaving.  If they become inflamed, they can cause itching, soreness, or burning.  Removal of a seborrheic keratosis can be accomplished by freezing, burning, or surgery. If any spot bleeds, scabs, or grows rapidly, please return to have it checked, as these can be an indication of a skin cancer.     Melanoma ABCDEs  Melanoma is the most dangerous type of skin cancer, and is the leading cause of death from skin disease.  You are more likely to develop melanoma if you: Have light-colored skin, light-colored eyes, or red or blond hair Spend a lot of time in the sun Tan regularly, either outdoors or in a tanning bed Have had blistering sunburns, especially during childhood Have a close family member who has had a melanoma Have atypical moles or large  birthmarks  Early detection of melanoma is key since treatment is typically straightforward and cure rates are extremely high if we catch it early.   The first sign of melanoma is often a change in a mole or a new dark spot.  The ABCDE system is a way of remembering the signs of melanoma.  A for asymmetry:  The two halves do not match. B for border:  The edges of the growth are irregular. C for color:  A mixture of colors are present instead of an even brown color. D for diameter:  Melanomas are usually (but not always) greater than 61m - the size of a pencil eraser. E for evolution:  The spot keeps changing in size, shape, and color.  Please check your skin once per month between visits. You can use a small mirror in front and a large mirror behind you to keep an eye on the back side or your body.   If you see any new or changing lesions before your next follow-up, please call to schedule a visit.  Please continue daily skin protection including broad spectrum sunscreen SPF 30+ to sun-exposed areas, reapplying every 2 hours as needed when you're outdoors.   Staying in the shade or wearing long sleeves, sun glasses (UVA+UVB protection) and wide brim hats (4-inch brim around the entire circumference of the hat) are also recommended for sun protection.     Due  to recent changes in healthcare laws, you may see results of your pathology and/or laboratory studies on MyChart before the doctors have had a chance to review them. We understand that in some cases there may be results that are confusing or concerning to you. Please understand that not all results are received at the same time and often the doctors may need to interpret multiple results in order to provide you with the best plan of care or course of treatment. Therefore, we ask that you please give [place] 2 business days to thoroughly review all your results before contacting the office for clarification. Should we see a critical lab result,  you will be contacted sooner.   If You Need Anything After Your Visit  If you have any questions or concerns for your doctor, please call our main line at [phone] and press option 4 to reach your doctor's medical assistant. If no one answers, please leave a voicemail as directed and we will return your call as soon as possible. Messages left after 4 pm will be answered the following business day.   You may also send [place] a message via [place]. We typically respond to MyChart messages within 1-2 business days.  For prescription refills, please ask your pharmacy to contact our office. Our fax number is [phone].  If you have an urgent issue when the clinic is closed that cannot wait until the next business day, you can page your doctor at the number below.    Please note that while we do our best to be available for urgent issues outside of office hours, we are not available 24/7.   If you have an urgent issue and are unable to reach [place], you may choose to seek medical care at your doctor's office, retail clinic, urgent care center, or emergency room.  If you have a medical emergency, please immediately call 911 or go to the emergency department.  Pager Numbers  - Dr. Nehemiah Massed: [phone]  - Dr. Laurence Ferrari: [phone]  - Dr. Nicole Kindred: [phone]  In the event of inclement weather, please call our main line at [phone] for an update on the status of any delays or closures.  Dermatology Medication Tips: Please keep the boxes that topical medications come in in order to help keep track of the instructions about where and how to use these. Pharmacies typically print the medication instructions only on the boxes and not directly on the medication tubes.   If your medication is too expensive, please contact our office at [phone] option 4 or send [place] a message through [place].   We are unable to tell what your co-pay for medications will be in advance as this is different  depending on your insurance coverage. However, we may be able to find a substitute medication at lower cost or fill out paperwork to get insurance to cover a needed medication.   If a prior authorization is required to get your medication covered by your insurance company, please allow [place] 1-2 business days to complete this process.  Drug prices often vary depending on where the prescription is filled and some pharmacies may offer cheaper prices.  The website www.goodrx.com contains coupons for medications through different pharmacies. The prices here do not account for what the cost may be with help from insurance (it may be cheaper with your insurance), but the website can give you the price if you did not use any insurance.  - You can print the associated coupon and take it with  your prescription to the pharmacy.  - You may also stop by our office during regular business hours and pick up a GoodRx coupon card.  - If you need your prescription sent electronically to a different pharmacy, notify our office through [facility] or by phone at [phone] option 4.     Si Usted Necesita Algo Despus de Su Visita  Tambin puede enviarnos un mensaje a travs de Pharmacist, community. Por lo general respondemos a los mensajes de MyChart en el transcurso de 1 a 2 das hbiles.  Para renovar recetas, por favor pida a su farmacia que se ponga en contacto con nuestra oficina. Harland Dingwall de fax es [place] [phone].  Si tiene un asunto urgente cuando la clnica est cerrada y que no puede esperar hasta el siguiente da hbil, puede llamar/localizar a su doctor(a) al nmero que aparece a continuacin.   Por favor, tenga en cuenta que aunque hacemos todo lo posible para estar disponibles para asuntos urgentes fuera del horario de [place], no estamos disponibles las 24 horas del da, los 7 das de la [place].   Si tiene un problema urgente y no puede comunicarse con nosotros, puede optar por buscar atencin  mdica  en el consultorio de su doctor(a), en una clnica privada, en un centro de atencin urgente o en una sala de emergencias.  Si tiene Engineering geologist, por favor llame inmediatamente al 911 o vaya a la sala de emergencias.  Nmeros de bper  - Dr. Nehemiah Massed: [phone]  - Dra. Moye: [phone]  - Dra. Nicole Kindred: [phone]  En caso de inclemencias del [place], por favor llame a Johnsie Kindred principal al [phone] para una actualizacin sobre el [place] de cualquier retraso o cierre.  Consejos para la medicacin en dermatologa: Por favor, guarde las cajas en las que vienen los medicamentos de uso tpico para ayudarle a seguir las instrucciones sobre dnde y cmo usarlos. Las farmacias generalmente imprimen las instrucciones del medicamento slo en las cajas y no directamente en los tubos del [place].   Si su medicamento es muy caro, por favor, pngase en contacto con Zigmund Daniel llamando al [phone] y presione la opcin 4 o envenos un mensaje a travs de Pharmacist, community.   No podemos decirle cul ser su copago por los medicamentos por adelantado ya que esto es diferente dependiendo de la cobertura de su seguro. Sin embargo, es posible que podamos encontrar un medicamento sustituto a Electrical engineer un formulario para que el seguro cubra el medicamento que se considera necesario.   Si se requiere una autorizacin previa para que su compaa de seguros [place] su medicamento, por favor permtanos de 1 a 2 das hbiles para completar este proceso.  Los precios de los medicamentos varan con frecuencia dependiendo del Environmental consultant de dnde se surte la receta y alguna farmacias pueden ofrecer precios ms baratos.  El sitio web www.goodrx.com tiene cupones para medicamentos de Airline pilot. Los precios aqu no tienen en cuenta lo que podra costar con la ayuda del seguro (puede ser ms barato con su seguro), pero el sitio web puede darle el precio si no utiliz Conservation officer, historic buildings.  - Puede imprimir el cupn correspondiente y llevarlo con su receta a la farmacia.  - Tambin puede pasar por nuestra oficina durante el horario de atencin regular y Charity fundraiser una tarjeta de cupones de GoodRx.  - Si necesita que su receta se enve electrnicamente a Chiropodist, informe a nuestra oficina a travs [place]  o por telfono llamando al [phone] y presione la opcin 4.  

## 2021-11-05 NOTE — Progress Notes (Signed)
Follow-Up Visit   Subjective  Jessica Sullivan is a 76 y.o. female who presents for the following: Annual Exam (1 year tbse. Hx of dysplastic nevus, isks, neurofibroma. Reports a spot at left great toe she would like checked. ). The patient presents for Total-Body Skin Exam (TBSE) for skin cancer screening and mole check.  The patient has spots, moles and lesions to be evaluated, some may be new or changing and the patient has concerns that these could be cancer.  The following portions of the chart were reviewed this encounter and updated as appropriate:  Tobacco  Allergies  Meds  Problems  Med Hx  Surg Hx  Fam Hx     Review of Systems: No other skin or systemic complaints except as noted in HPI or Assessment and Plan.   Objective  Well appearing patient in no apparent distress; mood and affect are within normal limits.  A full examination was performed including scalp, head, eyes, ears, nose, lips, neck, chest, axillae, abdomen, back, buttocks, bilateral upper extremities, bilateral lower extremities, hands, feet, fingers, toes, fingernails, and toenails. All findings within normal limits unless otherwise noted below.   Assessment & Plan  Lentigines - Scattered tan macules - Due to sun exposure - Benign-appearing, observe - Recommend daily broad spectrum sunscreen SPF 30+ to sun-exposed areas, reapply every 2 hours as needed. - Call for any changes  Seborrheic Keratoses - Stuck-on, waxy, tan-brown papules and/or plaques  - Benign-appearing - Discussed benign etiology and prognosis. - Observe - Call for any changes  Melanocytic Nevi - Tan-brown and/or pink-flesh-colored symmetric macules and papules - Benign appearing on exam today - Observation - Call clinic for new or changing moles - Recommend daily use of broad spectrum spf 30+ sunscreen to sun-exposed areas.   Varicose Veins/Spider Veins  - Dilated blue, purple or red veins at the lower extremities -  Reassured - Smaller vessels can be treated by sclerotherapy (a procedure to inject a medicine into the veins to make them disappear) if desired, but the treatment is not covered by insurance. Larger vessels may be covered if symptomatic and we would refer to vascular surgeon if treatment desired.  Purpura - Chronic; persistent and recurrent.  Treatable, but not curable. At arms  - Violaceous macules and patches - Benign - Related to trauma, age, sun damage and/or use of blood thinners, chronic use of topical and/or oral steroids - Observe - Can use OTC arnica containing moisturizer such as Dermend Bruise Formula if desired - Call for worsening or other concerns  Hemangiomas - Red papules - Discussed benign nature - Observe - Call for any changes  Actinic Damage - Chronic condition, secondary to cumulative UV/sun exposure - diffuse scaly erythematous macules with underlying dyspigmentation - Recommend daily broad spectrum sunscreen SPF 30+ to sun-exposed areas, reapply every 2 hours as needed.  - Staying in the shade or wearing long sleeves, sun glasses (UVA+UVB protection) and wide brim hats (4-inch brim around the entire circumference of the hat) are also recommended for sun protection.  - Call for new or changing lesions.  History of Dysplastic Nevi 2015 at left post med thigh/moderate - No evidence of recurrence today - Recommend regular full body skin exams - Recommend daily broad spectrum sunscreen SPF 30+ to sun-exposed areas, reapply every 2 hours as needed.  - Call if any new or changing lesions are noted between office visits  Traumatic blood on the toe from recent injury. Should resolve in the next few  weeks. If not resolved return for reevaluation. Benign-appearing.  Observation.  Call clinic for new or changing lesions.  Recommend daily use of broad spectrum spf 30+ sunscreen to sun-exposed areas.   Skin cancer screening performed today.  Return in about 1 year  (around 11/06/2022) for TBSE. IRuthell Rummage, CMA, am acting as scribe for Sarina Ser, MD. Documentation: I have reviewed the above documentation for accuracy and completeness, and I agree with the above.  Sarina Ser, MD

## 2021-12-14 ENCOUNTER — Telehealth: Payer: Self-pay | Admitting: *Deleted

## 2021-12-14 NOTE — Patient Outreach (Signed)
  Care Coordination   Initial Visit Note   12/14/2021 Name: KATRINNA TRAVIESO MRN: [id number] DOB: 03-Aug-1945  Lauraine Rinne is a 76 y.o. year old female who sees Venia Carbon, MD for primary care. I spoke with  Lauraine Rinne by phone today.  What matters to the patients health and wellness today?  No health concerns voiced. RN discussed [place], RN, SW, and Pharmacist. Patient declined services.     Goals Addressed             This Visit's Progress    Advised patient to follow up with AWV and Vaccines          SDOH assessments and interventions completed:  Yes     Care Coordination Interventions Activated:  Yes  Care Coordination Interventions:  Yes, provided   Follow up plan: No further intervention required.   Encounter Outcome:  Pt. [place] [facility] Management [phone]

## 2022-01-04 ENCOUNTER — Encounter (INDEPENDENT_AMBULATORY_CARE_PROVIDER_SITE_OTHER): Payer: Self-pay

## 2022-03-26 DIAGNOSIS — H2512 Age-related nuclear cataract, left eye: Secondary | ICD-10-CM | POA: Diagnosis not present

## 2022-03-26 DIAGNOSIS — H353131 Nonexudative age-related macular degeneration, bilateral, early dry stage: Secondary | ICD-10-CM | POA: Diagnosis not present

## 2022-03-26 DIAGNOSIS — H2511 Age-related nuclear cataract, right eye: Secondary | ICD-10-CM | POA: Diagnosis not present

## 2022-04-12 DIAGNOSIS — H2511 Age-related nuclear cataract, right eye: Secondary | ICD-10-CM | POA: Diagnosis not present

## 2022-04-21 ENCOUNTER — Encounter: Payer: Self-pay | Admitting: Ophthalmology

## 2022-04-26 NOTE — Discharge Instructions (Signed)

## 2022-04-27 ENCOUNTER — Other Ambulatory Visit: Payer: Self-pay

## 2022-04-27 ENCOUNTER — Ambulatory Visit: Payer: Medicare HMO | Admitting: Anesthesiology

## 2022-04-27 ENCOUNTER — Encounter
Admission: RE | Disposition: A | Discharge status: Home / Self Care | Payer: Self-pay | Source: Home / Self Care | Attending: Ophthalmology

## 2022-04-27 ENCOUNTER — Encounter: Payer: Self-pay | Admitting: Ophthalmology

## 2022-04-27 ENCOUNTER — Ambulatory Visit
Admission: RE | Admit: 2022-04-27 | Discharge: 2022-04-27 | Disposition: A | Discharge status: Home / Self Care | Payer: Medicare HMO | Attending: Ophthalmology | Admitting: Ophthalmology

## 2022-04-27 DIAGNOSIS — I872 Venous insufficiency (chronic) (peripheral): Secondary | ICD-10-CM | POA: Insufficient documentation

## 2022-04-27 DIAGNOSIS — M170 Bilateral primary osteoarthritis of knee: Secondary | ICD-10-CM | POA: Diagnosis not present

## 2022-04-27 DIAGNOSIS — T7840XA Allergy, unspecified, initial encounter: Secondary | ICD-10-CM | POA: Diagnosis not present

## 2022-04-27 DIAGNOSIS — H2512 Age-related nuclear cataract, left eye: Secondary | ICD-10-CM | POA: Diagnosis not present

## 2022-04-27 DIAGNOSIS — E785 Hyperlipidemia, unspecified: Secondary | ICD-10-CM | POA: Diagnosis not present

## 2022-04-27 HISTORY — DX: Presence of spectacles and contact lenses: Z97.3

## 2022-04-27 HISTORY — PX: CATARACT EXTRACTION W/PHACO: SHX586

## 2022-04-27 SURGERY — PHACOEMULSIFICATION, CATARACT, WITH IOL INSERTION
Anesthesia: Monitor Anesthesia Care | Site: Eye | Laterality: Left

## 2022-04-27 MED ORDER — BRIMONIDINE TARTRATE-TIMOLOL 0.2-0.5 % OP SOLN
OPHTHALMIC | Status: DC | PRN
  Administered 2022-04-27: 1 [drp] via OPHTHALMIC

## 2022-04-27 MED ORDER — MOXIFLOXACIN HCL 0.5 % OP SOLN
OPHTHALMIC | Status: DC | PRN
  Administered 2022-04-27: .2 mL via OPHTHALMIC

## 2022-04-27 MED ORDER — FENTANYL CITRATE (PF) 100 MCG/2ML IJ SOLN
INTRAMUSCULAR | Status: DC | PRN
  Administered 2022-04-27 (×2): 50 ug via INTRAVENOUS

## 2022-04-27 MED ORDER — TETRACAINE HCL 0.5 % OP SOLN
1.0000 [drp] | OPHTHALMIC | Status: DC | PRN
  Administered 2022-04-27 (×3): 1 [drp] via OPHTHALMIC

## 2022-04-27 MED ORDER — ARMC OPHTHALMIC DILATING DROPS
1.0000 | OPHTHALMIC | Status: DC | PRN
  Administered 2022-04-27 (×3): 1 via OPHTHALMIC

## 2022-04-27 MED ORDER — LACTATED RINGERS IV SOLN
INTRAVENOUS | Status: DC
Start: 2022-04-27 — End: 2022-04-27

## 2022-04-27 MED ORDER — SIGHTPATH DOSE#1 BSS IO SOLN
INTRAOCULAR | Status: DC | PRN
  Administered 2022-04-27: 15 mL via INTRAOCULAR

## 2022-04-27 MED ORDER — SIGHTPATH DOSE#1 NA CHONDROIT SULF-NA HYALURON 40-17 MG/ML IO SOLN
INTRAOCULAR | Status: DC | PRN
  Administered 2022-04-27: 1 mL via INTRAOCULAR

## 2022-04-27 MED ORDER — SIGHTPATH DOSE#1 BSS IO SOLN
INTRAOCULAR | Status: DC | PRN
  Administered 2022-04-27: 85 mL via OPHTHALMIC

## 2022-04-27 MED ORDER — SIGHTPATH DOSE#1 BSS IO SOLN
INTRAOCULAR | Status: DC | PRN
  Administered 2022-04-27: 2 mL

## 2022-04-27 MED ORDER — MIDAZOLAM HCL 2 MG/2ML IJ SOLN
INTRAMUSCULAR | Status: DC | PRN
  Administered 2022-04-27: 1 mg via INTRAVENOUS

## 2022-04-27 SURGICAL SUPPLY — 12 items
CANNULA ANT/CHMB 27G (MISCELLANEOUS) IMPLANT
CANNULA ANT/CHMB 27GA (MISCELLANEOUS) IMPLANT
CATARACT SUITE SIGHTPATH (MISCELLANEOUS) ×1 IMPLANT
FEE CATARACT SUITE SIGHTPATH (MISCELLANEOUS) ×1 IMPLANT
GLOVE BIOGEL PI IND STRL 8 (GLOVE) ×1 IMPLANT
GLOVE SURG ENC TEXT LTX SZ8 (GLOVE) ×1 IMPLANT
LENS CLAREON VIVITY TORIC 14.5 ×1 IMPLANT
LENS IOL CLRN VT TRC 4 14.5 IMPLANT
NDL FILTER BLUNT 18X1 1/2 (NEEDLE) ×1 IMPLANT
NEEDLE FILTER BLUNT 18X1 1/2 (NEEDLE) ×1 IMPLANT
SYR 3ML LL SCALE MARK (SYRINGE) ×1 IMPLANT
WATER STERILE IRR 250ML POUR (IV SOLUTION) ×1 IMPLANT

## 2022-04-27 NOTE — Anesthesia Preprocedure Evaluation (Signed)
Anesthesia Evaluation  Patient identified by MRN, date of birth, ID band Patient awake    Reviewed: Allergy & Precautions, NPO status , Patient's Chart, lab work & pertinent test results  History of Anesthesia Complications Negative for: history of anesthetic complications  Airway Mallampati: II       Dental  (+) Dental Advidsory Given   Pulmonary neg pulmonary ROS, neg sleep apnea, neg COPD, Not current smoker          Cardiovascular (-) hypertension(-) Past MI and (-) CHF negative cardio ROS (-) dysrhythmias (-) Valvular Problems/Murmurs     Neuro/Psych negative neurological ROS  negative psych ROS   GI/Hepatic negative GI ROS, Neg liver ROS,neg GERD  ,,  Endo/Other  neg diabetes    Renal/GU negative Renal ROS  negative genitourinary   Musculoskeletal  (+) Arthritis ,    Abdominal   Peds negative pediatric ROS (+)  Hematology   Anesthesia Other Findings Past Medical History: No date: Chronic venous insufficiency 06/07/2013: History of dysplastic nevus     Comment:  left post med thigh/moderate No date: Hyperlipidemia [id number]: Lichen sclerosus     Comment:  Vulvar biopsy-lichen sclerosus  0000000: Osteoarthritis of both knees No date: Wears contact lenses   Reproductive/Obstetrics                             Anesthesia Physical Anesthesia Plan  ASA: 1  Anesthesia Plan: MAC   Post-op Pain Management:    Induction: Intravenous  PONV Risk Score and Plan: 2 and Midazolam and Treatment may vary due to age or medical condition  Airway Management Planned: Nasal Cannula  Additional Equipment:   Intra-op Plan:   Post-operative Plan:   Informed Consent: I have reviewed the patients History and Physical, chart, labs and discussed the procedure including the risks, benefits and alternatives for the proposed anesthesia with the patient or authorized representative who has  indicated his/her understanding and acceptance.       Plan Discussed with: CRNA and Surgeon  Anesthesia Plan Comments:         Anesthesia Quick Evaluation

## 2022-04-27 NOTE — Transfer of Care (Signed)
Immediate Anesthesia Transfer of Care Note  Patient: Jessica Sullivan  Procedure(s) Performed: CATARACT EXTRACTION PHACO AND INTRAOCULAR LENS PLACEMENT (IOC) LEFT CLAREON VIVITY toric lens 11.65 01:17.6 (Left: Eye)  Patient Location: PACU  Anesthesia Type: MAC  Level of Consciousness: awake, alert  and patient cooperative  Airway and Oxygen Therapy: Patient Spontanous Breathing and Patient connected to supplemental oxygen  Post-op Assessment: Post-op Vital signs reviewed, Patient's Cardiovascular Status Stable, Respiratory Function Stable, Patent Airway and No signs of Nausea or vomiting  Post-op Vital Signs: Reviewed and stable  Complications: No notable events documented.

## 2022-04-27 NOTE — Anesthesia Postprocedure Evaluation (Signed)
Anesthesia Post Note  Patient: Jessica Sullivan  Procedure(s) Performed: CATARACT EXTRACTION PHACO AND INTRAOCULAR LENS PLACEMENT (IOC) LEFT CLAREON VIVITY toric lens 11.65 01:17.6 (Left: Eye)  Patient location during evaluation: PACU Anesthesia Type: MAC Level of consciousness: awake and alert Pain management: pain level controlled Vital Signs Assessment: post-procedure vital signs reviewed and stable Respiratory status: spontaneous breathing, nonlabored ventilation, respiratory function stable and patient connected to nasal cannula oxygen Cardiovascular status: stable and blood pressure returned to baseline Postop Assessment: no apparent nausea or vomiting Anesthetic complications: no   No notable events documented.   Last Vitals:  Vitals:   04/27/22 0836 04/27/22 0839  BP: (!) 143/77 137/73  Pulse: 65 61  Resp: 14 17  Temp: (!) 36.2 C (!) 36.2 C  SpO2: 96% 95%    Last Pain:  Vitals:   04/27/22 0839  TempSrc:   PainSc: 0-No pain                 Martha Clan

## 2022-04-27 NOTE — H&P (Signed)
[facility]   Primary Care Physician:  Venia Carbon, MD Ophthalmologist: Dr. George Ina  Pre-Procedure History & Physical: HPI:  Jessica Sullivan is a 77 y.o. female here for cataract surgery.   Past Medical History:  Diagnosis Date   Chronic venous insufficiency    History of dysplastic nevus 06/07/2013   left post med thigh/moderate   Hyperlipidemia    Lichen sclerosus 123XX123   Vulvar biopsy-lichen sclerosus    Osteoarthritis of both knees 03/16/2017   Wears contact lenses     Past Surgical History:  Procedure Laterality Date   CESAREAN SECTION  1978   Chin lift  8/14   DILATION AND CURETTAGE OF UTERUS  1990   year ago- polyp   HAMMER TOE SURGERY  12/13   JOINT REPLACEMENT     KNEE ARTHROSCOPY Right 3/13   meniscus   MPJ Release 2nd toe left     TOTAL KNEE ARTHROPLASTY Right 10/11/2017   Procedure: TOTAL KNEE ARTHROPLASTY;  Surgeon: Hessie Knows, MD;  Location: [facility] ORS;  Service: Orthopedics;  Laterality: Right;   TOTAL KNEE ARTHROPLASTY Left 04/01/2020   Procedure: TOTAL KNEE ARTHROPLASTY;  Surgeon: Hessie Knows, MD;  Location: [facility] ORS;  Service: Orthopedics;  Laterality: Left;   VARICOSE VEIN SURGERY  2004, 2011    Prior to Admission medications   Medication Sig Start Date End Date Taking? Authorizing Provider  Calcium Carbonate-Vitamin D (CALCIUM + D PO) Take 1 tablet by mouth daily.   Yes [provider]  clobetasol ointment (TEMOVATE) 0.05 % APPLY TOPICALLY TWICE A WEEK AS NEEDED 05/28/21  Yes Venia Carbon, MD  Multiple Vitamins-Minerals (CENTRUM SILVER ADULT 50+) TABS Take 1 tablet by mouth daily.   Yes [provider]  Multiple Vitamins-Minerals (ICAPS AREDS 2) CAPS Take 1 capsule by mouth 2 (two) times daily.   Yes [provider]  Omega-3 Fatty Acids (FISH OIL) 1000 MG CAPS Take 1 capsule by mouth daily.    Yes [provider]  OVER THE COUNTER MEDICATION Juicefestiv - (1)fruit & (1)veggie   Yes [provider]    Allergies as of 03/31/2022 - Review Complete 11/05/2021  Allergen Reaction Noted   Flavoring agent  09/28/2017   Oxycodone Other (See Comments) 09/28/2017   Poison ivy extract Itching and Rash     Family History  Problem Relation Age of Onset   Dementia Mother    Cancer Father    Cancer Brother        prostate/prostate   Dementia Brother        corticobasilar degeneration   Parkinson's disease Brother    Cancer Brother        prostate   Breast cancer Maternal Aunt    Breast cancer Cousin    Heart disease Neg Hx    Diabetes Neg Hx    Ovarian cancer Neg Hx    Colon cancer Neg Hx     Social History   Socioeconomic History   Marital status: Married    Spouse name: Not on file   Number of children: 1   Years of education: Not on file   Highest education level: Not on file  Occupational History   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: briefly, 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  Tobacco Use   Smoking status: Never   Smokeless tobacco: Never  Vaping Use   Vaping Use: Never used  Substance and Sexual Activity   Alcohol use: Yes  Comment: once a week   Drug use: No   Sexual activity: Yes    Birth control/protection: Post-menopausal  Other Topics Concern   Not on file  Social History Narrative   Has living will   Husband is health care POA--- son is alternate   Would accept resuscitation attempts but no prolonged artificial ventilation   No tube feeds if cognitively unaware   Social Determinants of Health   Financial Resource Strain: Not on file  Food Insecurity: Not on file  Transportation Needs: Not on file  Physical Activity: Not on file  Stress: Not on file  Social Connections: Not on file  Intimate Partner Violence: Not on file    Review of Systems: See HPI, otherwise negative ROS  Physical Exam: BP (!) 152/71   Pulse 80   Temp 98.7 F (37.1 C) (Temporal)   Resp 12   Ht 5' 1"$  (1.549 m)   Wt 64.5 kg   SpO2 98%    BMI 26.89 kg/m  General:   Alert, cooperative in NAD Head:  Normocephalic and atraumatic. Respiratory:  Normal work of breathing. Cardiovascular:  RRR  Impression/Plan: Jessica Sullivan is here for cataract surgery.  Risks, benefits, limitations, and alternatives regarding cataract surgery have been reviewed with the patient.  Questions have been answered.  All parties agreeable.   Birder Robson, MD  04/27/2022, 8:10 AM

## 2022-04-27 NOTE — Op Note (Signed)
PREOPERATIVE DIAGNOSIS:  Nuclear sclerotic cataract of the left eye.   POSTOPERATIVE DIAGNOSIS:  Nuclear sclerotic cataract of the left eye.   OPERATIVE PROCEDURE: Procedure(s): CATARACT EXTRACTION PHACO AND INTRAOCULAR LENS PLACEMENT ([facility]) LEFT CLAREON VIVITY toric lens 11.65 01:17.6   SURGEON:  Birder Robson, MD.   ANESTHESIA: 1.      Managed anesthesia care. 2.     0.83m os Shugarcaine was instilled following the paracentesis [email or web address]@   COMPLICATIONS:  None.   TECHNIQUE:   Stop and chop    DESCRIPTION OF PROCEDURE:  The patient was examined and consented in the preoperative holding area where the aforementioned topical anesthesia was applied to the left eye.  The patient was brought back to the Operating Room where he was sat upright on the gurney and given a target to fixate upon while the eye was marked at the 3:00 and 9:00 position.  The patient was then reclined on the operating table.  The eye was prepped and draped in the usual sterile ophthalmic fashion and a lid speculum was placed. A paracentesis was created with the side port blade and the anterior chamber was filled with viscoelastic. A near clear corneal incision was performed with the steel keratome. A continuous curvilinear capsulorrhexis was performed with a cystotome followed by the capsulorrhexis forceps. Hydrodissection and hydrodelineation were carried out with BSS on a blunt cannula. The lens was removed in a stop and chop technique and the remaining cortical material was removed with the irrigation-aspiration handpiece. The eye was inflated with viscoelastic and the ZCT lens was placed in the eye and rotated to within a few degrees of the predetermined orientation.  The remaining viscoelastic was removed from the eye.  The Sinskey hook was used to rotate the toric lens into its final resting place at 158 degrees.  0.1 ml of Vigamox was placed in the anterior chamber. The eye was inflated to a physiologic pressure and  found to be watertight.  The eye was dressed with Vigamox and Combigan. The patient was given protective glasses to wear throughout the day and a shield with which to sleep tonight. The patient was also given drops with which to begin a drop regimen today and will follow-up with me in one day. Implant Name Type Inv. Item Serial No. Manufacturer Lot No. LRB No. Used Action  Clareon Vivity Toric IOL 14.5D   [id number]  Left 1 Implanted   Procedure(s): CATARACT EXTRACTION PHACO AND INTRAOCULAR LENS PLACEMENT ([facility]) LEFT CLAREON VIVITY toric lens 11.65 01:17.6 (Left)  Electronically signed: WBirder Robson2/20/20248:36 AM

## 2022-04-28 ENCOUNTER — Encounter: Payer: Self-pay | Admitting: Ophthalmology

## 2022-04-28 ENCOUNTER — Other Ambulatory Visit: Payer: Self-pay

## 2022-05-03 DIAGNOSIS — H2511 Age-related nuclear cataract, right eye: Secondary | ICD-10-CM | POA: Diagnosis not present

## 2022-05-04 ENCOUNTER — Encounter: Payer: Self-pay | Admitting: Ophthalmology

## 2022-05-06 NOTE — Discharge Instructions (Signed)

## 2022-05-11 ENCOUNTER — Ambulatory Visit: Payer: Medicare HMO | Admitting: Anesthesiology

## 2022-05-11 ENCOUNTER — Other Ambulatory Visit: Payer: Self-pay

## 2022-05-11 ENCOUNTER — Ambulatory Visit
Admission: RE | Admit: 2022-05-11 | Discharge: 2022-05-11 | Disposition: A | Discharge status: Home / Self Care | Payer: Medicare HMO | Attending: Ophthalmology | Admitting: Ophthalmology

## 2022-05-11 ENCOUNTER — Encounter: Payer: Self-pay | Admitting: Ophthalmology

## 2022-05-11 ENCOUNTER — Encounter
Admission: RE | Disposition: A | Discharge status: Home / Self Care | Payer: Self-pay | Source: Home / Self Care | Attending: Ophthalmology

## 2022-05-11 DIAGNOSIS — H2511 Age-related nuclear cataract, right eye: Secondary | ICD-10-CM | POA: Diagnosis not present

## 2022-05-11 DIAGNOSIS — I10 Essential (primary) hypertension: Secondary | ICD-10-CM | POA: Insufficient documentation

## 2022-05-11 DIAGNOSIS — I872 Venous insufficiency (chronic) (peripheral): Secondary | ICD-10-CM | POA: Diagnosis not present

## 2022-05-11 DIAGNOSIS — E119 Type 2 diabetes mellitus without complications: Secondary | ICD-10-CM | POA: Diagnosis not present

## 2022-05-11 DIAGNOSIS — E785 Hyperlipidemia, unspecified: Secondary | ICD-10-CM | POA: Insufficient documentation

## 2022-05-11 HISTORY — PX: CATARACT EXTRACTION W/PHACO: SHX586

## 2022-05-11 SURGERY — PHACOEMULSIFICATION, CATARACT, WITH IOL INSERTION
Anesthesia: Topical | Site: Eye | Laterality: Right

## 2022-05-11 MED ORDER — FENTANYL CITRATE PF 50 MCG/ML IJ SOSY: 25.0000 ug | PREFILLED_SYRINGE | INTRAMUSCULAR | Status: DC | PRN

## 2022-05-11 MED ORDER — MIDAZOLAM HCL 2 MG/2ML IJ SOLN
INTRAMUSCULAR | Status: DC | PRN
  Administered 2022-05-11 (×2): 1 mg via INTRAVENOUS

## 2022-05-11 MED ORDER — SIGHTPATH DOSE#1 NA CHONDROIT SULF-NA HYALURON 40-17 MG/ML IO SOLN
INTRAOCULAR | Status: DC | PRN
  Administered 2022-05-11: 1 mL via INTRAOCULAR

## 2022-05-11 MED ORDER — DROPERIDOL 2.5 MG/ML IJ SOLN: 0.6250 mg | Freq: Once | INTRAMUSCULAR | Status: DC | PRN

## 2022-05-11 MED ORDER — MOXIFLOXACIN HCL 0.5 % OP SOLN
OPHTHALMIC | Status: DC | PRN
  Administered 2022-05-11: .2 mL via OPHTHALMIC

## 2022-05-11 MED ORDER — ARMC OPHTHALMIC DILATING DROPS
1.0000 | OPHTHALMIC | Status: DC | PRN
  Administered 2022-05-11 (×3): 1 via OPHTHALMIC

## 2022-05-11 MED ORDER — OXYCODONE HCL 5 MG PO TABS: 5.0000 mg | ORAL_TABLET | Freq: Once | ORAL | Status: DC | PRN

## 2022-05-11 MED ORDER — SIGHTPATH DOSE#1 BSS IO SOLN
INTRAOCULAR | Status: DC | PRN
  Administered 2022-05-11: 15 mL via INTRAOCULAR

## 2022-05-11 MED ORDER — BRIMONIDINE TARTRATE-TIMOLOL 0.2-0.5 % OP SOLN
OPHTHALMIC | Status: DC | PRN
  Administered 2022-05-11: 1 [drp] via OPHTHALMIC

## 2022-05-11 MED ORDER — LACTATED RINGERS IV SOLN: INTRAVENOUS | Status: DC

## 2022-05-11 MED ORDER — ACETAMINOPHEN 10 MG/ML IV SOLN: 1000.0000 mg | Freq: Once | INTRAVENOUS | Status: DC | PRN

## 2022-05-11 MED ORDER — OXYCODONE HCL 5 MG/5ML PO SOLN: 5.0000 mg | Freq: Once | ORAL | Status: DC | PRN

## 2022-05-11 MED ORDER — PROMETHAZINE HCL 25 MG/ML IJ SOLN: 6.2500 mg | INTRAMUSCULAR | Status: DC | PRN

## 2022-05-11 MED ORDER — SIGHTPATH DOSE#1 BSS IO SOLN
INTRAOCULAR | Status: DC | PRN
  Administered 2022-05-11: 58 mL via OPHTHALMIC

## 2022-05-11 MED ORDER — FENTANYL CITRATE (PF) 100 MCG/2ML IJ SOLN
INTRAMUSCULAR | Status: DC | PRN
  Administered 2022-05-11: 50 ug via INTRAVENOUS

## 2022-05-11 MED ORDER — SIGHTPATH DOSE#1 BSS IO SOLN
INTRAOCULAR | Status: DC | PRN
  Administered 2022-05-11: 2 mL

## 2022-05-11 MED ORDER — TETRACAINE HCL 0.5 % OP SOLN
1.0000 [drp] | OPHTHALMIC | Status: DC | PRN
  Administered 2022-05-11 (×3): 1 [drp] via OPHTHALMIC

## 2022-05-11 SURGICAL SUPPLY — 17 items
CANNULA ANT/CHMB 27G (MISCELLANEOUS) IMPLANT
CANNULA ANT/CHMB 27GA (MISCELLANEOUS) IMPLANT
CATARACT SUITE SIGHTPATH (MISCELLANEOUS) ×1 IMPLANT
FEE CATARACT SUITE SIGHTPATH (MISCELLANEOUS) ×1 IMPLANT
GLOVE BIOGEL PI IND STRL 8 (GLOVE) ×1 IMPLANT
GLOVE SURG ENC TEXT LTX SZ8 (GLOVE) ×1 IMPLANT
LENS CLAREON VIVITY TORIC 15.0 ×1 IMPLANT
LENS CLRN VIVITY TORIC 3 15.0 ×1 IMPLANT
LENS IOL CLRN VT TRC 3 15.0 IMPLANT
NDL FILTER BLUNT 18X1 1/2 (NEEDLE) ×1 IMPLANT
NEEDLE FILTER BLUNT 18X1 1/2 (NEEDLE) ×1 IMPLANT
PACK VIT ANT 23G (MISCELLANEOUS) IMPLANT
RING MALYGIN (MISCELLANEOUS) IMPLANT
SUT ETHILON 10-0 CS-B-6CS-B-6 (SUTURE)
SUTURE EHLN 10-0 CS-B-6CS-B-6 (SUTURE) IMPLANT
SYR 3ML LL SCALE MARK (SYRINGE) ×1 IMPLANT
WATER STERILE IRR 250ML POUR (IV SOLUTION) ×1 IMPLANT

## 2022-05-11 NOTE — Transfer of Care (Signed)
Immediate Anesthesia Transfer of Care Note  Patient: Jessica Sullivan  Procedure(s) Performed: CATARACT EXTRACTION PHACO AND INTRAOCULAR LENS PLACEMENT ([facility]) RIGHT CLAREON VIVITY TORIC LENS 9.98 01:03.7 (Right: Eye)  Patient Location: PACU  Anesthesia Type: No value filed.  Level of Consciousness: awake, alert  and patient cooperative  Airway and Oxygen Therapy: Patient Spontanous Breathing and Patient connected to supplemental oxygen  Post-op Assessment: Post-op Vital signs reviewed, Patient's Cardiovascular Status Stable, Respiratory Function Stable, Patent Airway and No signs of Nausea or vomiting  Post-op Vital Signs: Reviewed and stable  Complications: No notable events documented.

## 2022-05-11 NOTE — Anesthesia Preprocedure Evaluation (Signed)
Anesthesia Evaluation  Patient identified by MRN, date of birth, ID band Patient awake    Reviewed: Allergy & Precautions, H&P , NPO status , Patient's Chart, lab work & pertinent test results, reviewed documented beta blocker date and time   Airway Mallampati: II  TM Distance: >3 FB Neck ROM: full    Dental no notable dental hx. (+) Teeth Intact   Pulmonary neg pulmonary ROS   Pulmonary exam normal breath sounds clear to auscultation       Cardiovascular Exercise Tolerance: Good hypertension, On Medications negative cardio ROS  Rhythm:regular Rate:Normal     Neuro/Psych negative neurological ROS  negative psych ROS   GI/Hepatic negative GI ROS, Neg liver ROS,,,  Endo/Other  negative endocrine ROSdiabetes, Well Controlled    Renal/GU      Musculoskeletal   Abdominal   Peds  Hematology negative hematology ROS (+)   Anesthesia Other Findings   Reproductive/Obstetrics negative OB ROS                             Anesthesia Physical Anesthesia Plan  ASA: 2  Anesthesia Plan: MAC   Post-op Pain Management:    Induction:   PONV Risk Score and Plan:   Airway Management Planned:   Additional Equipment:   Intra-op Plan:   Post-operative Plan:   Informed Consent: I have reviewed the patients History and Physical, chart, labs and discussed the procedure including the risks, benefits and alternatives for the proposed anesthesia with the patient or authorized representative who has indicated his/her understanding and acceptance.       Plan Discussed with: CRNA  Anesthesia Plan Comments:        Anesthesia Quick Evaluation  

## 2022-05-11 NOTE — Op Note (Signed)
PREOPERATIVE DIAGNOSIS:  Nuclear sclerotic cataract of the right eye.   POSTOPERATIVE DIAGNOSIS:  Nuclear sclerotic cataract of the right eye.   OPERATIVE PROCEDURE: Procedure(s): CATARACT EXTRACTION PHACO AND INTRAOCULAR LENS PLACEMENT (IOC) RIGHT CLAREON VIVITY TORIC LENS 9.98 01:03.7   SURGEON:  Birder Robson, MD.   ANESTHESIA: 1.      Managed anesthesia care. 2.     0.54m of Shugarcaine was instilled following the paracentesis  Anesthesiologist: AMolli Barrows MD CRNA: JMoises Blood CRNA  COMPLICATIONS:  None.   TECHNIQUE:   Stop and chop    DESCRIPTION OF PROCEDURE:  The patient was examined and consented in the preoperative holding area where the aforementioned topical anesthesia was applied to the right eye.  The patient was brought back to the Operating Room where he was sat upright on the gurney and given a target to fixate upon while the eye was marked at the 3:00 and 9:00 position.  The patient was then reclined on the operating table.  The eye was prepped and draped in the usual sterile ophthalmic fashion and a lid speculum was placed. A paracentesis was created with the side port blade and the anterior chamber was filled with viscoelastic. A near clear corneal incision was performed with the steel keratome. A continuous curvilinear capsulorrhexis was performed with a cystotome followed by the capsulorrhexis forceps. Hydrodissection and hydrodelineation were carried out with BSS on a blunt cannula. The lens was removed in a stop and chop technique and the remaining cortical material was removed with the irrigation-aspiration handpiece. The eye was inflated with viscoelastic and the ZCT  lens  was placed in the eye and rotated to within a few degrees of the predetermined orientation.  The remaining viscoelastic was removed from the eye.  The Sinskey hook was used to rotate the toric lens into its final resting place at 026 degrees.  0. The eye was inflated to a physiologic pressure  and found to be watertight. 0.186mof Vigamox was placed in the anterior chamber.  The eye was dressed with Vigamox.and Combigan. The patient was given protective glasses to wear throughout the day and a shield with which to sleep tonight. The patient was also given drops with which to begin a drop regimen today and will follow-up with me in one day. Implant Name Type Inv. Item Serial No. Manufacturer Lot No. LRB No. Used Action  LENS CLAREON VIVITY TORIC 15.0 - [id number] CLAREON VIVITY TORIC 15.0 [id number]  Right 1 Implanted   Procedure(s): CATARACT EXTRACTION PHACO AND INTRAOCULAR LENS PLACEMENT (IOC) RIGHT CLAREON VIVITY TORIC LENS 9.98 01:03.7 (Right)  Electronically signed: WiBirder Robson/07/2022 9:14 AM

## 2022-05-11 NOTE — H&P (Signed)
[facility]   Primary Care Physician:  Venia Carbon, MD Ophthalmologist: Dr. George Ina  Pre-Procedure History & Physical: HPI:  Jessica Sullivan is a 77 y.o. female here for cataract surgery.   Past Medical History:  Diagnosis Date   Chronic venous insufficiency    History of dysplastic nevus 06/07/2013   left post med thigh/moderate   Hyperlipidemia    Lichen sclerosus 123XX123   Vulvar biopsy-lichen sclerosus    Osteoarthritis of both knees 03/16/2017   Wears contact lenses     Past Surgical History:  Procedure Laterality Date   CATARACT EXTRACTION W/PHACO Left 04/27/2022   Procedure: CATARACT EXTRACTION PHACO AND INTRAOCULAR LENS PLACEMENT ([place]) LEFT CLAREON VIVITY toric lens 11.65 01:17.6;  Surgeon: Birder Robson, MD;  Location: [place];  Service: Ophthalmology;  Laterality: Left;   CESAREAN SECTION  1978   Chin lift  8/14   DILATION AND CURETTAGE OF UTERUS  1990   year ago- polyp   HAMMER TOE SURGERY  12/13   JOINT REPLACEMENT     KNEE ARTHROSCOPY Right 3/13   meniscus   MPJ Release 2nd toe left     TOTAL KNEE ARTHROPLASTY Right 10/11/2017   Procedure: TOTAL KNEE ARTHROPLASTY;  Surgeon: Hessie Knows, MD;  Location: [facility];  Service: Orthopedics;  Laterality: Right;   TOTAL KNEE ARTHROPLASTY Left 04/01/2020   Procedure: TOTAL KNEE ARTHROPLASTY;  Surgeon: Hessie Knows, MD;  Location: [facility];  Service: Orthopedics;  Laterality: Left;   VARICOSE VEIN SURGERY  2004, 2011    Prior to Admission medications   Medication Sig Start Date End Date Taking? Authorizing Provider  Calcium Carbonate-Vitamin D (CALCIUM + D PO) Take 1 tablet by mouth daily.   Yes [provider]  clobetasol ointment (TEMOVATE) 0.05 % APPLY TOPICALLY TWICE A WEEK AS NEEDED 05/28/21  Yes Venia Carbon, MD  Multiple Vitamins-Minerals (CENTRUM SILVER ADULT 50+) TABS Take 1 tablet by mouth daily.   Yes [provider]  Multiple Vitamins-Minerals (ICAPS  AREDS 2) CAPS Take 1 capsule by mouth 2 (two) times daily.   Yes [provider]  Omega-3 Fatty Acids (FISH OIL) 1000 MG CAPS Take 1 capsule by mouth daily.    Yes [provider]  OVER THE COUNTER MEDICATION Juicefestiv - (1)fruit & (1)veggie   Yes [provider]    Allergies as of 03/31/2022 - Review Complete 11/05/2021  Allergen Reaction Noted   Flavoring agent  09/28/2017   Oxycodone Other (See Comments) 09/28/2017   Poison ivy extract Itching and Rash     Family History  Problem Relation Age of Onset   Dementia Mother    Cancer Father    Cancer Brother        prostate/prostate   Dementia Brother        corticobasilar degeneration   Parkinson's disease Brother    Cancer Brother        prostate   Breast cancer Maternal Aunt    Breast cancer Cousin    Heart disease Neg Hx    Diabetes Neg Hx    Ovarian cancer Neg Hx    Colon cancer Neg Hx     Social History   Socioeconomic History   Marital status: Married    Spouse name: Not on file   Number of children: 1   Years of education: Not on file   Highest education level: Not on file  Occupational History   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:  briefly, 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  Tobacco Use   Smoking status: Never   Smokeless tobacco: Never  Vaping Use   Vaping Use: Never used  Substance and Sexual Activity   Alcohol use: Yes    Comment: once a week   Drug use: No   Sexual activity: Yes    Birth control/protection: Post-menopausal  Other Topics Concern   Not on file  Social History Narrative   Has living will   Husband is health care POA--- son is alternate   Would accept resuscitation attempts but no prolonged artificial ventilation   No tube feeds if cognitively unaware   Social Determinants of Health   Financial Resource Strain: Not on file  Food Insecurity: Not on file  Transportation Needs: Not on file  Physical Activity: Not on file  Stress: Not on file   Social Connections: Not on file  Intimate Partner Violence: Not on file    Review of Systems: See HPI, otherwise negative ROS  Physical Exam: BP (!) 157/81   Pulse 74   Temp (!) 97.2 F (36.2 C) (Temporal)   Resp 17   Ht '5\' 1"'$  (1.549 m)   Wt 64.4 kg   SpO2 96%   BMI 26.83 kg/m  General:   Alert, cooperative in NAD Head:  Normocephalic and atraumatic. Respiratory:  Normal work of breathing. Cardiovascular:  RRR  Impression/Plan: Jessica Sullivan is here for cataract surgery.  Risks, benefits, limitations, and alternatives regarding cataract surgery have been reviewed with the patient.  Questions have been answered.  All parties agreeable.   Birder Robson, MD  05/11/2022, 8:48 AM

## 2022-05-11 NOTE — Anesthesia Postprocedure Evaluation (Signed)
Anesthesia Post Note  Patient: Jessica Sullivan  Procedure(s) Performed: CATARACT EXTRACTION PHACO AND INTRAOCULAR LENS PLACEMENT (I[facility]) RIGHT CLAREON VIVITY TORIC LENS 9.98 01:03.7 (Right: Eye)  Patient location during evaluation: PACU Anesthesia Type: MAC Level of consciousness: awake and alert Pain management: pain level controlled Vital Signs Assessment: post-procedure vital signs reviewed and stable Respiratory status: spontaneous breathing, nonlabored ventilation, respiratory function stable and patient connected to nasal cannula oxygen Cardiovascular status: stable and blood pressure returned to baseline Postop Assessment: no apparent nausea or vomiting Anesthetic complications: no   No notable events documented.   Last Vitals:  Vitals:   05/11/22 0915 05/11/22 0920  BP: (!) 140/68 127/82  Pulse: 60 66  Resp: 13 20  Temp: (!) 36.1 C (!) 36.4 C  SpO2: 97% 96%    Last Pain:  Vitals:   05/11/22 0920  TempSrc:   PainSc: 0-No pain                 Molli Barrows

## 2022-05-12 ENCOUNTER — Encounter: Payer: Self-pay | Admitting: Ophthalmology

## 2022-05-21 ENCOUNTER — Other Ambulatory Visit: Payer: Self-pay | Admitting: Internal Medicine

## 2022-05-21 DIAGNOSIS — Z1231 Encounter for screening mammogram for malignant neoplasm of breast: Secondary | ICD-10-CM

## 2022-06-01 ENCOUNTER — Encounter: Payer: Self-pay | Admitting: Internal Medicine

## 2022-06-01 ENCOUNTER — Ambulatory Visit (INDEPENDENT_AMBULATORY_CARE_PROVIDER_SITE_OTHER): Payer: Medicare HMO | Admitting: Internal Medicine

## 2022-06-01 VITALS — BP 136/84 | HR 72 | Temp 97.5°F | Ht 61.5 in | Wt 141.0 lb

## 2022-06-01 DIAGNOSIS — Z0000 Encounter for general adult medical examination without abnormal findings: Secondary | ICD-10-CM

## 2022-06-01 DIAGNOSIS — Z1211 Encounter for screening for malignant neoplasm of colon: Secondary | ICD-10-CM | POA: Diagnosis not present

## 2022-06-01 DIAGNOSIS — I8390 Asymptomatic varicose veins of unspecified lower extremity: Secondary | ICD-10-CM

## 2022-06-01 DIAGNOSIS — L900 Lichen sclerosus et atrophicus: Secondary | ICD-10-CM | POA: Diagnosis not present

## 2022-06-01 DIAGNOSIS — M159 Polyosteoarthritis, unspecified: Secondary | ICD-10-CM | POA: Diagnosis not present

## 2022-06-01 LAB — COMPREHENSIVE METABOLIC PANEL
ALT: 20 U/L (ref 0–35)
AST: 17 U/L (ref 0–37)
Albumin: 4.4 g/dL (ref 3.5–5.2)
Alkaline Phosphatase: 107 U/L (ref 39–117)
BUN: 20 mg/dL (ref 6–23)
CO2: 28 mEq/L (ref 19–32)
Calcium: 10 mg/dL (ref 8.4–10.5)
Chloride: 103 mEq/L (ref 96–112)
Creatinine, Ser: 0.76 mg/dL (ref 0.40–1.20)
GFR: 76.19 mL/min (ref >60.00)
Glucose, Bld: 98 mg/dL (ref 70–99)
Potassium: 3.9 mEq/L (ref 3.5–5.1)
Sodium: 139 mEq/L (ref 135–145)
Total Bilirubin: 0.5 mg/dL (ref 0.2–1.2)
Total Protein: 7.6 g/dL (ref 6.0–8.3)

## 2022-06-01 LAB — CBC
HCT: 43.1 % (ref 36.0–46.0)
Hemoglobin: 14.6 g/dL (ref 12.0–15.0)
MCHC: 34 g/dL (ref 30.0–36.0)
MCV: 87.8 fl (ref 78.0–100.0)
Platelets: 275 10*3/uL (ref 150.0–400.0)
RBC: 4.91 Mil/uL (ref 3.87–5.11)
RDW: 13.9 % (ref 11.5–15.5)
WBC: 5.9 10*3/uL (ref 4.0–10.5)

## 2022-06-01 NOTE — Assessment & Plan Note (Signed)
Some itching but no sig pain Wears support hose for travel Not ready for more intervention

## 2022-06-01 NOTE — Progress Notes (Signed)
Subjective:    Patient ID: Jessica Sullivan, female    DOB: 24-Oct-1945, 77 y.o.   MRN: [id number]  HPI Here for Medicare wellness visit and follow up of chronic health conditions Reviewed advanced directives Reviewed other doctors---Dr Porfilio--ophthal, Dr Maryellen Pile, Dr Sammuel Bailiff Had cataract extractions on both eyes--went well No other hospitalizations or surgery No tobacco Occasional alcohol--when out to eat Walking regularly---still not doing resistance Slipped once while walking the dog--no injury No depression or anhedonia Independent with instrumental ADLs No sig memory issues  Has had recurrence of varicose veins despite the sclerotherapy Not going for more Rx  Ongoing pain in hips Not ready for THR Did have injection once Only occasional aleve for this Still affects her sleep some-----tends to fall asleep on the couch, then up 1-2 hours later and goes to bed Has tried OTC meds---not using anything now  Uses the clobetasol weekly prn for the lichen sclerosis  Current Outpatient Medications on File Prior to Visit  Medication Sig Dispense Refill   Calcium Carbonate-Vitamin D (CALCIUM + D PO) Take 1 tablet by mouth daily.     clobetasol ointment (TEMOVATE) 0.05 % APPLY TOPICALLY TWICE A WEEK AS NEEDED (Patient taking differently: Apply 1 Application topically. APPLY TOPICALLY ONCE A WEEK AS NEEDED) 45 g 2   Multiple Vitamins-Minerals (CENTRUM SILVER ADULT 50+) TABS Take 1 tablet by mouth daily.     Multiple Vitamins-Minerals (ICAPS AREDS 2) CAPS Take 1 capsule by mouth 2 (two) times daily.     Omega-3 Fatty Acids (FISH OIL) 1000 MG CAPS Take 1 capsule by mouth daily.      OVER THE COUNTER MEDICATION Juicefestiv - (1)fruit & (1)veggie     No current facility-administered medications on file prior to visit.    Allergies  Allergen Reactions   Flavoring Agent     Only peach snapps   Oxycodone Other (See Comments)    Light headed.   Poison Ivy Extract Itching  and Rash    Past Medical History:  Diagnosis Date   Chronic venous insufficiency    History of dysplastic nevus 06/07/2013   left post med thigh/moderate   Hyperlipidemia    Lichen sclerosus [id number]   Vulvar biopsy-lichen sclerosus    Osteoarthritis of both knees 03/16/2017   Wears contact lenses     Past Surgical History:  Procedure Laterality Date   CATARACT EXTRACTION W/PHACO Left 04/27/2022   Procedure: CATARACT EXTRACTION PHACO AND INTRAOCULAR LENS PLACEMENT ([place]) LEFT CLAREON VIVITY toric lens 11.65 01:17.6;  Surgeon: Birder Robson, MD;  Location: [place];  Service: Ophthalmology;  Laterality: Left;   CATARACT EXTRACTION W/PHACO Right 05/11/2022   Procedure: CATARACT EXTRACTION PHACO AND INTRAOCULAR LENS PLACEMENT ([place]) RIGHT CLAREON VIVITY TORIC LENS 9.98 01:03.7;  Surgeon: Birder Robson, MD;  Location: [place];  Service: Ophthalmology;  Laterality: Right;   CESAREAN SECTION  1978   Chin lift  8/14   DILATION AND CURETTAGE OF UTERUS  1990   year ago- polyp   HAMMER TOE SURGERY  12/13   JOINT REPLACEMENT     KNEE ARTHROSCOPY Right 3/13   meniscus   MPJ Release 2nd toe left     TOTAL KNEE ARTHROPLASTY Right 10/11/2017   Procedure: TOTAL KNEE ARTHROPLASTY;  Surgeon: Hessie Knows, MD;  Location: [facility];  Service: Orthopedics;  Laterality: Right;   TOTAL KNEE ARTHROPLASTY Left 04/01/2020   Procedure: TOTAL KNEE ARTHROPLASTY;  Surgeon: Hessie Knows, MD;  Location: [facility];  Service: Orthopedics;  Laterality: Left;  VARICOSE VEIN SURGERY  2004, 2011    Family History  Problem Relation Age of Onset   Dementia Mother    Cancer Father    Cancer Brother        prostate/prostate   Dementia Brother        corticobasilar degeneration   Parkinson's disease Brother    Cancer Brother        prostate   Breast cancer Maternal Aunt    Breast cancer Cousin    Heart disease Neg Hx    Diabetes Neg Hx    Ovarian cancer Neg Hx    Colon cancer Neg  Hx     Social History   Socioeconomic History   Marital status: Married    Spouse name: Not on file   Number of children: 1   Years of education: Not on file   Highest education level: Not on file  Occupational History   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: briefly, 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  Tobacco Use   Smoking status: Never    Passive exposure: Past   Smokeless tobacco: Never  Vaping Use   Vaping Use: Never used  Substance and Sexual Activity   Alcohol use: Yes    Comment: once a week   Drug use: No   Sexual activity: Yes    Birth control/protection: Post-menopausal  Other Topics Concern   Not on file  Social History Narrative   Has living will   Husband is health care POA--- son is alternate   Would accept resuscitation attempts but no prolonged artificial ventilation   No tube feeds if cognitively unaware   Social Determinants of Health   Financial Resource Strain: Not on file  Food Insecurity: Not on file  Transportation Needs: Not on file  Physical Activity: Not on file  Stress: Not on file  Social Connections: Not on file  Intimate Partner Violence: Not on file   Review of Systems Appetite is good Weight stable Wears seat belt Teeth are good--keeps up with dentist Rare heartburn---tums relieves. No dysphagia Bowels move fine---no blood No urinary problems No suspicious skin lesions No chest pain or SOB No dizziness or syncope No edema    Objective:   Physical Exam Constitutional:      Appearance: Normal appearance.  HENT:     Mouth/Throat:     Pharynx: No oropharyngeal exudate or posterior oropharyngeal erythema.  Eyes:     Conjunctiva/sclera: Conjunctivae normal.     Pupils: Pupils are equal, round, and reactive to light.  Cardiovascular:     Rate and Rhythm: Normal rate and regular rhythm.     Pulses: Normal pulses.     Heart sounds: No murmur heard.    No gallop.  Pulmonary:     Effort: Pulmonary effort is normal.      Breath sounds: Normal breath sounds. No wheezing or rales.  Abdominal:     Palpations: Abdomen is soft.     Tenderness: There is no abdominal tenderness.  Musculoskeletal:     Cervical back: Neck supple.     Right lower leg: No edema.     Left lower leg: No edema.  Lymphadenopathy:     Cervical: No cervical adenopathy.  Skin:    Findings: No rash.  Neurological:     General: No focal deficit present.     Mental Status: She is alert and oriented to person, place, and time.     Comments: Word naming 12/1 minute  Recall 3/3  Psychiatric:        Mood and Affect: Mood normal.        Behavior: Behavior normal.            Assessment & Plan:

## 2022-06-01 NOTE — Addendum Note (Signed)
Addended by: Viviana Simpler I on: 06/01/2022 09:30 AM   Modules accepted: Orders

## 2022-06-01 NOTE — Progress Notes (Signed)
Hearing Screening - Comments:: Passed whisper test Vision Screening - Comments:: March 2024

## 2022-06-01 NOTE — Assessment & Plan Note (Signed)
I have personally reviewed the Medicare Annual Wellness questionnaire and have noted 1. The patient's medical and social history 2. Their use of alcohol, tobacco or illicit drugs 3. Their current medications and supplements 4. The patient's functional ability including ADL's, fall risks, home safety risks and hearing or visual             impairment. 5. Diet and physical activities 6. Evidence for depression or mood disorders  The patients weight, height, BMI and visual acuity have been recorded in the chart I have made referrals, counseling and provided education to the patient based review of the above and I have provided the pt with a written personalized care plan for preventive services.  I have provided you with a copy of your personalized plan for preventive services. Please take the time to review along with your updated medication list.  Will do FIT again Has mammogram scheduled soon Discussed adding resistance training to her walking Flu vaccine in the fall Consider COVID update--she isn't sure

## 2022-06-01 NOTE — Assessment & Plan Note (Signed)
Uses aleve prn Not ready for THR yet

## 2022-06-01 NOTE — Assessment & Plan Note (Signed)
Uses the clobetasol weekly for the most part

## 2022-06-02 ENCOUNTER — Encounter: Payer: Self-pay | Admitting: Internal Medicine

## 2022-06-02 ENCOUNTER — Other Ambulatory Visit: Payer: Self-pay | Admitting: Radiology

## 2022-06-02 DIAGNOSIS — Z1211 Encounter for screening for malignant neoplasm of colon: Secondary | ICD-10-CM

## 2022-06-02 LAB — FECAL OCCULT BLOOD, IMMUNOCHEMICAL: Fecal Occult Bld: NEGATIVE

## 2022-07-19 ENCOUNTER — Ambulatory Visit
Admission: RE | Admit: 2022-07-19 | Discharge: 2022-07-19 | Disposition: A | Discharge status: Home / Self Care | Payer: Medicare HMO | Source: Ambulatory Visit | Attending: Internal Medicine | Admitting: Internal Medicine

## 2022-07-19 DIAGNOSIS — Z1231 Encounter for screening mammogram for malignant neoplasm of breast: Secondary | ICD-10-CM | POA: Insufficient documentation

## 2022-11-04 DIAGNOSIS — M79604 Pain in right leg: Secondary | ICD-10-CM | POA: Diagnosis not present

## 2022-11-04 DIAGNOSIS — M79662 Pain in left lower leg: Secondary | ICD-10-CM | POA: Diagnosis not present

## 2022-11-04 DIAGNOSIS — R229 Localized swelling, mass and lump, unspecified: Secondary | ICD-10-CM | POA: Diagnosis not present

## 2022-11-04 DIAGNOSIS — I83893 Varicose veins of bilateral lower extremities with other complications: Secondary | ICD-10-CM | POA: Diagnosis not present

## 2022-11-04 DIAGNOSIS — M79661 Pain in right lower leg: Secondary | ICD-10-CM | POA: Diagnosis not present

## 2022-11-30 DIAGNOSIS — I872 Venous insufficiency (chronic) (peripheral): Secondary | ICD-10-CM | POA: Diagnosis not present

## 2022-12-01 ENCOUNTER — Encounter: Payer: Medicare HMO | Admitting: Dermatology

## 2022-12-02 DIAGNOSIS — Z09 Encounter for follow-up examination after completed treatment for conditions other than malignant neoplasm: Secondary | ICD-10-CM | POA: Diagnosis not present

## 2022-12-02 DIAGNOSIS — I83891 Varicose veins of right lower extremities with other complications: Secondary | ICD-10-CM | POA: Diagnosis not present

## 2022-12-02 DIAGNOSIS — I872 Venous insufficiency (chronic) (peripheral): Secondary | ICD-10-CM | POA: Diagnosis not present

## 2022-12-14 DIAGNOSIS — I83891 Varicose veins of right lower extremities with other complications: Secondary | ICD-10-CM | POA: Diagnosis not present

## 2022-12-16 DIAGNOSIS — I83892 Varicose veins of left lower extremities with other complications: Secondary | ICD-10-CM | POA: Diagnosis not present

## 2022-12-28 DIAGNOSIS — I83811 Varicose veins of right lower extremities with pain: Secondary | ICD-10-CM | POA: Diagnosis not present

## 2022-12-30 DIAGNOSIS — I83812 Varicose veins of left lower extremities with pain: Secondary | ICD-10-CM | POA: Diagnosis not present

## 2023-01-05 ENCOUNTER — Encounter: Payer: Self-pay | Admitting: Dermatology

## 2023-01-05 ENCOUNTER — Ambulatory Visit: Payer: Medicare HMO | Admitting: Dermatology

## 2023-01-05 DIAGNOSIS — W908XXA Exposure to other nonionizing radiation, initial encounter: Secondary | ICD-10-CM

## 2023-01-05 DIAGNOSIS — D1801 Hemangioma of skin and subcutaneous tissue: Secondary | ICD-10-CM | POA: Diagnosis not present

## 2023-01-05 DIAGNOSIS — L814 Other melanin hyperpigmentation: Secondary | ICD-10-CM | POA: Diagnosis not present

## 2023-01-05 DIAGNOSIS — L578 Other skin changes due to chronic exposure to nonionizing radiation: Secondary | ICD-10-CM

## 2023-01-05 DIAGNOSIS — Z1283 Encounter for screening for malignant neoplasm of skin: Secondary | ICD-10-CM | POA: Diagnosis not present

## 2023-01-05 DIAGNOSIS — Z86018 Personal history of other benign neoplasm: Secondary | ICD-10-CM

## 2023-01-05 DIAGNOSIS — L820 Inflamed seborrheic keratosis: Secondary | ICD-10-CM

## 2023-01-05 DIAGNOSIS — D229 Melanocytic nevi, unspecified: Secondary | ICD-10-CM

## 2023-01-05 DIAGNOSIS — L821 Other seborrheic keratosis: Secondary | ICD-10-CM | POA: Diagnosis not present

## 2023-01-05 NOTE — Patient Instructions (Signed)

## 2023-01-05 NOTE — Progress Notes (Signed)
Follow-Up Visit   Subjective  Jessica Sullivan is a 77 y.o. female who presents for the following: Skin Cancer Screening and Full Body Skin Exam. Hx of dysplastic nevi.  Several areas below breasts and at bra-line. White spot at left upper chest.   Having treatments on veins in legs.   The patient presents for Total-Body Skin Exam (TBSE) for skin cancer screening and mole check. The patient has spots, moles and lesions to be evaluated, some may be new or changing and the patient may have concern these could be cancer.  This patient is accompanied in the office by her  husband .   The following portions of the chart were reviewed this encounter and updated as appropriate: medications, allergies, medical history  Review of Systems:  No other skin or systemic complaints except as noted in HPI or Assessment and Plan.  Objective  Well appearing patient in no apparent distress; mood and affect are within normal limits.  A full examination was performed including scalp, head, eyes, ears, nose, lips, neck, chest, axillae, abdomen, back, buttocks, bilateral upper extremities, bilateral lower extremities, hands, feet, fingers, toes, fingernails, and toenails. All findings within normal limits unless otherwise noted below.   Relevant physical exam findings are noted in the Assessment and Plan.  left scapula (2nd Tx) x1, inframammary x4 (5) Erythematous keratotic or waxy stuck-on papule or plaque.    Assessment & Plan   HISTORY OF DYSPLASTIC NEVUS. Left posterior medial thigh, moderate atypia, 06/07/2013. No evidence of recurrence today Recommend regular full body skin exams Recommend daily broad spectrum sunscreen SPF 30+ to sun-exposed areas, reapply every 2 hours as needed.  Call if any new or changing lesions are noted between office visits   SKIN CANCER SCREENING PERFORMED TODAY.  ACTINIC DAMAGE - Chronic condition, secondary to cumulative UV/sun exposure - diffuse scaly  erythematous macules with underlying dyspigmentation - Recommend daily broad spectrum sunscreen SPF 30+ to sun-exposed areas, reapply every 2 hours as needed.  - Staying in the shade or wearing long sleeves, sun glasses (UVA+UVB protection) and wide brim hats (4-inch brim around the entire circumference of the hat) are also recommended for sun protection.  - Call for new or changing lesions.  LENTIGINES, SEBORRHEIC KERATOSES, HEMANGIOMAS - Benign normal skin lesions - Benign-appearing - Call for any changes  MELANOCYTIC NEVI - Tan-brown and/or pink-flesh-colored symmetric macules and papules - Benign appearing on exam today - Observation - Call clinic for new or changing moles - Recommend daily use of broad spectrum spf 30+ sunscreen to sun-exposed areas.       Inflamed seborrheic keratosis (5) left scapula (2nd Tx) x1, inframammary x4  Symptomatic, irritating, patient would like treated.  Destruction of lesion - left scapula (2nd Tx) x1, inframammary x4 (5) Complexity: simple   Destruction method: cryotherapy   Informed consent: discussed and consent obtained   Timeout:  patient name, date of birth, surgical site, and procedure verified Lesion destroyed using liquid nitrogen: Yes   Region frozen until ice ball extended beyond lesion: Yes   Outcome: patient tolerated procedure well with no complications   Post-procedure details: wound care instructions given   Additional details:  Prior to procedure, discussed risks of blister formation, small wound, skin dyspigmentation, or rare scar following cryotherapy. Recommend Vaseline ointment to treated areas while healing.    Return in about 1 year (around 01/05/2024) for TBSE, HxDN.  ILawson Radar, CMA, am acting as scribe for Armida Sans, MD.   Documentation: I  have reviewed the above documentation for accuracy and completeness, and I agree with the above.  Armida Sans, MD

## 2023-01-18 DIAGNOSIS — I83811 Varicose veins of right lower extremities with pain: Secondary | ICD-10-CM | POA: Diagnosis not present

## 2023-01-20 DIAGNOSIS — I83812 Varicose veins of left lower extremities with pain: Secondary | ICD-10-CM | POA: Diagnosis not present

## 2023-03-24 DIAGNOSIS — L299 Pruritus, unspecified: Secondary | ICD-10-CM | POA: Diagnosis not present

## 2023-03-24 DIAGNOSIS — I83893 Varicose veins of bilateral lower extremities with other complications: Secondary | ICD-10-CM | POA: Diagnosis not present

## 2023-03-24 DIAGNOSIS — R252 Cramp and spasm: Secondary | ICD-10-CM | POA: Diagnosis not present

## 2023-03-24 DIAGNOSIS — G2581 Restless legs syndrome: Secondary | ICD-10-CM | POA: Diagnosis not present

## 2023-04-06 DIAGNOSIS — I83891 Varicose veins of right lower extremities with other complications: Secondary | ICD-10-CM | POA: Diagnosis not present

## 2023-04-06 DIAGNOSIS — I83812 Varicose veins of left lower extremities with pain: Secondary | ICD-10-CM | POA: Diagnosis not present

## 2023-04-06 DIAGNOSIS — Z09 Encounter for follow-up examination after completed treatment for conditions other than malignant neoplasm: Secondary | ICD-10-CM | POA: Diagnosis not present

## 2023-04-19 DIAGNOSIS — I83891 Varicose veins of right lower extremities with other complications: Secondary | ICD-10-CM | POA: Diagnosis not present

## 2023-04-26 DIAGNOSIS — I83892 Varicose veins of left lower extremities with other complications: Secondary | ICD-10-CM | POA: Diagnosis not present

## 2023-05-03 DIAGNOSIS — I83811 Varicose veins of right lower extremities with pain: Secondary | ICD-10-CM | POA: Diagnosis not present

## 2023-05-16 ENCOUNTER — Encounter: Payer: Self-pay | Admitting: Family

## 2023-05-16 ENCOUNTER — Ambulatory Visit (INDEPENDENT_AMBULATORY_CARE_PROVIDER_SITE_OTHER): Admitting: Family

## 2023-05-16 VITALS — BP 130/72 | HR 94 | Temp 98.6°F | Ht 61.5 in | Wt 144.2 lb

## 2023-05-16 DIAGNOSIS — L03032 Cellulitis of left toe: Secondary | ICD-10-CM | POA: Diagnosis not present

## 2023-05-16 MED ORDER — CEPHALEXIN 500 MG PO CAPS: 500.0000 mg | ORAL_CAPSULE | Freq: Four times a day (QID) | ORAL | 0 refills | Status: AC

## 2023-05-16 NOTE — Assessment & Plan Note (Addendum)
 Rx keflex 500 mg qid x 7 days  Discussed warm soaks throughout the day Please monitor site for worsening signs/symptoms of infection to include: increasing redness, increasing tenderness, increase in size, and or pustulant drainage from site. If this is to occur please let me know immediately.

## 2023-05-16 NOTE — Progress Notes (Signed)
   Established Patient Office Visit  Subjective:   Patient ID: Jessica Sullivan, female    DOB: 10/27/1945  Age: 78 y.o. MRN: [id number]  CC:  Chief Complaint  Patient presents with   Acute Visit    Infected big toe on R foot    HPI: Jessica Sullivan is a 78 y.o. female presenting on 05/16/2023 for Acute Visit (Infected big toe on R foot)  Went for a pedicure 3/25 the nail lady cut her great toe nails down and since has noticed  her left great toe is with redness and tenderness.         ROS: Negative unless specifically indicated above in HPI.   Relevant past medical history reviewed and updated as indicated.   Allergies and medications reviewed and updated.   Current Outpatient Medications:    Calcium Carbonate-Vitamin D (CALCIUM + D PO), Take 1 tablet by mouth daily., Disp: , Rfl:    cephALEXin (KEFLEX) 500 MG capsule, Take 1 capsule (500 mg total) by mouth 4 (four) times daily for 7 days., Disp: 28 capsule, Rfl: 0   clobetasol ointment (TEMOVATE) 0.05 %, APPLY TOPICALLY TWICE A WEEK AS NEEDED (Patient taking differently: Apply 1 Application topically. APPLY TOPICALLY ONCE A WEEK AS NEEDED), Disp: 45 g, Rfl: 2   Multiple Vitamins-Minerals (CENTRUM SILVER ADULT 50+) TABS, Take 1 tablet by mouth daily., Disp: , Rfl:    Multiple Vitamins-Minerals (ICAPS AREDS 2) CAPS, Take 1 capsule by mouth 2 (two) times daily., Disp: , Rfl:    Omega-3 Fatty Acids (FISH OIL) 1000 MG CAPS, Take 1 capsule by mouth daily. , Disp: , Rfl:   Allergies  Allergen Reactions   Flavoring Agent (Non-Screening)     Only peach snapps   Oxycodone Other (See Comments)    Light headed.   Poison Ivy Extract Itching and Rash    Objective:   BP 130/72 (BP Location: Right Arm, Patient Position: Sitting, Cuff Size: Normal)   Pulse 94   Temp 98.6 F (37 C) (Temporal)   Ht 5' 1.5" (1.562 m)   Wt 144 lb 3.2 oz (65.4 kg)   SpO2 97%   BMI 26.81 kg/m    Physical Exam Constitutional:      Appearance:  Normal appearance.  Cardiovascular:     Rate and Rhythm: Normal rate.  Feet:     Left foot:     Skin integrity: Erythema and warmth present.     Comments: Tenderness surrounding left great toe nailbed No discharge present Neurological:     Mental Status: She is alert.     Assessment & Plan:  Paronychia of great toe of left foot Assessment & Plan: Rx keflex 500 mg qid x 7 days  Discussed warm soaks throughout the day Please monitor site for worsening signs/symptoms of infection to include: increasing redness, increasing tenderness, increase in size, and or pustulant drainage from site. If this is to occur please let me know immediately.     Orders: -     Cephalexin; Take 1 capsule (500 mg total) by mouth 4 (four) times daily for 7 days.  Dispense: 28 capsule; Refill: 0     Follow up plan: Return if symptoms worsen or fail to improve.  Mort Sawyers, FNP

## 2023-06-02 ENCOUNTER — Encounter: Payer: Medicare HMO | Admitting: Internal Medicine

## 2023-06-15 ENCOUNTER — Encounter: Payer: Self-pay | Admitting: Internal Medicine

## 2023-06-15 ENCOUNTER — Ambulatory Visit (INDEPENDENT_AMBULATORY_CARE_PROVIDER_SITE_OTHER): Payer: Medicare HMO | Admitting: Internal Medicine

## 2023-06-15 VITALS — BP 138/88 | HR 75 | Temp 98.7°F | Ht 61.5 in | Wt 144.0 lb

## 2023-06-15 DIAGNOSIS — Z1211 Encounter for screening for malignant neoplasm of colon: Secondary | ICD-10-CM | POA: Diagnosis not present

## 2023-06-15 DIAGNOSIS — I8390 Asymptomatic varicose veins of unspecified lower extremity: Secondary | ICD-10-CM

## 2023-06-15 DIAGNOSIS — K219 Gastro-esophageal reflux disease without esophagitis: Secondary | ICD-10-CM | POA: Diagnosis not present

## 2023-06-15 DIAGNOSIS — Z0000 Encounter for general adult medical examination without abnormal findings: Secondary | ICD-10-CM | POA: Diagnosis not present

## 2023-06-15 DIAGNOSIS — L900 Lichen sclerosus et atrophicus: Secondary | ICD-10-CM | POA: Diagnosis not present

## 2023-06-15 DIAGNOSIS — Z1159 Encounter for screening for other viral diseases: Secondary | ICD-10-CM | POA: Diagnosis not present

## 2023-06-15 LAB — COMPREHENSIVE METABOLIC PANEL WITH GFR
ALT: 17 U/L (ref 0–35)
AST: 16 U/L (ref 0–37)
Albumin: 4.4 g/dL (ref 3.5–5.2)
Alkaline Phosphatase: 108 U/L (ref 39–117)
BUN: 24 mg/dL — ABNORMAL HIGH (ref 6–23)
CO2: 27 meq/L (ref 19–32)
Calcium: 9.5 mg/dL (ref 8.4–10.5)
Chloride: 104 meq/L (ref 96–112)
Creatinine, Ser: 0.65 mg/dL (ref 0.40–1.20)
GFR: 84.98 mL/min (ref >60.00)
Glucose, Bld: 96 mg/dL (ref 70–99)
Potassium: 3.9 meq/L (ref 3.5–5.1)
Sodium: 141 meq/L (ref 135–145)
Total Bilirubin: 0.4 mg/dL (ref 0.2–1.2)
Total Protein: 7.1 g/dL (ref 6.0–8.3)

## 2023-06-15 LAB — LIPID PANEL
Cholesterol: 186 mg/dL (ref 0–200)
HDL: 58.7 mg/dL (ref >39.00)
LDL Cholesterol: 104 mg/dL — ABNORMAL HIGH (ref 0–99)
NonHDL: 126.91
Total CHOL/HDL Ratio: 3
Triglycerides: 116 mg/dL (ref 0.0–149.0)
VLDL: 23.2 mg/dL (ref 0.0–40.0)

## 2023-06-15 LAB — CBC
HCT: 42.1 % (ref 36.0–46.0)
Hemoglobin: 14.1 g/dL (ref 12.0–15.0)
MCHC: 33.5 g/dL (ref 30.0–36.0)
MCV: 88.1 fl (ref 78.0–100.0)
Platelets: 263 10*3/uL (ref 150.0–400.0)
RBC: 4.78 Mil/uL (ref 3.87–5.11)
RDW: 14 % (ref 11.5–15.5)
WBC: 5.2 10*3/uL (ref 4.0–10.5)

## 2023-06-15 NOTE — Assessment & Plan Note (Signed)
 Better since daily yogurt Tums occasionally

## 2023-06-15 NOTE — Progress Notes (Signed)
 Subjective:    Patient ID: Jessica Sullivan, female    DOB: [birth year], 78 y.o.   MRN: [id number]  HPI Here for Medicare wellness visit and follow up of chronic health conditions Reviewed advanced directives Reviewed other doctors---Dr [facility] surgeon, Dr Sylvie Farrier, Dr Thomasene Lot, Dr Gigi Gin, Dr Willadean Carol Does walk regularly--though has slacked off some. Discussed resistance also No hospitalizations. Did have heat treatment/sclerotherapy on varicose veins Vision is fine Hearing is good Occasional alcohol No tobacco No falls No depression or anhedonia Independent with instrumental ADLs No memory problems  Had procedures on veins in [place] Far more effective than past therapy Very satisfied  Uncomfortable with hips at times Uses aleve rarely Hasn't been taking the tylenol Doesn't find it limiting  Uses clobetasol weekly for prevention for lichen sclerosis  Current Outpatient Medications on File Prior to Visit  Medication Sig Dispense Refill   Calcium Carbonate-Vitamin D (CALCIUM + D PO) Take 1 tablet by mouth daily.     clobetasol ointment (TEMOVATE) 0.05 % APPLY TOPICALLY TWICE A WEEK AS NEEDED (Patient taking differently: Apply 1 Application topically. APPLY TOPICALLY ONCE A WEEK AS NEEDED) 45 g 2   Multiple Vitamins-Minerals (CENTRUM SILVER ADULT 50+) TABS Take 1 tablet by mouth daily.     Multiple Vitamins-Minerals (ICAPS AREDS 2) CAPS Take 1 capsule by mouth 2 (two) times daily.     Omega-3 Fatty Acids (FISH OIL) 1000 MG CAPS Take 1 capsule by mouth daily.      No current facility-administered medications on file prior to visit.    Allergies  Allergen Reactions   Flavoring Agent (Non-Screening)     Only peach snapps   Oxycodone Other (See Comments)    Light headed.   Poison Ivy Extract Itching and Rash    Past Medical History:  Diagnosis Date   Chronic venous insufficiency    History of dysplastic nevus 06/07/2013    left post med thigh/moderate   Hyperlipidemia    Lichen sclerosus 01/08/2015   Vulvar biopsy-lichen sclerosus    Osteoarthritis of both knees 03/16/2017   Wears contact lenses     Past Surgical History:  Procedure Laterality Date   CATARACT EXTRACTION W/PHACO Left 04/27/2022   Procedure: CATARACT EXTRACTION PHACO AND INTRAOCULAR LENS PLACEMENT (IOC) LEFT CLAREON VIVITY toric lens 11.65 01:17.6;  Surgeon: Galen Manila, MD;  Location: [facility];  Service: Ophthalmology;  Laterality: Left;   CATARACT EXTRACTION W/PHACO Right 05/11/2022   Procedure: CATARACT EXTRACTION PHACO AND INTRAOCULAR LENS PLACEMENT (IOC) RIGHT CLAREON VIVITY TORIC LENS 9.98 01:03.7;  Surgeon: Galen Manila, MD;  Location: [facility];  Service: Ophthalmology;  Laterality: Right;   CESAREAN SECTION  1978   Chin lift  8/14   DILATION AND CURETTAGE OF UTERUS  1990   year ago- polyp   HAMMER TOE SURGERY  12/13   JOINT REPLACEMENT     KNEE ARTHROSCOPY Right 3/13   meniscus   MPJ Release 2nd toe left     TOTAL KNEE ARTHROPLASTY Right 10/11/2017   Procedure: TOTAL KNEE ARTHROPLASTY;  Surgeon: Kennedy Bucker, MD;  Location: [facility];  Service: Orthopedics;  Laterality: Right;   TOTAL KNEE ARTHROPLASTY Left 04/01/2020   Procedure: TOTAL KNEE ARTHROPLASTY;  Surgeon: Kennedy Bucker, MD;  Location: [facility];  Service: Orthopedics;  Laterality: Left;   VARICOSE VEIN SURGERY  2004, 2011    Family History  Problem Relation Age of Onset   Dementia Mother    Cancer Father    Cancer Brother  prostate/prostate   Dementia Brother        corticobasilar degeneration   Parkinson's disease Brother    Cancer Brother        prostate   Breast cancer Maternal Aunt    Breast cancer Cousin    Heart disease Neg Hx    Diabetes Neg Hx    Ovarian cancer Neg Hx    Colon cancer Neg Hx     Social History   Socioeconomic History   Marital status: Married    Spouse name: Not on file   Number of children: 1    Years of education: Not on file   Highest education level: Some college, no degree  Occupational History   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    Comment: briefly, 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  Tobacco Use   Smoking status: Never    Passive exposure: Past   Smokeless tobacco: Never  Vaping Use   Vaping status: Never Used  Substance and Sexual Activity   Alcohol use: Yes    Comment: once a week   Drug use: No   Sexual activity: Yes    Birth control/protection: Post-menopausal  Other Topics Concern   Not on file  Social History Narrative   Has living will   Husband is health care POA--- son is alternate   Would accept resuscitation attempts but no prolonged artificial ventilation   No tube feeds if cognitively unaware   Social Drivers of Health   Financial Resource Strain: Low Risk  (06/14/2023)   Overall Financial Resource Strain (CARDIA)    Difficulty of Paying Living Expenses: Not hard at all  Food Insecurity: No Food Insecurity (06/14/2023)   Hunger Vital Sign    Worried About Running Out of Food in the Last Year: Never true    Ran Out of Food in the Last Year: Never true  Transportation Needs: No Transportation Needs (06/14/2023)   PRAPARE - Administrator, Civil Service (Medical): No    Lack of Transportation (Non-Medical): No  Physical Activity: Unknown (06/14/2023)   Exercise Vital Sign    Days of Exercise per Week: 5 days    Minutes of Exercise per Session: Not on file  Stress: No Stress Concern Present (06/14/2023)   [employer] of Occupational Health - Occupational Stress Questionnaire    Feeling of Stress : Only a little  Social Connections: Socially Integrated (06/14/2023)   Social Connection and Isolation Panel [NHANES]    Frequency of Communication with Friends and Family: More than three times a week    Frequency of Social Gatherings with Friends and Family: Twice a week    Attends Religious Services: More than 4 times per year    Active  Member of [employer] or Organizations: Yes    Attends Banker Meetings: Never    Marital Status: Married  Catering manager Violence: Not on file   Review of Systems Appetite is good Weight is stable Sleeps okay---does have restless legs. Melatonin didn't help Wears seat belt Teeth okay--keeps up with dentist Past heartburn--better since having yogurt daily. Rare tums. No dysphagia Bowels move fine--no blood No urinary problems No chest pain or SOB No dizziness or syncope No palpitations    Objective:   Physical Exam Constitutional:      Appearance: Normal appearance.  HENT:     Mouth/Throat:     Pharynx: No oropharyngeal exudate or posterior oropharyngeal erythema.  Eyes:     Conjunctiva/sclera: Conjunctivae normal.  Pupils: Pupils are equal, round, and reactive to light.  Cardiovascular:     Rate and Rhythm: Normal rate and regular rhythm.     Pulses: Normal pulses.     Heart sounds: No murmur heard.    No gallop.  Pulmonary:     Effort: Pulmonary effort is normal.     Breath sounds: Normal breath sounds. No wheezing or rales.  Abdominal:     Palpations: Abdomen is soft.     Tenderness: There is no abdominal tenderness.  Musculoskeletal:     Cervical back: Neck supple.     Right lower leg: No edema.     Left lower leg: No edema.  Lymphadenopathy:     Cervical: No cervical adenopathy.  Skin:    Findings: No rash.  Neurological:     General: No focal deficit present.     Mental Status: She is alert and oriented to person, place, and time.     Comments: Mini--cog---normal  Psychiatric:        Mood and Affect: Mood normal.        Behavior: Behavior normal.            Assessment & Plan:

## 2023-06-15 NOTE — Addendum Note (Signed)
 Addended by: Tillman Abide I on: 06/15/2023 09:02 AM   Modules accepted: Orders

## 2023-06-15 NOTE — Progress Notes (Signed)
Hearing Screening - Comments:: Passed whisper test Vision Screening - Comments:: September 2024

## 2023-06-15 NOTE — Assessment & Plan Note (Signed)
 I have personally reviewed the Medicare Annual Wellness questionnaire and have noted 1. The patient's medical and social history 2. Their use of alcohol, tobacco or illicit drugs 3. Their current medications and supplements 4. The patient's functional ability including ADL's, fall risks, home safety risks and hearing or visual             impairment. 5. Diet and physical activities 6. Evidence for depression or mood disorders  The patients weight, height, BMI and visual acuity have been recorded in the chart I have made referrals, counseling and provided education to the patient based review of the above and I have provided the pt with a written personalized care plan for preventive services.  I have provided you with a copy of your personalized plan for preventive services. Please take the time to review along with your updated medication list.  Will do FIT again Mammograms every 1-2 years till 67 Needs to pick up on exercise--especially resistance Prefers no COVID vaccines Yearly flu vaccine

## 2023-06-15 NOTE — Assessment & Plan Note (Signed)
 Now more satisfied after last procedures

## 2023-06-15 NOTE — Assessment & Plan Note (Signed)
 Uses the clobetasol weekly as prevention

## 2023-06-16 ENCOUNTER — Other Ambulatory Visit (INDEPENDENT_AMBULATORY_CARE_PROVIDER_SITE_OTHER): Payer: Self-pay

## 2023-06-16 DIAGNOSIS — Z1211 Encounter for screening for malignant neoplasm of colon: Secondary | ICD-10-CM

## 2023-06-16 LAB — HEPATITIS C ANTIBODY: Hepatitis C Ab: NONREACTIVE

## 2023-06-17 ENCOUNTER — Encounter: Payer: Self-pay | Admitting: Internal Medicine

## 2023-06-17 LAB — FECAL OCCULT BLOOD, IMMUNOCHEMICAL: Fecal Occult Bld: NEGATIVE

## 2023-06-22 ENCOUNTER — Telehealth: Payer: Self-pay | Admitting: Internal Medicine

## 2023-06-22 NOTE — Telephone Encounter (Signed)
   Spoke with pt she was to transfer to the new provider due to her husband is transferring to the new doctor    Copied from CRM [id number]. Topic: General - Other >> Jun 22, 2023  1:30 PM Allyne Areola wrote: Reason for CRM: Patient was seen on 06/15/2023 with Dr.Letvak and informed that a new provider will be coming into the office, she was scheduled for a transfer of care on 06/18/2024 with Tabitha Dugal; however, she would like to establish care with the new provider when they join the office.

## 2023-06-27 ENCOUNTER — Other Ambulatory Visit: Payer: Self-pay | Admitting: Internal Medicine

## 2023-06-28 DIAGNOSIS — R252 Cramp and spasm: Secondary | ICD-10-CM | POA: Diagnosis not present

## 2023-06-28 DIAGNOSIS — L299 Pruritus, unspecified: Secondary | ICD-10-CM | POA: Diagnosis not present

## 2023-06-28 DIAGNOSIS — G2581 Restless legs syndrome: Secondary | ICD-10-CM | POA: Diagnosis not present

## 2023-06-28 DIAGNOSIS — I83893 Varicose veins of bilateral lower extremities with other complications: Secondary | ICD-10-CM | POA: Diagnosis not present

## 2023-08-15 ENCOUNTER — Other Ambulatory Visit: Payer: Self-pay | Admitting: Internal Medicine

## 2023-08-15 DIAGNOSIS — Z1231 Encounter for screening mammogram for malignant neoplasm of breast: Secondary | ICD-10-CM

## 2023-08-16 DIAGNOSIS — I83811 Varicose veins of right lower extremities with pain: Secondary | ICD-10-CM | POA: Diagnosis not present

## 2023-08-22 ENCOUNTER — Encounter: Payer: Self-pay | Admitting: Internal Medicine

## 2023-08-22 ENCOUNTER — Telehealth: Payer: Self-pay

## 2023-08-22 NOTE — Telephone Encounter (Signed)
 Ok with me

## 2023-08-22 NOTE — Telephone Encounter (Signed)
 Copied from CRM [id number]. Topic: Appointments - Transfer of Care >> Aug 22, 2023 11:10 AM Chuck Crater wrote: Pt is requesting to transfer FROM: Dr. Joelle Musca Pt is requesting to transfer TO: Dr. Geralyn Knee Reason for requested transfer: Dr. Joelle Musca is leaving  It is the responsibility of the team the patient would like to transfer to (Dr. Dr. Geralyn Knee) to reach out to the patient if for any reason this transfer is not acceptable.

## 2023-08-23 DIAGNOSIS — I83892 Varicose veins of left lower extremities with other complications: Secondary | ICD-10-CM | POA: Diagnosis not present

## 2023-08-23 NOTE — Telephone Encounter (Signed)
 transfer pt appt put in

## 2023-08-23 NOTE — Telephone Encounter (Signed)
Yes pt is aware

## 2023-08-30 DIAGNOSIS — I87391 Chronic venous hypertension (idiopathic) with other complications of right lower extremity: Secondary | ICD-10-CM | POA: Diagnosis not present

## 2023-09-01 ENCOUNTER — Ambulatory Visit
Admission: RE | Admit: 2023-09-01 | Discharge: 2023-09-01 | Disposition: A | Discharge status: Home / Self Care | Source: Ambulatory Visit | Attending: Internal Medicine | Admitting: Internal Medicine

## 2023-09-01 DIAGNOSIS — Z1231 Encounter for screening mammogram for malignant neoplasm of breast: Secondary | ICD-10-CM | POA: Diagnosis not present

## 2023-09-02 ENCOUNTER — Ambulatory Visit: Payer: Self-pay | Admitting: Internal Medicine

## 2023-09-06 DIAGNOSIS — I83892 Varicose veins of left lower extremities with other complications: Secondary | ICD-10-CM | POA: Diagnosis not present

## 2023-09-06 DIAGNOSIS — M7989 Other specified soft tissue disorders: Secondary | ICD-10-CM | POA: Diagnosis not present

## 2023-09-06 DIAGNOSIS — I83812 Varicose veins of left lower extremities with pain: Secondary | ICD-10-CM | POA: Diagnosis not present

## 2023-09-26 ENCOUNTER — Encounter: Payer: Self-pay | Admitting: Internal Medicine

## 2024-01-04 DIAGNOSIS — H43813 Vitreous degeneration, bilateral: Secondary | ICD-10-CM | POA: Diagnosis not present

## 2024-01-04 DIAGNOSIS — H353131 Nonexudative age-related macular degeneration, bilateral, early dry stage: Secondary | ICD-10-CM | POA: Diagnosis not present

## 2024-01-04 DIAGNOSIS — Z961 Presence of intraocular lens: Secondary | ICD-10-CM | POA: Diagnosis not present

## 2024-01-25 ENCOUNTER — Ambulatory Visit: Payer: Medicare HMO | Admitting: Dermatology

## 2024-01-25 DIAGNOSIS — Z1283 Encounter for screening for malignant neoplasm of skin: Secondary | ICD-10-CM | POA: Diagnosis not present

## 2024-01-25 DIAGNOSIS — I8390 Asymptomatic varicose veins of unspecified lower extremity: Secondary | ICD-10-CM

## 2024-01-25 DIAGNOSIS — L578 Other skin changes due to chronic exposure to nonionizing radiation: Secondary | ICD-10-CM

## 2024-01-25 DIAGNOSIS — L821 Other seborrheic keratosis: Secondary | ICD-10-CM

## 2024-01-25 DIAGNOSIS — L814 Other melanin hyperpigmentation: Secondary | ICD-10-CM

## 2024-01-25 DIAGNOSIS — Z86018 Personal history of other benign neoplasm: Secondary | ICD-10-CM | POA: Diagnosis not present

## 2024-01-25 DIAGNOSIS — L820 Inflamed seborrheic keratosis: Secondary | ICD-10-CM

## 2024-01-25 DIAGNOSIS — D692 Other nonthrombocytopenic purpura: Secondary | ICD-10-CM | POA: Diagnosis not present

## 2024-01-25 DIAGNOSIS — D1801 Hemangioma of skin and subcutaneous tissue: Secondary | ICD-10-CM | POA: Diagnosis not present

## 2024-01-25 DIAGNOSIS — W908XXA Exposure to other nonionizing radiation, initial encounter: Secondary | ICD-10-CM | POA: Diagnosis not present

## 2024-01-25 DIAGNOSIS — D229 Melanocytic nevi, unspecified: Secondary | ICD-10-CM

## 2024-01-25 NOTE — Patient Instructions (Addendum)
 Seborrheic Keratosis  What causes seborrheic keratoses? Seborrheic keratoses are harmless, common skin growths that first appear during adult life.  As time goes by, more growths appear.  Some people may develop a large number of them.  Seborrheic keratoses appear on both covered and uncovered body parts.  They are not caused by sunlight.  The tendency to develop seborrheic keratoses can be inherited.  They vary in color from skin-colored to gray, brown, or even black.  They can be either smooth or have a rough, warty surface.   Seborrheic keratoses are superficial and look as if they were stuck on the skin.  Under the microscope this type of keratosis looks like layers upon layers of skin.  That is why at times the top layer may seem to fall off, but the rest of the growth remains and re-grows.    Treatment Seborrheic keratoses do not need to be treated, but can easily be removed in the office.  Seborrheic keratoses often cause symptoms when they rub on clothing or jewelry.  Lesions can be in the way of shaving.  If they become inflamed, they can cause itching, soreness, or burning.  Removal of a seborrheic keratosis can be accomplished by freezing, burning, or surgery. If any spot bleeds, scabs, or grows rapidly, please return to have it checked, as these can be an indication of a skin cancer.   Cryotherapy Aftercare  Wash gently with soap and water everyday.   Apply Vaseline and Band-Aid daily until healed.     Melanoma ABCDEs  Melanoma is the most dangerous type of skin cancer, and is the leading cause of death from skin disease.  You are more likely to develop melanoma if you: Have light-colored skin, light-colored eyes, or red or blond hair Spend a lot of time in the sun Tan regularly, either outdoors or in a tanning bed Have had blistering sunburns, especially during childhood Have a close family member who has had a melanoma Have atypical moles or large birthmarks  Early detection  of melanoma is key since treatment is typically straightforward and cure rates are extremely high if we catch it early.   The first sign of melanoma is often a change in a mole or a new dark spot.  The ABCDE system is a way of remembering the signs of melanoma.  A for asymmetry:  The two halves do not match. B for border:  The edges of the growth are irregular. C for color:  A mixture of colors are present instead of an even brown color. D for diameter:  Melanomas are usually (but not always) greater than 6mm - the size of a pencil eraser. E for evolution:  The spot keeps changing in size, shape, and color.  Please check your skin once per month between visits. You can use a small mirror in front and a large mirror behind you to keep an eye on the back side or your body.   If you see any new or changing lesions before your next follow-up, please call to schedule a visit.  Please continue daily skin protection including broad spectrum sunscreen SPF 30+ to sun-exposed areas, reapplying every 2 hours as needed when you're outdoors.   Staying in the shade or wearing long sleeves, sun glasses (UVA+UVB protection) and wide brim hats (4-inch brim around the entire circumference of the hat) are also recommended for sun protection.     Due to recent changes in healthcare laws, you may see results of your  pathology and/or laboratory studies on MyChart before the doctors have had a chance to review them. We understand that in some cases there may be results that are confusing or concerning to you. Please understand that not all results are received at the same time and often the doctors may need to interpret multiple results in order to provide you with the best plan of care or course of treatment. Therefore, we ask that you please give us  2 business days to thoroughly review all your results before contacting the office for clarification. Should we see a critical lab result, you will be contacted  sooner.   If You Need Anything After Your Visit  If you have any questions or concerns for your doctor, please call our main line at [phone] and press option 4 to reach your doctor's medical assistant. If no one answers, please leave a voicemail as directed and we will return your call as soon as possible. Messages left after 4 pm will be answered the following business day.   You may also send us  a message via MyChart. We typically respond to MyChart messages within 1-2 business days.  For prescription refills, please ask your pharmacy to contact our office. Our fax number is [phone].  If you have an urgent issue when the clinic is closed that cannot wait until the next business day, you can page your doctor at the number below.    Please note that while we do our best to be available for urgent issues outside of office hours, we are not available 24/7.   If you have an urgent issue and are unable to reach us , you may choose to seek medical care at your doctor's office, retail clinic, urgent care center, or emergency room.  If you have a medical emergency, please immediately call 911 or go to the emergency department.  Pager Numbers  - Dr. Hester: [phone]  - Dr. Jackquline: [phone]  - Dr. Claudene: [phone]   - Dr. Raymund: [phone]  In the event of inclement weather, please call our main line at [phone] for an update on the status of any delays or closures.  Dermatology Medication Tips: Please keep the boxes that topical medications come in in order to help keep track of the instructions about where and how to use these. Pharmacies typically print the medication instructions only on the boxes and not directly on the medication tubes.   If your medication is too expensive, please contact our office at [phone] option 4 or send us  a message through MyChart.   We are unable to tell what your co-pay for medications will be in advance as this is  different depending on your insurance coverage. However, we may be able to find a substitute medication at lower cost or fill out paperwork to get insurance to cover a needed medication.   If a prior authorization is required to get your medication covered by your insurance company, please allow us  1-2 business days to complete this process.  Drug prices often vary depending on where the prescription is filled and some pharmacies may offer cheaper prices.  The website www.goodrx.com contains coupons for medications through different pharmacies. The prices here do not account for what the cost may be with help from insurance (it may be cheaper with your insurance), but the website can give you the price if you did not use any insurance.  - You can print the associated coupon and take it with your prescription to the pharmacy.  -  You may also stop by our office during regular business hours and pick up a GoodRx coupon card.  - If you need your prescription sent electronically to a different pharmacy, notify our office through [facility] or by phone at [phone] option 4.     Si Usted Necesita Algo Despus de Su Visita  Tambin puede enviarnos un mensaje a travs de Clinical cytogeneticist. Por lo general respondemos a los mensajes de MyChart en el transcurso de 1 a 2 das hbiles.  Para renovar recetas, por favor pida a su farmacia que se ponga en contacto con nuestra oficina. Randi lakes de fax es [place] [phone].  Si tiene un asunto urgente cuando la clnica est cerrada y que no puede esperar hasta el siguiente da hbil, puede llamar/localizar a su doctor(a) al nmero que aparece a continuacin.   Por favor, tenga en cuenta que aunque hacemos todo lo posible para estar disponibles para asuntos urgentes fuera del horario de [place], no estamos disponibles las 24 horas del da, los 7 [address]  [address] de la [place].   Si tiene un problema urgente y no puede comunicarse con nosotros, puede optar por buscar  atencin mdica  en el consultorio de su doctor(a), en una clnica privada, en un centro de atencin urgente o en una sala de emergencias.  Si tiene Engineer, drilling, por favor llame inmediatamente al 911 o vaya a la sala de emergencias.  Nmeros de bper  - Dr. Hester: [phone]  - Dra. Jackquline: [phone]  - Dr. Claudene: [phone]  - Dra. Kitts: [phone]  En caso de inclemencias del [place], por favor llame a nuestra lnea principal al [phone] para una actualizacin sobre el estado de cualquier retraso o cierre.  Consejos para la medicacin en dermatologa: Por favor, guarde las cajas en las que vienen los medicamentos de uso tpico para ayudarle a seguir las instrucciones sobre dnde y cmo usarlos. Las farmacias generalmente imprimen las instrucciones del medicamento slo en las cajas y no directamente en los tubos del [place].   Si su medicamento es muy caro, por favor, pngase en contacto con landry rieger llamando al [phone] y presione la opcin 4 o envenos un mensaje a travs de Clinical cytogeneticist.   No podemos decirle cul ser su copago por los medicamentos por adelantado ya que esto es diferente dependiendo de la cobertura de su seguro. Sin embargo, es posible que podamos encontrar un medicamento sustituto a Audiological scientist un formulario para que el seguro cubra el medicamento que se considera necesario.   Si se requiere una autorizacin previa para que su compaa de seguros malta su medicamento, por favor permtanos de 1 a 2 das hbiles para completar este proceso.  Los precios de los medicamentos varan con frecuencia dependiendo del Environmental consultant de dnde se surte la receta y alguna farmacias pueden ofrecer precios ms baratos.  El sitio web www.goodrx.com tiene cupones para medicamentos de Health and safety inspector. Los precios aqu no tienen en cuenta lo que podra costar con la ayuda del seguro (puede ser ms barato con su seguro), pero el sitio web puede  darle el precio si no utiliz Tourist information centre manager.  - Puede imprimir el cupn correspondiente y llevarlo con su receta a la farmacia.  - Tambin puede pasar por nuestra oficina durante el horario de atencin regular y Education officer, museum una tarjeta de cupones de GoodRx.  - Si necesita que su receta se enve electrnicamente a Psychiatrist, informe a nuestra oficina a travs de MyChart de [employer] o por  telfono llamando al [phone] y presione la opcin 4.

## 2024-01-25 NOTE — Progress Notes (Signed)
 Follow-Up Visit   Subjective  Jessica Sullivan is a 78 y.o. female who presents for the following: Skin Cancer Screening and Full Body Skin Exam Hx of dysplastic nevus and isks   Left great toeanial dystrophp from trauma at nail salon  May consider moleculart study If looking better will do nothing If not molecular study Advised patient to let grow Will recheck in 6 months  Photos today   The patient presents for Total-Body Skin Exam (TBSE) for skin cancer screening and mole check. The patient has spots, moles and lesions to be evaluated, some may be new or changing and the patient may have concern these could be cancer.  The following portions of the chart were reviewed this encounter and updated as appropriate: medications, allergies, medical history  Review of Systems:  No other skin or systemic complaints except as noted in HPI or Assessment and Plan.  Objective  Well appearing patient in no apparent distress; mood and affect are within normal limits.  A full examination was performed including scalp, head, eyes, ears, nose, lips, neck, chest, axillae, abdomen, back, buttocks, bilateral upper extremities, bilateral lower extremities, hands, feet, fingers, toes, fingernails, and toenails. All findings within normal limits unless otherwise noted below.   Relevant physical exam findings are noted in the Assessment and Plan.  back x 16 (16) Erythematous stuck-on, waxy papule or plaque  Assessment & Plan   HISTORY OF DYSPLASTIC NEVUS. Left posterior medial thigh, moderate atypia, 06/07/2013. No evidence of recurrence today Recommend regular full body skin exams Recommend daily broad spectrum sunscreen SPF 30+ to sun-exposed areas, reapply every 2 hours as needed.  Call if any new or changing lesions are noted between office visits   SKIN CANCER SCREENING PERFORMED TODAY.  ACTINIC DAMAGE - Chronic condition, secondary to cumulative UV/sun exposure - diffuse scaly  erythematous macules with underlying dyspigmentation - Recommend daily broad spectrum sunscreen SPF 30+ to sun-exposed areas, reapply every 2 hours as needed.  - Staying in the shade or wearing long sleeves, sun glasses (UVA+UVB protection) and wide brim hats (4-inch brim around the entire circumference of the hat) are also recommended for sun protection.  - Call for new or changing lesions.  LENTIGINES, SEBORRHEIC KERATOSES, HEMANGIOMAS - Benign normal skin lesions - Benign-appearing - Call for any changes  MELANOCYTIC NEVI - Tan-brown and/or pink-flesh-colored symmetric macules and papules - Benign appearing on exam today - Observation - Call clinic for new or changing moles - Recommend daily use of broad spectrum spf 30+ sunscreen to sun-exposed areas.   Varicose Veins/Spider Veins - Dilated blue, purple or red veins at the lower extremities - Reassured - Smaller vessels can be treated by sclerotherapy (a procedure to inject a medicine into the veins to make them disappear) if desired, but the treatment is not covered by insurance. Larger vessels may be covered if symptomatic and we would refer to vascular surgeon if treatment desired.  Purpura - Chronic; persistent and recurrent.  Treatable, but not curable. At arms - Violaceous macules and patches - Benign - Related to trauma, age, sun damage and/or use of blood thinners, chronic use of topical and/or oral steroids - Observe - Can use OTC arnica containing moisturizer such as Dermend Bruise Formula if desired - Call for worsening or other concerns  INFLAMED SEBORRHEIC KERATOSIS (16) back x 16 (16) Symptomatic, irritating, patient would like treated. Destruction of lesion - back x 16 (16) Complexity: simple   Destruction method: cryotherapy   Informed consent: discussed  and consent obtained   Timeout:  patient name, date of birth, surgical site, and procedure verified Lesion destroyed using liquid nitrogen: Yes   Region  frozen until ice ball extended beyond lesion: Yes   Outcome: patient tolerated procedure well with no complications   Post-procedure details: wound care instructions given    Return in about 6 months (around 07/24/2024) for recheck isks and left great toe, 1 year tbse hx of dysplastic .  IEleanor Blush, CMA, am acting as scribe for Alm Rhyme, MD.   Documentation: I have reviewed the above documentation for accuracy and completeness, and I agree with the above.  Alm Rhyme, MD

## 2024-01-29 ENCOUNTER — Encounter: Payer: Self-pay | Admitting: Dermatology

## 2024-02-06 DIAGNOSIS — R252 Cramp and spasm: Secondary | ICD-10-CM | POA: Diagnosis not present

## 2024-02-06 DIAGNOSIS — G2581 Restless legs syndrome: Secondary | ICD-10-CM | POA: Diagnosis not present

## 2024-02-06 DIAGNOSIS — I872 Venous insufficiency (chronic) (peripheral): Secondary | ICD-10-CM | POA: Diagnosis not present

## 2024-02-06 DIAGNOSIS — I83893 Varicose veins of bilateral lower extremities with other complications: Secondary | ICD-10-CM | POA: Diagnosis not present

## 2024-02-06 DIAGNOSIS — L299 Pruritus, unspecified: Secondary | ICD-10-CM | POA: Diagnosis not present

## 2024-06-18 ENCOUNTER — Encounter: Admitting: Family

## 2024-06-21 ENCOUNTER — Encounter: Admitting: Family Medicine

## 2024-07-02 ENCOUNTER — Encounter: Admitting: Family Medicine

## 2024-08-21 ENCOUNTER — Ambulatory Visit: Admitting: Dermatology

## 2025-02-05 ENCOUNTER — Ambulatory Visit: Admitting: Dermatology

## 2025-03-12 ENCOUNTER — Ambulatory Visit: Admitting: Dermatology
